# Patient Record
Sex: Male | Born: 1950 | Race: White | Hispanic: No | Marital: Married | Smoking: Never smoker
Health system: Southern US, Community
[De-identification: ages and names within clinical notes are randomized; demographics above are authoritative.]

## PROBLEM LIST (undated history)

## (undated) DIAGNOSIS — F329 Major depressive disorder, single episode, unspecified: Secondary | ICD-10-CM

## (undated) DIAGNOSIS — I340 Nonrheumatic mitral (valve) insufficiency: Secondary | ICD-10-CM

## (undated) DIAGNOSIS — M542 Cervicalgia: Secondary | ICD-10-CM

## (undated) DIAGNOSIS — D649 Anemia, unspecified: Secondary | ICD-10-CM

## (undated) DIAGNOSIS — K589 Irritable bowel syndrome without diarrhea: Secondary | ICD-10-CM

## (undated) DIAGNOSIS — K219 Gastro-esophageal reflux disease without esophagitis: Secondary | ICD-10-CM

## (undated) DIAGNOSIS — A419 Sepsis, unspecified organism: Secondary | ICD-10-CM

## (undated) DIAGNOSIS — K449 Diaphragmatic hernia without obstruction or gangrene: Secondary | ICD-10-CM

## (undated) DIAGNOSIS — K649 Unspecified hemorrhoids: Secondary | ICD-10-CM

## (undated) DIAGNOSIS — K648 Other hemorrhoids: Secondary | ICD-10-CM

## (undated) DIAGNOSIS — K2970 Gastritis, unspecified, without bleeding: Secondary | ICD-10-CM

## (undated) DIAGNOSIS — R6521 Severe sepsis with septic shock: Secondary | ICD-10-CM

## (undated) DIAGNOSIS — M109 Gout, unspecified: Secondary | ICD-10-CM

## (undated) DIAGNOSIS — Z952 Presence of prosthetic heart valve: Secondary | ICD-10-CM

## (undated) DIAGNOSIS — D369 Benign neoplasm, unspecified site: Secondary | ICD-10-CM

## (undated) DIAGNOSIS — F32A Depression, unspecified: Secondary | ICD-10-CM

## (undated) DIAGNOSIS — G4730 Sleep apnea, unspecified: Secondary | ICD-10-CM

## (undated) HISTORY — DX: Gastro-esophageal reflux disease without esophagitis: K21.9

## (undated) HISTORY — DX: Other hemorrhoids: K64.8

## (undated) HISTORY — DX: Gastritis, unspecified, without bleeding: K29.70

## (undated) HISTORY — PX: CORONARY ANGIOPLASTY: SHX604

## (undated) HISTORY — DX: Sepsis, unspecified organism: A41.9

## (undated) HISTORY — PX: GALLBLADDER SURGERY: SHX652

## (undated) HISTORY — DX: Depression, unspecified: F32.A

## (undated) HISTORY — DX: Nonrheumatic mitral (valve) insufficiency: I34.0

## (undated) HISTORY — DX: Diaphragmatic hernia without obstruction or gangrene: K44.9

## (undated) HISTORY — DX: Cervicalgia: M54.2

## (undated) HISTORY — DX: Irritable bowel syndrome, unspecified: K58.9

## (undated) HISTORY — DX: Benign neoplasm, unspecified site: D36.9

## (undated) HISTORY — DX: Severe sepsis with septic shock: R65.21

## (undated) HISTORY — DX: Presence of prosthetic heart valve: Z95.2

## (undated) HISTORY — DX: Unspecified hemorrhoids: K64.9

## (undated) HISTORY — DX: Gout, unspecified: M10.9

## (undated) HISTORY — PX: NECK SURGERY: SHX720

## (undated) HISTORY — DX: Anemia, unspecified: D64.9

## (undated) HISTORY — DX: Major depressive disorder, single episode, unspecified: F32.9

---

## 2000-01-08 HISTORY — PX: CARDIAC VALVE REPLACEMENT: SHX585

## 2000-01-08 HISTORY — PX: AORTIC VALVE REPLACEMENT AND MITRAL VALVE REPAIR: SHX5057

## 2005-08-18 ENCOUNTER — Ambulatory Visit: Payer: Self-pay | Admitting: Internal Medicine

## 2006-02-01 ENCOUNTER — Ambulatory Visit: Payer: Self-pay | Admitting: Internal Medicine

## 2006-02-14 ENCOUNTER — Inpatient Hospital Stay (HOSPITAL_COMMUNITY): Admission: AD | Admit: 2006-02-14 | Discharge: 2006-02-27 | Payer: Self-pay

## 2006-02-17 ENCOUNTER — Ambulatory Visit: Payer: Self-pay | Admitting: Internal Medicine

## 2006-02-17 HISTORY — PX: COLONOSCOPY: SHX174

## 2006-02-17 HISTORY — PX: ESOPHAGOGASTRODUODENOSCOPY: SHX1529

## 2006-02-18 ENCOUNTER — Encounter (INDEPENDENT_AMBULATORY_CARE_PROVIDER_SITE_OTHER): Payer: Self-pay | Admitting: Specialist

## 2009-01-07 ENCOUNTER — Encounter: Payer: Self-pay | Admitting: Internal Medicine

## 2009-01-17 DIAGNOSIS — I38 Endocarditis, valve unspecified: Secondary | ICD-10-CM | POA: Insufficient documentation

## 2009-01-17 DIAGNOSIS — E785 Hyperlipidemia, unspecified: Secondary | ICD-10-CM

## 2009-01-17 DIAGNOSIS — Z8719 Personal history of other diseases of the digestive system: Secondary | ICD-10-CM

## 2009-01-17 DIAGNOSIS — M1990 Unspecified osteoarthritis, unspecified site: Secondary | ICD-10-CM | POA: Insufficient documentation

## 2009-01-17 DIAGNOSIS — K219 Gastro-esophageal reflux disease without esophagitis: Secondary | ICD-10-CM

## 2009-01-17 DIAGNOSIS — I10 Essential (primary) hypertension: Secondary | ICD-10-CM | POA: Insufficient documentation

## 2009-01-17 DIAGNOSIS — K589 Irritable bowel syndrome without diarrhea: Secondary | ICD-10-CM

## 2009-01-23 ENCOUNTER — Ambulatory Visit: Payer: Self-pay | Admitting: Internal Medicine

## 2009-01-23 DIAGNOSIS — Z860100 Personal history of colon polyps, unspecified: Secondary | ICD-10-CM | POA: Insufficient documentation

## 2009-01-24 ENCOUNTER — Telehealth (INDEPENDENT_AMBULATORY_CARE_PROVIDER_SITE_OTHER): Payer: Self-pay

## 2009-01-24 ENCOUNTER — Encounter: Payer: Self-pay | Admitting: Internal Medicine

## 2009-01-27 ENCOUNTER — Telehealth (INDEPENDENT_AMBULATORY_CARE_PROVIDER_SITE_OTHER): Payer: Self-pay | Admitting: *Deleted

## 2009-01-28 ENCOUNTER — Encounter: Payer: Self-pay | Admitting: Internal Medicine

## 2009-01-28 ENCOUNTER — Inpatient Hospital Stay (HOSPITAL_COMMUNITY): Admission: AD | Admit: 2009-01-28 | Discharge: 2009-02-03 | Payer: Self-pay | Admitting: General Surgery

## 2009-01-28 ENCOUNTER — Ambulatory Visit: Payer: Self-pay | Admitting: Internal Medicine

## 2009-01-28 HISTORY — PX: COLONOSCOPY: SHX174

## 2009-01-28 HISTORY — PX: ESOPHAGOGASTRODUODENOSCOPY: SHX1529

## 2009-01-29 ENCOUNTER — Encounter (INDEPENDENT_AMBULATORY_CARE_PROVIDER_SITE_OTHER): Payer: Self-pay | Admitting: General Surgery

## 2009-01-30 ENCOUNTER — Encounter (INDEPENDENT_AMBULATORY_CARE_PROVIDER_SITE_OTHER): Payer: Self-pay

## 2009-01-30 ENCOUNTER — Encounter: Payer: Self-pay | Admitting: Internal Medicine

## 2009-02-03 ENCOUNTER — Telehealth (INDEPENDENT_AMBULATORY_CARE_PROVIDER_SITE_OTHER): Payer: Self-pay

## 2009-02-05 ENCOUNTER — Encounter: Payer: Self-pay | Admitting: Internal Medicine

## 2009-02-14 ENCOUNTER — Ambulatory Visit: Payer: Self-pay | Admitting: Pulmonary Disease

## 2009-02-14 ENCOUNTER — Inpatient Hospital Stay (HOSPITAL_COMMUNITY): Admission: AD | Admit: 2009-02-14 | Discharge: 2009-02-27 | Payer: Self-pay | Admitting: Internal Medicine

## 2009-02-21 ENCOUNTER — Ambulatory Visit: Payer: Self-pay | Admitting: Internal Medicine

## 2009-02-21 ENCOUNTER — Encounter (INDEPENDENT_AMBULATORY_CARE_PROVIDER_SITE_OTHER): Payer: Self-pay | Admitting: Internal Medicine

## 2009-02-25 ENCOUNTER — Encounter (INDEPENDENT_AMBULATORY_CARE_PROVIDER_SITE_OTHER): Payer: Self-pay | Admitting: Cardiology

## 2009-03-06 DIAGNOSIS — A419 Sepsis, unspecified organism: Secondary | ICD-10-CM

## 2009-03-06 HISTORY — DX: Sepsis, unspecified organism: A41.9

## 2009-03-11 ENCOUNTER — Encounter: Payer: Self-pay | Admitting: Internal Medicine

## 2009-03-18 ENCOUNTER — Telehealth (INDEPENDENT_AMBULATORY_CARE_PROVIDER_SITE_OTHER): Payer: Self-pay

## 2009-04-02 ENCOUNTER — Ambulatory Visit: Payer: Self-pay | Admitting: Internal Medicine

## 2009-04-02 DIAGNOSIS — Z9089 Acquired absence of other organs: Secondary | ICD-10-CM | POA: Insufficient documentation

## 2009-04-02 DIAGNOSIS — A419 Sepsis, unspecified organism: Secondary | ICD-10-CM | POA: Insufficient documentation

## 2009-04-02 DIAGNOSIS — L02838 Carbuncle of other sites: Secondary | ICD-10-CM

## 2009-04-02 DIAGNOSIS — L02828 Furuncle of other sites: Secondary | ICD-10-CM

## 2009-04-02 DIAGNOSIS — R6521 Severe sepsis with septic shock: Secondary | ICD-10-CM

## 2009-04-02 DIAGNOSIS — Z9889 Other specified postprocedural states: Secondary | ICD-10-CM | POA: Insufficient documentation

## 2009-07-24 ENCOUNTER — Telehealth (INDEPENDENT_AMBULATORY_CARE_PROVIDER_SITE_OTHER): Payer: Self-pay

## 2010-07-28 NOTE — Progress Notes (Signed)
Summary: requesting different PPI  Phone Note Call from Patient   Caller: Patient Summary of Call: Pt called and said he never had the Pantoprazole filled, he kept getting Protonix from his PCP. Now his insurance will cover the Pantoprazole but it has about a $45.00 co-pay. He will check and see if the pharmacy still has the Rx on profile. But he also wanted to know if there was another PPI that he could try that would be OK with him, given his specific problems of the esophagus.  Initial call taken by: Cloria Spring LPN,  July 24, 2009 2:27 PM     Appended Document: requesting different PPI He can call his insurance company to find out which PPI they prefer & how much it will cost him.  Would not use omeprazole bc he is on coumadin.  Appended Document: requesting different PPI Pt informed.  Appended Document: requesting different PPI Pt called back, said he is going with the generic Protonix and he will be getting it at presence from his PCP. He will call if there is any problems.

## 2010-10-02 LAB — PROTIME-INR
INR: 3.3 — ABNORMAL HIGH (ref 0.00–1.49)
Prothrombin Time: 34.9 seconds — ABNORMAL HIGH (ref 11.6–15.2)

## 2010-10-03 LAB — WOUND CULTURE: Culture: NO GROWTH

## 2010-10-03 LAB — BASIC METABOLIC PANEL
BUN: 12 mg/dL (ref 6–23)
BUN: 23 mg/dL (ref 6–23)
BUN: 7 mg/dL (ref 6–23)
BUN: 8 mg/dL (ref 6–23)
BUN: 10 mg/dL (ref 6–23)
BUN: 18 mg/dL (ref 6–23)
CO2: 22 mEq/L (ref 19–32)
CO2: 27 mEq/L (ref 19–32)
CO2: 29 mEq/L (ref 19–32)
CO2: 32 mEq/L (ref 19–32)
CO2: 33 mEq/L — ABNORMAL HIGH (ref 19–32)
CO2: 26 mEq/L (ref 19–32)
Calcium: 8.6 mg/dL (ref 8.4–10.5)
Calcium: 8.6 mg/dL (ref 8.4–10.5)
Calcium: 9.1 mg/dL (ref 8.4–10.5)
Calcium: 8.8 mg/dL (ref 8.4–10.5)
Calcium: 9.9 mg/dL (ref 8.4–10.5)
Chloride: 101 mEq/L (ref 96–112)
Chloride: 103 mEq/L (ref 96–112)
Chloride: 99 mEq/L (ref 96–112)
Creatinine, Ser: 0.75 mg/dL (ref 0.4–1.5)
Creatinine, Ser: 0.9 mg/dL (ref 0.4–1.5)
Creatinine, Ser: 0.73 mg/dL (ref 0.4–1.5)
Creatinine, Ser: 0.81 mg/dL (ref 0.4–1.5)
GFR calc Af Amer: >60 mL/min (ref >60)
GFR calc Af Amer: >60 mL/min (ref >60)
GFR calc Af Amer: >60 mL/min (ref >60)
GFR calc non Af Amer: >60 mL/min (ref >60)
GFR calc non Af Amer: >60 mL/min (ref >60)
GFR calc non Af Amer: >60 mL/min (ref >60)
GFR calc non Af Amer: >60 mL/min (ref >60)
GFR calc non Af Amer: >60 mL/min (ref >60)
GFR calc non Af Amer: >60 mL/min (ref >60)
GFR calc non Af Amer: >60 mL/min (ref >60)
GFR calc non Af Amer: >60 mL/min (ref >60)
Glucose, Bld: 100 mg/dL — ABNORMAL HIGH (ref 70–99)
Glucose, Bld: 106 mg/dL — ABNORMAL HIGH (ref 70–99)
Glucose, Bld: 106 mg/dL — ABNORMAL HIGH (ref 70–99)
Glucose, Bld: 110 mg/dL — ABNORMAL HIGH (ref 70–99)
Glucose, Bld: 92 mg/dL (ref 70–99)
Glucose, Bld: 96 mg/dL (ref 70–99)
Glucose, Bld: 98 mg/dL (ref 70–99)
Glucose, Bld: 102 mg/dL — ABNORMAL HIGH (ref 70–99)
Glucose, Bld: 94 mg/dL (ref 70–99)
Potassium: 3.4 mEq/L — ABNORMAL LOW (ref 3.5–5.1)
Potassium: 3.4 mEq/L — ABNORMAL LOW (ref 3.5–5.1)
Potassium: 3.6 mEq/L (ref 3.5–5.1)
Potassium: 3.8 mEq/L (ref 3.5–5.1)
Potassium: 3.9 mEq/L (ref 3.5–5.1)
Sodium: 137 mEq/L (ref 135–145)
Sodium: 138 mEq/L (ref 135–145)
Sodium: 139 mEq/L (ref 135–145)
Sodium: 141 mEq/L (ref 135–145)
Sodium: 142 mEq/L (ref 135–145)
Sodium: 144 mEq/L (ref 135–145)
Sodium: 143 mEq/L (ref 135–145)

## 2010-10-03 LAB — HEPARIN LEVEL (UNFRACTIONATED)
Heparin Unfractionated: 0.12 IU/mL — ABNORMAL LOW (ref 0.30–0.70)
Heparin Unfractionated: 0.17 IU/mL — ABNORMAL LOW (ref 0.30–0.70)
Heparin Unfractionated: 0.31 IU/mL (ref 0.30–0.70)
Heparin Unfractionated: 1.01 IU/mL — ABNORMAL HIGH (ref 0.30–0.70)

## 2010-10-03 LAB — DIFFERENTIAL
Basophils Absolute: 0 10*3/uL (ref 0.0–0.1)
Basophils Absolute: 0 10*3/uL (ref 0.0–0.1)
Basophils Absolute: 0 10*3/uL (ref 0.0–0.1)
Basophils Absolute: 0 10*3/uL (ref 0.0–0.1)
Basophils Absolute: 0 10*3/uL (ref 0.0–0.1)
Basophils Absolute: 0 10*3/uL (ref 0.0–0.1)
Basophils Relative: 0 % (ref 0–1)
Basophils Relative: 0 % (ref 0–1)
Basophils Relative: 0 % (ref 0–1)
Basophils Relative: 0 % (ref 0–1)
Basophils Relative: 0 % (ref 0–1)
Basophils Relative: 0 % (ref 0–1)
Eosinophils Absolute: 0.2 10*3/uL (ref 0.0–0.7)
Eosinophils Absolute: 0.2 10*3/uL (ref 0.0–0.7)
Eosinophils Relative: 2 % (ref 0–5)
Eosinophils Relative: 3 % (ref 0–5)
Lymphocytes Relative: 15 % (ref 12–46)
Lymphocytes Relative: 18 % (ref 12–46)
Lymphocytes Relative: 30 % (ref 12–46)
Monocytes Absolute: 0.5 10*3/uL (ref 0.1–1.0)
Monocytes Absolute: 0.6 10*3/uL (ref 0.1–1.0)
Monocytes Absolute: 0.7 10*3/uL (ref 0.1–1.0)
Monocytes Relative: 8 % (ref 3–12)
Monocytes Relative: 8 % (ref 3–12)
Neutro Abs: 4.3 10*3/uL (ref 1.7–7.7)
Neutro Abs: 4.3 10*3/uL (ref 1.7–7.7)
Neutro Abs: 5.2 10*3/uL (ref 1.7–7.7)
Neutro Abs: 5.4 10*3/uL (ref 1.7–7.7)
Neutro Abs: 6.3 10*3/uL (ref 1.7–7.7)
Neutrophils Relative %: 66 % (ref 43–77)
Neutrophils Relative %: 67 % (ref 43–77)
Neutrophils Relative %: 74 % (ref 43–77)
Neutrophils Relative %: 78 % — ABNORMAL HIGH (ref 43–77)

## 2010-10-03 LAB — CBC
HCT: 27.3 % — ABNORMAL LOW (ref 39.0–52.0)
HCT: 29.9 % — ABNORMAL LOW (ref 39.0–52.0)
HCT: 31.2 % — ABNORMAL LOW (ref 39.0–52.0)
HCT: 31.4 % — ABNORMAL LOW (ref 39.0–52.0)
HCT: 32.1 % — ABNORMAL LOW (ref 39.0–52.0)
HCT: 32 % — ABNORMAL LOW (ref 39.0–52.0)
HCT: 32.8 % — ABNORMAL LOW (ref 39.0–52.0)
Hemoglobin: 10.2 g/dL — ABNORMAL LOW (ref 13.0–17.0)
Hemoglobin: 10.4 g/dL — ABNORMAL LOW (ref 13.0–17.0)
Hemoglobin: 10.6 g/dL — ABNORMAL LOW (ref 13.0–17.0)
Hemoglobin: 10.7 g/dL — ABNORMAL LOW (ref 13.0–17.0)
Hemoglobin: 11.2 g/dL — ABNORMAL LOW (ref 13.0–17.0)
Hemoglobin: 9.4 g/dL — ABNORMAL LOW (ref 13.0–17.0)
Hemoglobin: 11.4 g/dL — ABNORMAL LOW (ref 13.0–17.0)
MCHC: 33.2 g/dL (ref 30.0–36.0)
MCHC: 33.3 g/dL (ref 30.0–36.0)
MCHC: 33.4 g/dL (ref 30.0–36.0)
MCHC: 33.8 g/dL (ref 30.0–36.0)
MCHC: 34 g/dL (ref 30.0–36.0)
MCHC: 34.9 g/dL (ref 30.0–36.0)
MCHC: 34.8 g/dL (ref 30.0–36.0)
MCHC: 34.9 g/dL (ref 30.0–36.0)
MCHC: 35.2 g/dL (ref 30.0–36.0)
MCHC: 35.5 g/dL (ref 30.0–36.0)
MCHC: 35.7 g/dL (ref 30.0–36.0)
MCHC: 35.8 g/dL (ref 30.0–36.0)
MCV: 85.8 fL (ref 78.0–100.0)
MCV: 85.9 fL (ref 78.0–100.0)
MCV: 86.2 fL (ref 78.0–100.0)
MCV: 86.6 fL (ref 78.0–100.0)
MCV: 87.9 fL (ref 78.0–100.0)
Platelets: 294 10*3/uL (ref 150–400)
Platelets: 345 10*3/uL (ref 150–400)
Platelets: 348 10*3/uL (ref 150–400)
Platelets: 349 10*3/uL (ref 150–400)
Platelets: 383 10*3/uL (ref 150–400)
Platelets: 178 10*3/uL (ref 150–400)
Platelets: 190 10*3/uL (ref 150–400)
Platelets: 195 10*3/uL (ref 150–400)
Platelets: 264 10*3/uL (ref 150–400)
RBC: 3.41 MIL/uL — ABNORMAL LOW (ref 4.22–5.81)
RBC: 3.74 MIL/uL — ABNORMAL LOW (ref 4.22–5.81)
RBC: 3.22 MIL/uL — ABNORMAL LOW (ref 4.22–5.81)
RBC: 3.57 MIL/uL — ABNORMAL LOW (ref 4.22–5.81)
RDW: 13.9 % (ref 11.5–15.5)
RDW: 14.4 % (ref 11.5–15.5)
RDW: 14.6 % (ref 11.5–15.5)
RDW: 14.7 % (ref 11.5–15.5)
RDW: 14.7 % (ref 11.5–15.5)
RDW: 14.9 % (ref 11.5–15.5)
RDW: 13.9 % (ref 11.5–15.5)
RDW: 14 % (ref 11.5–15.5)
RDW: 14 % (ref 11.5–15.5)
RDW: 14 % (ref 11.5–15.5)
RDW: 14.1 % (ref 11.5–15.5)
RDW: 14.2 % (ref 11.5–15.5)
WBC: 11 10*3/uL — ABNORMAL HIGH (ref 4.0–10.5)
WBC: 7.6 10*3/uL (ref 4.0–10.5)
WBC: 9 10*3/uL (ref 4.0–10.5)
WBC: 9.3 10*3/uL (ref 4.0–10.5)

## 2010-10-03 LAB — BLOOD GAS, ARTERIAL
Acid-base deficit: 0.8 mmol/L (ref 0.0–2.0)
Drawn by: 296031
pCO2 arterial: 32.1 mmHg — ABNORMAL LOW (ref 35.0–45.0)
pH, Arterial: 7.46 — ABNORMAL HIGH (ref 7.350–7.450)
pO2, Arterial: 85.7 mmHg (ref 80.0–100.0)

## 2010-10-03 LAB — HEPATIC FUNCTION PANEL
ALT: 27 U/L (ref 0–53)
AST: 16 U/L (ref 0–37)
AST: 29 U/L (ref 0–37)
Albumin: 2.9 g/dL — ABNORMAL LOW (ref 3.5–5.2)
Bilirubin, Direct: 0.1 mg/dL (ref 0.0–0.3)
Total Protein: 6.5 g/dL (ref 6.0–8.3)
Total Protein: 7.9 g/dL (ref 6.0–8.3)

## 2010-10-03 LAB — TYPE AND SCREEN
ABO/RH(D): O POS
Antibody Screen: NEGATIVE

## 2010-10-03 LAB — PROTIME-INR
INR: 2.8 — ABNORMAL HIGH (ref 0.00–1.49)
INR: 3 — ABNORMAL HIGH (ref 0.00–1.49)
INR: 3.2 — ABNORMAL HIGH (ref 0.00–1.49)
INR: 3.3 — ABNORMAL HIGH (ref 0.00–1.49)
INR: 3.6 — ABNORMAL HIGH (ref 0.00–1.49)
INR: 3.7 — ABNORMAL HIGH (ref 0.00–1.49)
INR: 3.7 — ABNORMAL HIGH (ref 0.00–1.49)
INR: 1.1 (ref 0.00–1.49)
INR: 1.4 (ref 0.00–1.49)
INR: 1.7 — ABNORMAL HIGH (ref 0.00–1.49)
INR: 2 — ABNORMAL HIGH (ref 0.00–1.49)
Prothrombin Time: 26.3 seconds — ABNORMAL HIGH (ref 11.6–15.2)
Prothrombin Time: 29.5 seconds — ABNORMAL HIGH (ref 11.6–15.2)
Prothrombin Time: 32.8 seconds — ABNORMAL HIGH (ref 11.6–15.2)
Prothrombin Time: 33.6 seconds — ABNORMAL HIGH (ref 11.6–15.2)
Prothrombin Time: 35.9 seconds — ABNORMAL HIGH (ref 11.6–15.2)
Prothrombin Time: 17.8 seconds — ABNORMAL HIGH (ref 11.6–15.2)
Prothrombin Time: 22.8 seconds — ABNORMAL HIGH (ref 11.6–15.2)

## 2010-10-03 LAB — LACTIC ACID, PLASMA: Lactic Acid, Venous: 1.1 mmol/L (ref 0.5–2.2)

## 2010-10-03 LAB — MAGNESIUM: Magnesium: 2 mg/dL (ref 1.5–2.5)

## 2010-10-03 LAB — D-DIMER, QUANTITATIVE: D-Dimer, Quant: 1.62 ug/mL-FEU — ABNORMAL HIGH (ref 0.00–0.48)

## 2010-10-03 LAB — CARDIAC PANEL(CRET KIN+CKTOT+MB+TROPI)
Relative Index: INVALID (ref 0.0–2.5)
Relative Index: INVALID (ref 0.0–2.5)
Total CK: 13 U/L (ref 7–232)
Total CK: 50 U/L (ref 7–232)
Troponin I: 0.03 ng/mL (ref 0.00–0.06)
Troponin I: 0.04 ng/mL (ref 0.00–0.06)

## 2010-10-03 LAB — CULTURE, BLOOD (ROUTINE X 2)
Culture: NO GROWTH
Culture: NO GROWTH

## 2010-10-03 LAB — COMPREHENSIVE METABOLIC PANEL
Albumin: 3.6 g/dL (ref 3.5–5.2)
BUN: 8 mg/dL (ref 6–23)
Creatinine, Ser: 0.8 mg/dL (ref 0.4–1.5)
Glucose, Bld: 107 mg/dL — ABNORMAL HIGH (ref 70–99)
Total Protein: 7.6 g/dL (ref 6.0–8.3)

## 2010-10-03 LAB — PHOSPHORUS: Phosphorus: 3.6 mg/dL (ref 2.3–4.6)

## 2010-10-03 LAB — ABO/RH: ABO/RH(D): O POS

## 2010-10-03 LAB — VANCOMYCIN, TROUGH
Vancomycin Tr: 24.1 ug/mL — ABNORMAL HIGH (ref 10.0–20.0)
Vancomycin Tr: 8.7 ug/mL — ABNORMAL LOW (ref 10.0–20.0)

## 2010-10-03 LAB — BRAIN NATRIURETIC PEPTIDE: Pro B Natriuretic peptide (BNP): 836 pg/mL — ABNORMAL HIGH (ref 0.0–100.0)

## 2010-10-03 LAB — APTT: aPTT: 33 seconds (ref 24–37)

## 2010-11-10 NOTE — Discharge Summary (Signed)
Lucas Bowman, Lucas Bowman             ACCOUNT NO.:  [id number]   MEDICAL RECORD NO.:  [id number]          PATIENT TYPE:  INP   LOCATION:  4710                         FACILITY:  [facility]   PHYSICIAN:  Altha Harm, MDDATE OF BIRTH:  September 13, 1950   DATE OF ADMISSION:  02/14/2009  DATE OF DISCHARGE:  02/27/2009                               DISCHARGE SUMMARY   ADDENDUM   The patient's discharge was held yesterday as the home health agency was  unable to provide IV infusion services for his IV antibiotics last  night.  This has been arranged to occur today and the patient is being  discharged home on vancomycin and rifampin for an additional 7 days.   1. Chronic anticoagulation.  The patient's INR today is 3.3 and the      patient will take 5 mg of Coumadin tonight and have his INR checked      tomorrow.  He is to follow up with Dr. Kathlee Nations in [place],      his primary care physician, for an INR and further titration of his      Coumadin.  Otherwise the patient has remained stable.  Change in      antibiotics is as follows:  Rifampin 600 mg p.o. daily x7 days.  2. Vancomycin 1250 mg IV q.12 h. x7 days to be adjusted based on      vancomycin levels.  3. Coumadin 5 mg p.o. x1 tonight and check an INR on February 28, 2009      and follow up with Dr. Maryellen Pile for further titration of Coumadin.   Change in medications include the following:  The patient was originally  being discharged on Zofran 4 mg every 8 hours as needed for nausea.  This is now been changed to Phenergan 25 mg p.o. q.6 h. p.r.n. nausea.  Otherwise, the discharge medications remain the same.   FOLLOWUP:  As noted, except that the patient will follow up with Dr.  Kathlee Nations on February 28, 2009 at the phone number [phone].  I  will call Dr. Jake Church office and make him aware of the change in dose  and the current INR.      Altha Harm, MD  Electronically Signed     MAM/MEDQ  D:  02/27/2009  T:   02/27/2009  Job:  [id number]   cc:   Kathlee Nations

## 2010-11-10 NOTE — Discharge Summary (Signed)
NAMEDIAGO, HAIK             ACCOUNT NO.:  [id number]   MEDICAL RECORD NO.:  [id number]          PATIENT TYPE:  INP   LOCATION:  4710                         FACILITY:  [facility]   PHYSICIAN:  Altha Harm, MDDATE OF BIRTH:  1951/01/30   DATE OF ADMISSION:  02/14/2009  DATE OF DISCHARGE:  02/26/2009                               DISCHARGE SUMMARY   ADDENDUM:  Please refer to the discharge summary dictated on less than  24 hours ago, February 25, 2009 for details of the hospital course to that  point.  This addendum in regards to the patient's TEE and findings  regarding that.  The patient underwent a transesophageal echocardiogram  yesterday to rule out endocarditis.  The findings showed no evidence of  vegetation on any of the valve or appendages.  The impression was that  the vegetation is absent.   In addition, the TEE also confirmed that the patient has normal systolic  function with an ejection fraction ranging in the range of 60-65%, and  there were no wall motion abnormalities.  The 2-D echocardiogram from  February 21, 2009 did show that the patient has findings consistent with  left ventricular hypertrophy and Lasix was added to the patient's  regimen of medications.   Mitral valve replacement in the past.  The patient does have a St. Jude  valve and is on chronic Coumadin therapy.  On today, his INR is 3.5.  The patient will be discharged home and his PT/INR will be checked in  the morning by home health nursing and the result, called to  [facility] for adjustment of his  Coumadin at that time.   In terms of discharge medications.  The patient is being discharged on,  1. Lopid 600 mg p.o. b.i.d.  2. Allopurinol 100 mg p.o. b.i.d.  3. Hydrocodone 10/325 one tablet p.o. q.6 h. p.r.n.  4. Diazepam 5 mg p.o. at bedtime.  5. Protonix 40 mg p.o. daily.  6. Lasix 40 mg p.o. daily.  7. Coumadin 2.5 mg x1 tonight and check the INR on  February 27, 2009      and report findings to [facility] who will      adjust the Coumadin accordingly.  8. Rifampin 600 mg p.o. daily x8 days.  9. Vancomycin 1250 mg IV q.12 h. x8 days.  Home health Duolabs per      vancomycin protocol.  10.Anusol Suppository applied per rectally q.12 hours.  11.Lomotil one to two tablets p.o. q.6 h. p.r.n.   DIETARY RESTRICTIONS:  The patient should be on a 2-g sodium, low-  cholesterol diet.   PHYSICAL RESTRICTIONS:  Activity as tolerated.   FOLLOW UP:  The patient is to follow up with the ID clinic on March 27, 2009 at 11 a.m. with Dr. Orvan Falconer.      Altha Harm, MD  Electronically Signed     MAM/MEDQ  D:  02/26/2009  T:  02/27/2009  Job:  [id number]

## 2010-11-10 NOTE — Discharge Summary (Signed)
NAMERANDOLF, SANSOUCIE             ACCOUNT NO.:  [id number]   MEDICAL RECORD NO.:  [id number]          PATIENT TYPE:  INP   LOCATION:  2920                         FACILITY:  [facility]   PHYSICIAN:  Altha Harm, MDDATE OF BIRTH:  Jun 14, 1951   DATE OF ADMISSION:  02/14/2009  DATE OF DISCHARGE:  02/26/2009                               DISCHARGE SUMMARY   ADDENDUM:  This is an addendum to a discharge summary which timed out on  patient Kiondre Grenz.  Correction to prior dictation.  The patient  will follow up with his primary care physician, Dr. Kathlee Nations, phone  number [phone] in [place], [place] for adjustment of  Coumadin.  The patient will have his Coumadin checked by home health  tomorrow and the results called to Dr. Maryellen Pile at [phone] for  adjustment of the Coumadin.   ADDITIONAL MEDICATIONS:  1. Questran 4 g p.o. t.i.d.  2. Zofran 4 mg p.o. q.8 hours p.r.n. nausea.  3. Nystatin cream applied topically to affected area daily.   FOLLOW UP:  The patient will follow up with his primary care Carmisha Larusso,  Dr. Maryellen Pile, in 1 week or as needed beforehand.  Please note that Lotensin  is discontinued and this is discontinued due to the patient's  normotensive state without medications.  The patient will need to have  his blood pressures checked and medications adjusted as warranted.  The  patient also with followup with Dr. Orvan Falconer in the ID clinic on  March 27, 2009 at 11 a.m. and will follow up with Dr. Lynnea Ferrier with  [facility] in 2 weeks on March 13, 2009 at  11:15 in the [facility].      Altha Harm, MD  Electronically Signed     MAM/MEDQ  D:  02/26/2009  T:  02/26/2009  Job:  [id number]

## 2010-11-10 NOTE — Discharge Summary (Signed)
NAMEJAGJIT, Lucas Bowman             ACCOUNT NO.:  [id number]   MEDICAL RECORD NO.:  [id number]          PATIENT TYPE:  INP   LOCATION:  2920                         FACILITY:  [facility]   PHYSICIAN:  Hillery Aldo, M.D.   DATE OF BIRTH:  01/11/1951   DATE OF ADMISSION:  02/14/2009  DATE OF DISCHARGE:                               DISCHARGE SUMMARY   PRIMARY CARE PHYSICIAN:  Jeralyn Ruths, M.D. in [place], [place].   CURRENT DIAGNOSES:  1. Acute congestive heart failure.  2. 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  3. Sepsis/septic shock in the setting of a laparoscopic      cholecystectomy on January 29, 2009.  4. 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  5. Methicillin resistant Staphylococcus aureus skin infection      involving the skin of the upper back.  6. Hypokalemia.  7. Peri rectal candidiasis  8. Rectal hemorrhoids.  9. History of gout.  10.Diarrhea.  11.History of irritable bowel syndrome.  12.Gastroesophageal reflux disease.   DISCHARGE MEDICATIONS:  Discharge medications will be dictated at the  time of discharge.   CONSULTATIONS:  1. Dr. Coralyn Helling of pulmonary critical care medicine.  2. Dr. Ritta Slot of cardiology.  3. Dr. Cliffton Asters of infectious diseases.   BRIEF ADMISSION AND HISTORY OF PRESENT ILLNESS:  The patient is a 60-  year-old male who underwent a laparoscopic cholecystectomy 2 weeks prior  to presentation at [facility].  He subsequently developed  fever, chills, and a rash on his back approximately 2 weeks post surgery  and presented to [facility] where he was noted to be hypotensive.  There was some concern for septic shock and the patient was subsequently  transferred to [facility] under the care of the pulmonary critical care  team.  He was transferred to the hospitalist service after being  stabilized on February 15, 2009.  For the full details, please see the  chart.   PROCEDURES AND DIAGNOSTIC STUDIES:  1. Two views of the abdomen on February 18, 2009 showed no acute      findings.  2. Two views of the chest on February 18, 2009 showed right greater than      left basilar atelectasis.  Early pneumonia in the right base could      not be excluded.  3. Chest x-ray on February 19, 2009 showed bilateral increase in      pulmonary edema versus infiltrates.  4. CT angiogram of the chest on February 19, 2009 showed no evidence of      acute pulmonary embolism.  Bilateral pleural effusions, basilar      atelectasis, and mild pulmonary edema.  Subcutaneous nodule in the      subxiphoid fat.  5. Chest x-ray on February 20, 2009 showed vascular congestion and      pulmonary edema persisting.  No significant change.  6. Two-dimensional echocardiogram on February 21, 2009 showed a normal      left ventricle  cavity size.  Wall thickness was increased in a      pattern of mild LVH.  Systolic function was normal with an      estimated ejection fraction of 55-60%.  No regional wall motion      abnormalities.  There was thickening in the region of the      prosthesis of the St. Jude aortic valve.  The common annulus      extending into the anterior region on MB was thickened.      Recommendations were to perform a TEE to better elucidate and rule      out endocarditis.  7. Chest x-ray on February 23, 2009 showed improving aeration of both      lungs compatible with decreasing pulmonary edema.  8. Chest x-ray on February 24, 2009 showed right arm PICC placement to      the mid/low SVC.   DISCHARGE LABORATORY DATA:  Will be dictated at the time of actual  discharge.   HOSPITAL COURSE BY PROBLEM:  1. Sepsis/septic shock:  The patient was initially admitted to the      critical care team and put on vancomycin and Zosyn.  Blood cultures      were obtained on February 14, 2009 and subsequently found to be      negative.   However, the patient was on antibiotic therapy with      Cipro when these cultures were done.  The patient's blood pressure      responded to IV fluid hydration and empiric antibiotics and he      subsequently was transferred to the care of the hospitalist seen.      He was maintained on vancomycin and because of concerns for      possible endocarditis, infectious disease consultation was      requested and kindly provided by Dr. Orvan Falconer.  Rifampin was      ultimately added to his medication regimen and at this time, we are      attempting to rule out an infective endocarditis involving his      prosthetic valve.  He is hemodynamically stable.  2. Acute congestive heart failure:  The patient's heart failure was      very concerning for prosthetic valve endocarditis.  He has normal      systolic function based on transthoracic echocardiogram.  The      patient is scheduled to undergo a transesophageal echocardiogram      for better elucidation and confirmation of endocarditis.  3. 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  His INR has      been therapeutic.  4. Hypokalemia:  The patient was appropriately repleted.  5. Perirectal candidiasis/hemorrhoids:  The patient has been receiving      symptomatic treatment with nystatin,  hydrocortisone cream,      hydrocortisone suppositories.  6. Gout:  The patient has been stable on allopurinol.  7. Diarrhea/history of irritable bowel syndrome:  The patient has been      maintained on Questran and p.r.n. Lomotil.  He continues to have      frequent loose stools.  8. Gastroesophageal reflux disease:  The patient has been maintained      on proton pump inhibitor therapy.  9. Methicillin resistant Staphylococcus aureus abscess:  The patient  did have an abscess to the back that was cultured and ultimately       grew MRSA.  This may have been the source of seeding of his aortic      valve.  At this point, the plan is to continue vancomycin for      prolonged period of time and as such, a peripherally inserted      central catheter has been inserted for prolonged antibiotic therapy      with vancomycin and rifampin.  He will likely need 6 weeks of      treatment.   DISPOSITION:  The patient will be discharged home when medically stable.  A discharge summary addendum will be dictated at that time.      Hillery Aldo, M.D.  Electronically Signed     CR/MEDQ  D:  02/25/2009  T:  02/25/2009  Job:  [id number]   cc:   Jeralyn Ruths, M.D.

## 2010-11-10 NOTE — Op Note (Signed)
NAMESAYER, MASINI             ACCOUNT NO.:  [id number]   MEDICAL RECORD NO.:  [id number]          PATIENT TYPE:  INP   LOCATION:  A327                          FACILITY:  [facility]   PHYSICIAN:  R. Roetta Sessions, M.D. DATE OF BIRTH:  06-Nov-1950   DATE OF PROCEDURE:  01/28/2009  DATE OF DISCHARGE:                               OPERATIVE REPORT   PROCEDURE PERFORMED:  Diagnostic esophagogastroduodenoscopy followed by  colonoscopy and snare polypectomy.   INDICATIONS FOR PROCEDURE:  Patient is a 60 year old gentleman with a  longstanding esophageal reflux disease, symptoms of dyspepsia.  He has  recently been found to have cholelithiasis, which in part is felt be  contributing to some his upper GI tract symptoms.  He has been taking  ranitidine with suboptimal control of his reflux symptoms.  He has a  history of tubulovillous adenoma, positive family history of colon  cancer and has had some intermittent low-volume hematochezia.  He has  been admitted to Dr. Ernie Avena service in preparation for  cholecystectomy tomorrow.  Prior cholecystectomy, the plan is for an EGD  and colonoscopy while he is here as he has come off Coumadin and he has  been bridged with Lovenox through yesterday.  This approach has  discussed with Mr. Rehfeld at length previously and again today at the  bedside.  The risks, benefits, alternatives and limitations have been  discussed.  Please see documentation in the medical record.   PROCEDURE NOTE:  O2 saturation, blood pressure, pulse, respirations were  monitored throughout entire procedure.  Conscious sedation Versed 10 mg  IV, Demerol 150 mg IV in divided doses.  Cetacaine spray for topical  pharyngeal anesthesia.  Instrument Pentax video chip system.   EGD FINDINGS:  Examination of the tubular esophagus revealed 2-3 mm  circumferential distal esophageal erosions.  There was no Barrett's  esophagus or other abnormality.  EG junction easily traversed.  Stomach:  Gastric cavity was empty.  It insufflated well with air.  Thorough examination of the gastric mucosa including retroflexion in the  proximal stomach and esophagogastric junction demonstrated only a small  hiatal hernia.  Pylorus patent, easily traversed.  Examination of bulb  and second portion revealed no abnormalities.   THERAPEUTIC/DIAGNOSTIC MANEUVERS PERFORMED:  None.   The patient tolerated procedure well, was prepared for colonoscopy.   Digital rectal exam revealed no abnormalities.   ENDOSCOPIC FINDINGS:  Prep was adequate.  Colon:  Colonic mucosa was surveyed from the rectosigmoid junction  through the left, transverse and right colon to the area of the  appendiceal orifice, ileocecal valve and cecum.  These structures were  well seen and photographed for the record.  From this level scope was  slowly withdrawn.  All previously mentioned mucosal surfaces were again  seen.  On the way in, the patient had a 5-mm pedunculated polyp in the  mid sigmoid.  It was hot snared, removed and recovered through the  scope.  Thorough examination of colonic mucosa was undertaken on the way  out and the colonic mucosa otherwise appeared normal.  The scope was  pulled down in the rectum.  A thorough examination of the rectal mucosa  including retroflex view of the anal verge demonstrated no  abnormalities.  The patient tolerated the procedure well.  Cecal  withdrawal time 7 minutes.   IMPRESSION:  1. Tiny distal esophageal erosions consistent with mild erosive reflux      esophagitis.  2. Small hiatal hernia otherwise.  Upper GI tract appeared normal.   COLONOSCOPY FINDINGS:  1. Internal hemorrhoids, otherwise normal rectum.  2. Sigmoid polyp, status post hot snare polypectomy.  Remainder of      colonic mucosa appeared normal.  I suspect the patient has bled      from hemorrhoids recently in a setting of anticoagulation.   RECOMMENDATIONS:  1. Protonix 40 grams orally daily.   Stop using Zantac.  2. No aspirin or arthritis medications for 5 days.  3. Follow-up on path.  4. Anusol-HC suppositories one per rectum at bedtime.  5. Will resume heparin drip as a bridging maneuver at 1800 today and      will stop at the appropriate time prior to cholecystectomy per Dr.      Lovell Sheehan.      Jonathon Bellows, M.D.  Electronically Signed     RMR/MEDQ  D:  01/28/2009  T:  01/28/2009  Job:  [id number]   cc:   Hassie Bruce  Fax: [phone]   Kathlee Nations  Fax: [phone]

## 2010-11-10 NOTE — Discharge Summary (Signed)
Lucas Bowman, Lucas Bowman             ACCOUNT NO.:  [id number]   MEDICAL RECORD NO.:  [id number]          PATIENT TYPE:  INP   LOCATION:  A327                          FACILITY:  [facility]   PHYSICIAN:  Dalia Heading, M.D.  DATE OF BIRTH:  1950/11/14   DATE OF ADMISSION:  01/28/2009  DATE OF DISCHARGE:  08/09/2010LH                               DISCHARGE SUMMARY   HOSPITAL COURSE SUMMARY:  The patient is a 60 year old white male who  presented with biliary colic secondary to cholelithiasis.  he also had a  history of colon polyps.  He was status post aortic valve replacement in  the remote past and was taken off his Coumadin just prior to admission.  He was admitted to hospital on January 28, 2009.  He underwent an EGD and  colonoscopy with polypectomy by Dr. Jena Gauss.  He was then placed on a  heparin drip which was subsequently stopped just prior to his surgery.  He underwent laparoscopic cholecystectomy on January 29, 2009.  He  tolerated the procedure well.  His postoperative course has been  unremarkable.  He has developed some ecchymosis around the lower part of  his abdomen which was not unexpected due to his need for heparin  therapy.  He was started on Coumadin along with pharmacy consultation.  On day of discharge, his INR was 2.  He is being discharged home in good  improving condition.   DISCHARGE INSTRUCTIONS:  The patient is to follow up with Dr. Franky Macho on February 06, 2009.  He is to follow up with his primary care  physician for an INR check on February 05, 2009.   Discharge medications include:  1. Gemfibrozil 600 mg p.o. b.i.d.  2. Allopurinol 100 mg p.o. b.i.d.  3. Prilosec 40 mg p.o. daily.  4. Coumadin 7.5-5 mg alternating daily.  5. Benazepril 20 mg p.o. daily.  6. Percocet 1-2 tablets p.o. q.4 h. p.r.n. pain.  7. Valium 5 mg p.o. at bedtime p.r.n.  8. Flexeril 10 mg p.o. at bedtime.   PRINCIPAL DIAGNOSES:  1. Cholecystitis, cholelithiasis.  2. Colon polyps.  3.  Chronic anticoagulation.  4. History of gout.  5. Hypertension.  6. Coronary artery disease.  7. GERD   PRINCIPAL PROCEDURE:  1. EGD, Colonoscopy with polypectomy by Dr. Roetta Sessions on January 28, 2009.  2. Laparoscopic cholecystectomy by Dr. Franky Macho on January 29, 2009.      Dalia Heading, M.D.  Electronically Signed     MAJ/MEDQ  D:  02/03/2009  T:  02/03/2009  Job:  [id number]   cc:   R. Roetta Sessions, M.D.  [id number]  [place]  [place] [id number]

## 2010-11-10 NOTE — Group Therapy Note (Signed)
NAMEAPOSTOLOS, BLAGG             ACCOUNT NO.:  [id number]   MEDICAL RECORD NO.:  [id number]          PATIENT TYPE:  INP   LOCATION:  5525                         FACILITY:  [facility]   PHYSICIAN:  Michelene Gardener, MD    DATE OF BIRTH:  1950-06-30                                 PROGRESS NOTE   DATE OF DISCHARGE:  To be determined.   DISCHARGE DIAGNOSES:  Includes:  1. Septic shock secondary to cellulitis.  2. Upper back cellulitis.  3. Abdominal distention, to rule out obstruction.  4. Diarrhea, to rule out Clostridium difficile colitis.  5. 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  6. Brief episode of rectal bleed secondary to hemorrhoids.  7. Hypertension.  8. Hyperlipidemia.  9. Gastroesophageal reflux disease.  10.Irritable bowel syndrome.  11.History of gout.  12.History of coronary artery disease.  13.History of recent cholecystectomy.   DISCHARGE MEDICATIONS:  To be determined by discharging physician.   CONSULTATIONS:  1. This patient was under the service of Critical Care and was      transferred to Medicine on February 15, 2009.   PROCEDURES:  None.   DIAGNOSTIC STUDIES:  1. Chest x-ray on January 28, 2009 showed no acute problems.  2. Abdominal x-ray on February 18, 2009, results are pending.  3. Chest x-ray on February 18, 2009, results are pending.   COURSE OF HOSPITALIZATION.:  1. Septic shock.  This patient was admitted initially to [facility], where he was found to be hypotensive and was diagnosed      with septic shock.  The patient was sent to [facility] for      further evaluation.  He was admitted to the Intensive Care Unit on      February 14, 2009 and was followed by CCM overnight and then was      transferred to Medicine the next day.  Initially he was started on      vasopressors, IV fluids, IV antibiotics that included vancomycin      and Avelox.  When I saw him,  he was doing better.  He was already      off the vasopressors and his blood pressure was doing well.  Blood      cultures were followed and they came to be normal.  His cultures      from the site of cellulitis grew Staph aureus and currently      sensitivity is pending.  Overall his septic shock is improving.      His blood pressure remains fine.  Blood pressure medications were      restarted.  2. Back cellulitis.  That was started as an outpatient, was evaluated      by his primary doctor, where a culture was taken from the wound and      his cultures from the office of his doctor came positive for MRSA,      so the patient was put in contact isolation.  Blood cultures taken  here in the hospital came to be negative x2.  Back cultures taken      here in the hospital grew Staph aureus and final culture and      sensitivity are pending at this time.  As mentioned above, the      patient is still on antibiotics and his antibiotics will be      adjusted according to sensitivity.  3. Abdominal distention.  The patient developed abdominal distention      on February 18, 2009.  I ordered an abdominal x-ray today to rule out      obstruction.  4. Diarrhea.  This patient normally has diarrhea alternating with      constipation secondary to his irritable bowel syndrome.  He noticed      last night and today he had more episodes of diarrhea and that will      be concerning with the antibiotics that he is taking for possible      C. diff.  I will send a stool C. diff today and then will direct      management according to results.  5. History of aortic valve replacement/mitral valve replacement.  The      patient is on chronic Coumadin.  Dose has been adjusted by      pharmacy.  Today his INR is 4.1.  Will continue to dose Coumadin by      pharmacy.  His goal INR is 2.5-3.5.  6. Rectal bleed.  While in the hospital he developed small streaks of      blood in his stool, which he mentioned that  he gets from time to      time secondary to hemorrhoids.  The patient had both an EGD and      colonoscopy around 2 weeks ago.  Our findings only showed polyps      and he was cleared by GI at that time to continue Coumadin.  7. Hypertension that had remained within good control after      discontinuation of the vasopressors.  Lotensin was started and his      blood pressure held fine.  8. GERD.  He was continued on PPI.   This patient improved very well.  Came out of vasopressors.  IV fluids  discontinued.  He will be monitored for today.  We will follow his  abdominal x-ray and his chest x-ray, the stool for C. diff and the final  results of culture and sensitivity from his back wound.  It will be  possible to go home within 1 or 2 days depending on the pending results.   TOTAL ASSESSMENT TIME:  40 minutes.  An updated discharge summary and an updated addendum will  be added by the discharging physician.      Michelene Gardener, MD  Electronically Signed     NAE/MEDQ  D:  02/18/2009  T:  02/18/2009  Job:  [id number]

## 2010-11-10 NOTE — H&P (Signed)
Lucas Bowman, Lucas Bowman             ACCOUNT NO.:  [id number]   MEDICAL RECORD NO.:  [id number]          PATIENT TYPE:  INP   LOCATION:  A327                          FACILITY:  [facility]   PHYSICIAN:  Dalia Heading, M.D.  DATE OF BIRTH:  [birth year]   DATE OF ADMISSION:  01/28/2009  DATE OF DISCHARGE:  LH                              HISTORY & PHYSICAL   CHIEF COMPLAINT:  Cholecystitis, cholelithiasis, history of colon  polyps.   HISTORY OF PRESENT ILLNESS:  The patient is a 60 year old white male who  presents with biliary colic secondary to cholelithiasis.  He also has a  history of colon polyps.  He is followed by Dr. Jena Gauss of  Gastroenterology.  He has been having intermittent right upper quadrant  abdominal pain, nausea, vomiting for the past few weeks.  Ultrasound of  the gallbladder reveals cholelithiasis with a normal common bile duct.  The patient is status post an aortic valve replacement in 2001 and is on  chronic Coumadin therapy.   The patient is being admitted for IV heparin therapy prior to undergoing  a cholecystectomy.  He has been bridged on Lovenox and thus will also  receive a colonoscopy and EGD by Dr. Jena Gauss as he would be off his  Coumadin.   PAST MEDICAL HISTORY:  As noted above.  1. Coronary artery disease.  2. Gout.  3. Hypertension.  4. High cholesterol levels.   PAST SURGICAL HISTORY:  1. Aortic valve replacement with mitral valve repair in 2001.  2. Right orchiectomy in 1991.  3. Cervical diskectomy in 1991 and 1998.  4. Fissurectomy in 2000.  5. Kidney stone removal in the past.   CURRENT MEDICATIONS:  1. Coumadin 5/7.5 mg alternating daily.  2. Benazepril/hydrochlorothiazide 20 mg p.o. daily.  3. Gemfibrozil 600 mg p.o. b.i.d.  4. Allopurinol 100 mg p.o. b.i.d.  5. Ranitidine 150 mg p.o. b.i.d.  6. Dicyclomine 100 mg p.o. t.i.d.  7. Hydrocodone p.r.n. pain.   ALLERGIES:  SULFA.   REVIEW OF SYSTEMS:  The patient denies drinking or smoking.   Denies any  other cardiopulmonary difficulties.   PHYSICAL EXAMINATION:  GENERAL:  The patient is a pleasant 60 year old  white male in no acute distress.  HEENT: Examination reveals no scleral icterus.  LUNGS:  Clear to auscultation with equal breath sounds bilaterally.  HEART:  Examination reveals regular rate and rhythm with a metallic S1  heard.  No murmurs are noted.  ABDOMEN:  Soft with slight tenderness in the right upper quadrant to  palpation.  No hepatosplenomegaly, masses, hernias are identified.   IMPRESSION:  1. Cholecystitis, cholelithiasis.  2. History of colon polyps.  3. Chronic anticoagulation status post aortic valve replacement with      St. Jude's heart valve.  4. Hypertension.  5. History of gout.   PLAN:  The patient will be admitted to the hospital for IV heparin  therapy prior to surgery.  He will also undergo a colonoscopy and EGD by  Dr. Jena Gauss.  The risks and benefits of the procedure including bleeding,  infection, hepatobiliary injury and the possibly of an  open procedure  were fully explained to the patient, gave informed consent.      Dalia Heading, M.D.  Electronically Signed     MAJ/MEDQ  D:  01/28/2009  T:  01/28/2009  Job:  [id number]   cc:   R. Roetta Sessions, M.D.  [id number]  [place]  [place] [address]   Dalia Heading, M.D.  Fax: [phone]

## 2010-11-10 NOTE — Op Note (Signed)
Lucas Bowman, Lucas Bowman             ACCOUNT NO.:  [id number]   MEDICAL RECORD NO.:  [id number]          PATIENT TYPE:  INP   LOCATION:  A327                          FACILITY:  [facility]   PHYSICIAN:  Dalia Heading, M.D.  DATE OF BIRTH:  1951/02/07   DATE OF PROCEDURE:  01/29/2009  DATE OF DISCHARGE:                               OPERATIVE REPORT   PREOPERATIVE DIAGNOSES:  Cholecystitis, cholelithiasis.   POSTOPERATIVE DIAGNOSES:  Cholecystitis, cholelithiasis.   PROCEDURE:  Laparoscopic cholecystectomy.   SURGEON:  Dalia Heading, MD   ANESTHESIA:  General endotracheal.   INDICATIONS:  The patient is a 60 year old, white male, who was admitted  yesterday for heparin therapy due to a history of St. Jude's aortic  valve replacement, who presents for laparoscopic cholecystectomy due to  acute cholecystitis, cholelithiasis.  The risks and benefits of the  procedure including bleeding, infection, hepatobiliary, the possibility  of an open procedure were fully explained to the patient, gave informed  consent.   PROCEDURE NOTE:  The patient was placed in supine position.  After  induction of general endotracheal anesthesia, the abdomen was prepped  and draped using the usual sterile technique with Betadine.  Surgical  site confirmation was performed.   A supraumbilical incision was made down to the fascia.  Veress needle  was introduced into the abdominal cavity and confirmation of placement  was done using the saline drop test.  The abdomen was then insufflated  to 16 mmHg pressure.  An 11-mm trocar was introduced into the abdominal  cavity under direct visualization without difficulty.  The patient was  placed in reversed Trendelenburg position.  An additional 11-mm trocar  was placed in the epigastric region and 5-mm trocar was placed in the  right upper quadrant, right flank regions.  The liver was inspected and  noted to be within normal limits.  The gallbladder was retracted  superiorly and laterally.  The dissection was begun around the  infundibulum of the gallbladder.  The cystic duct was first identified.  The junction to the infundibulum fully identified.  Endoclips placed  proximally and distally on the cystic duct and the cystic duct was  divided.  This likewise was done on the cystic artery.  The gallbladder  was then freed away from the gallbladder fossa using Bovie  electrocautery.  The gallbladder was delivered through the epigastric  trocar site using EndoCatch bag.  The gallbladder fossa was inspected  and no abnormal bleeding or bile leakage was noted.  Surgicel was placed  to the gallbladder fossa.  All fluid and air were then evacuated from  the abdominal cavity prior to removal of the trocars.   All wounds were irrigated with normal saline.  All wounds were injected  with 0.5 cm Sensorcaine.  The supraumbilical fascia as well as  epigastric fascia reapproximated using O Vicryl interrupted sutures.  All skin incisions were closed using staples.  Betadine ointment and dry  sterile dressings were applied.   All tape and needle counts were correct at the end of the procedure.  The patient was extubated in the operating room and  went back to the  recovery room awake in stable condition.   COMPLICATIONS:  None.   SPECIMEN:  Gallbladder with stones.   BLOOD LOSS:  Minimal.      Dalia Heading, M.D.  Electronically Signed     MAJ/MEDQ  D:  01/29/2009  T:  01/30/2009  Job:  [id number]   cc:   R. Roetta Sessions, M.D.  [address]  [place]  [place] [address]

## 2010-11-13 NOTE — Consult Note (Signed)
NAME:  Lucas Bowman, Lucas Bowman                 ACCOUNT NO.:  0   MEDICAL RECORD NO.:  0                  PATIENT TYPE:   LOCATION:                                 FACILITY:   PHYSICIAN:  R. Roetta Sessions, M.D. DATE OF BIRTH:  05/09/1951   DATE OF CONSULTATION:  08/18/2005  DATE OF DISCHARGE:                                   CONSULTATION   REFERRING PHYSICIAN:  Kathlee Nations, M.D.   CHIEF COMPLAINT:  Intermittent low volume hematochezia, needs colonoscopy.  Positive family history of colon cancer.   HISTORY OF PRESENT ILLNESS:  Mr. Lucas Bowman is a pleasant, 60 year old  gentleman known to me from prior evaluation who came to see me today  requesting a colonoscopy.  He tells me he has had a little bit of low volume  hematochezia off and on for the past couple of years, but he has had other  health problems and has put off GI evaluation until now.   This gentleman tells me he does have intermittent paper hematochezia  occasionally when he wipes.  He denies constipation.  He has a prior  diagnosis of diarrhea with predominant IBS and diarrhea is an occasional  problem for him.  Mr. Hepp did make a contact with [place] back in 2005, and  plans were being made at that time to perform a high-risk screening  colonoscopy, however, he did not follow through.   I last saw this nice gentleman on September 17, 1999.  We know that he had a  colonoscopy back in 1999, and only diverticulosis was found.  He underwent a  flexible sigmoidoscopy in 2001, because of recurrent rectal bleeding.  He  ultimately underwent an internal sphincterotomy and fissure resection by Dr.  Josefina Do up in [place].   He has gained 18 pounds since last being seen here in 2001.   He has had significant medical problems including critical valvular heart  disease for which he underwent open heart surgery at [facility].  He  couple of years ago, he was found to have abdominal aortic aneurysm.  He  underwent a graft of  his ascending aorta as well as placement of St. Jude  valve in the aortic position and mitral valve repair.  He is now on chronic  Coumadin therapy.  He does have occasional gastroesophageal reflux disease  well-controlled on OTC ranitidine.   PAST MEDICAL HISTORY:  1.  Diarrhea.  2.  Predominant IBS.  3.  Valvular heart disease.  4.  Hypertension.  5.  Hypercholesterolemia.  6.  Osteoarthritis.   PAST SURGICAL HISTORY:  1.  Open heart surgery as described above.  2.  Status post orchiectomy.  3.  Carpal tunnel surgery.  4.  Neck surgery.  5.  Prior colonoscopy and sigmoidoscopy by me as outlined above.   CURRENT MEDICATIONS:  1.  Gemfibrozil 600 mg two tablets daily.  2.  Hydrocodone 7.5/500 two tablets daily.  3.  Xanax 0.5 mg four times a day.  4.  Coumadin 5 mg five days alternating with  7.5 mg 2 days weekly.  5.  Ranitidine 150 mg two tablets daily.  6.  Allopurinol two tablets b.i.d.  7.  Lotensin 2 mg daily.   ALLERGIES:  SULFA.   FAMILY HISTORY:  Mother died at age 83 of old age.  Father died at age 69 of  black lung.  Most significantly, he had a 64 year old brother diagnosed  with advanced colorectal cancer and died 2 months after diagnosis.   SOCIAL HISTORY:  The patient is married.  He is disabled.  No tobacco or  alcohol.   REVIEW OF SYSTEMS:  CARDIOPULMONARY:  No recent chest pain, dyspnea on  exertion, fevers or chills.  GASTROINTESTINAL:  No change in weight.  No  melena or hematemesis.   PHYSICAL EXAMINATION:  GENERAL:  A pleasant, 60 year old gentleman resting  comfortably.  VITAL SIGNS:  Weight 235.5, height 5 feet 10, temperature 98.2, BP 140/80,  pulse 88.  SKIN:  Warm and dry.  CHEST:  Lungs clear to auscultation.  CARDIAC:  Regular rate and rhythm with prominent murmur and prosthetic heart  sounds.  ABDOMEN:  Obese, positive bowel sounds, soft, nontender without appreciable  mass or organomegaly.  EXTREMITIES:  No edema.  RECTAL:  Deferred  to the time of colonoscopy.   IMPRESSION:  Mr. Lucas Bowman is a very pleasant, 60 year old gentleman  with low volume essentially paper hematochezia as he has had symptoms on and  off for years.  It is good to know that he had a colonoscopy and subsequent  sigmoidoscopy in 1999, and 2001, respectively.  It is bothersome that he has  a first-degree relative who succumbed to colorectal cancer at a relatively  young age.  He does need to have a colonoscopy at this time.  I have  discussed this approach with Mr. Ebert.  Potential risks, benefits and  alternatives have been reviewed.  The complicating factor is his need for  anticoagulation.  I have talked to Dr. Dionicio Stall, with [facility]  here in [place], who thought more or less Lovenox would be reasonable in  lieu of intravenous heparinization, however, he did go to the pains of  checking the Lovenox web site and called me back.   The information on the Lovenox web site states that there has been  inadequate studies done using Lovenox in a setting of bridging therapy for  invasive procedures to prevent thromboembolic events.  In fact, there have  been reports of thromboembolic events in patients who have undergone  bridging with Lovenox.   RECOMMENDATIONS:  Therefore, Mr. Lofaro will be hospitalized and will be  give formal intravenous heparinization.  As the Coumadin is held, this will  narrow the window off anticoagulation for the procedure itself.  He will  also receive subendocardial bacterial endocarditis prophylactic antibiotics.  Further recommendations to follow.   I have talked with Dr. Maryellen Pile about Mr. Aleshire this afternoon via  telephone.  I would like to thank Dr. Maryellen Pile for allowing me to see this nice  gentleman once again.  Further recommendations to follow.      Jonathon Bellows, M.D.  Electronically Signed    RMR/MEDQ  D:  08/18/2005  T:  08/19/2005  Job:  [id number]   cc:   Kathlee Nations  Fax:  [phone]

## 2010-11-13 NOTE — Group Therapy Note (Signed)
NAMEWILMON, Lucas Bowman             ACCOUNT NO.:  [id number]   MEDICAL RECORD NO.:  [id number]          PATIENT TYPE:  INP   LOCATION:  A207                          FACILITY:  [facility]   PHYSICIAN:  Margaretmary Dys, M.D.DATE OF BIRTH:  1951/04/29   DATE OF PROCEDURE:  02/21/2006  DATE OF DISCHARGE:                                   PROGRESS NOTE   SUBJECTIVE:  The patient is doing much  better.  Diarrhea has improved  slightly.  He denies any fever or chills.  He still has occasional blood in  his stools, especially when he wipes.  We still are awaiting his INR to  become therapeutic.   OBJECTIVE:  GENERAL:  Conscious, lying comfortable. No in acute distress.  VITAL SIGNS:  Blood pressure 124/76, pulse 70, respirations 18, temperature  97.6.  HEENT:  Normocephalic, atraumatic.  Oral mucosa was moist.  No exudates.  NECK:  Supple with no JVD or lymphadenopathy.  LUNGS:  Clear clinically.  HEART:  S1 and S2 regular. Prosthetic valve clicks.  ABDOMEN:  Soft, nontender.  Bowel sounds were positive.  EXTREMITIES:  No edema.   LABORATORY DATA/DIAGNOSTIC DATA:  White blood cell count 8.1, hemoglobin  13.4, hematocrit 38.5, platelet count 270 with no left shift.  PT 18.2, INR  1.5.   ASSESSMENT/PLAN:  Mr. Dolley is a 60 year old male with prostatic valve  replacement and intermittent hematochezia.  He has a strong family history  of colonic cancer in a first-degree relative.  He is doing fairly well after  a colonoscopy revealed two polyps which have been biopsied.  We are awaiting  his INR to become therapeutic, discharging him on Coumadin.  The patient is  having occasional blood in his stools.  Will just consult with GI.  The  patient will continue on Levsin  and Imodium p.r.n. for diarrhea secondary  to his irritable bowel syndrome.   He is otherwise stable at this time.      Margaretmary Dys, M.D.  Electronically Signed     AM/MEDQ  D:  02/21/2006  T:  02/21/2006  Job:   [id number]

## 2010-11-13 NOTE — Op Note (Signed)
NAMEMAKAIL, Lucas Bowman             ACCOUNT NO.:  [id number]   MEDICAL RECORD NO.:  [id number]          PATIENT TYPE:  INP   LOCATION:  A207                          FACILITY:  [facility]   PHYSICIAN:  R. Roetta Sessions, M.D. DATE OF BIRTH:  [birth year]   DATE OF PROCEDURE:  02/17/2006  DATE OF DISCHARGE:                                 OPERATIVE REPORT   PROCEDURE:  Diagnostic esophagogastroduodenoscopy and colonoscopy with cold  biopsy and snare polypectomy.   INDICATIONS FOR PROCEDURE:  The patient is a 60 year old gentleman with a  history of prosthetic heart valve, intermittent hematochezia, positive  family history of colon cancer in a first degree relative.  He also has long-  standing GERD symptoms.  He has been hospitalized for heparin bridging.  Heparin was stopped a good four hours ago.  He is ready for diagnostic  colonoscopy.  Prior to the procedure, he was reviewing his long-standing  reflux symptoms with me and was desirous of an EGD at the same time given  the pains we had to go through getting him ready for an invasive procedure.  I reviewed this approach with him.  He has occasional intermittent  esophageal dysphagia, he has GERD symptoms at least 2-3 times weekly in  spite of taking Zantac 150 mg b.i.d.  This approach has been discussed and I  feel it is not unreasonable at all to go ahead and do an EGD at the same  time.  Please see documentation in the medical record.   PROCEDURE NOTE:  O2 saturation, blood pressure, and pulse rate were  monitored throughout the entire of the procedure.  Conscious sedation was  achieved with Versed 6 mg IV and Demerol 125 mg IV and Phenergan 12.5 mg,  given slow IV push prior to the procedure in part for nausea and in part for  conscious sedation. Cetacaine spray for topical oropharyngeal anesthesia.   INSTRUMENT USED:  Olympus video chip system.   EGD FINDINGS:  Examination of the tubular esophagus revealed normal  appearing  esophageal mucosa.  The EG junction was easily traversed.  On  entering the stomach, the gastric cavity was empty and insufflated well with  air.  Thorough examination of the gastric mucosa including retroflex view of  the proximal stomach and esophagogastric junction demonstrated only a small  hiatal hernia.  The pylorus was patent and easily traversed.  Examination of  the first and second portion demonstrated no abnormalities.   THERAPY/DIAGNOSTIC MANEUVERS PERFORMED:  None.   The patient tolerated the procedure well and was prepared for colonoscopy.   COLONOSCOPY FINDINGS:  Digital rectal exam revealed no abnormalities.  The  prep was good.  Rectum:  Examination of the rectal mucosa including retroflex view of the  anal verge revealed no abnormalities.  Colon:  The colonic mucosa was surveyed from the rectosigmoid junction  through the left, transverse, and right colon to the area of the appendiceal  orifice, ileocecal valve, and cecum.  These structures were well seen and  photographed for the record.  From this level, the scope was slowly  withdrawn and all previously mentioned  mucosal surfaces were again seen.  The colon was meticulously inspected.  The patient had a 1 cm pedunculated  polyp at 25 cm that was hot snared and removed.  There was a 5 mm  pedunculated polyp at the splenic flexure which was also removed with hot  snare technique.  There was a 3 mm polyp in the mid descending colon which  was cold biopsied and removed.  The remainder of the colonic mucosa appeared  entirely normal.  The patient tolerated both procedures well.   IMPRESSION:  1. Normal esophagus, small hiatal hernia, otherwise normal stomach, D1 and      D2.  2. Normal rectum.  Left colon polyps as described above, removed as      described above.  Remainder of the colonic mucosa appeared normal.   RECOMMENDATIONS:  1. Will resume Coumadin this evening.  2. Resume heparin drip at 8 p.m. this  evening.  3. Follow up on pathology.  4. The patient probably needs to be on a PPI for gastroesophageal reflux      disease.  He needs a generic PPI as he describes for insurance      coverage, therefore, that leaves [place] with one option, i.e., Omeprazole.      We could consider starting him on Omeprazole in lieu of Zantac which I      think is really a good idea.  He is probably not on an effective acid      suppression regimen.  His INR will need to be watched closely and his      ultimate Coumadin dosing may be somewhat different than it had been      prior to institution of Omeprazole.  Close monitoring of INR      recommended, in particular, if Omeprazole is added to his regimen.  5. Would advance diet.      Jonathon Bellows, M.D.  Electronically Signed     RMR/MEDQ  D:  02/17/2006  T:  02/18/2006  Job:  [id number]   cc:   Kathlee Nations  Fax: [phone]

## 2010-11-13 NOTE — Group Therapy Note (Signed)
NAMEBRIGG, Lucas Bowman             ACCOUNT NO.:  [id number]   MEDICAL RECORD NO.:  [id number]          PATIENT TYPE:  INP   LOCATION:  A207                          FACILITY:  [facility]   PHYSICIAN:  Margaretmary Dys, M.D.DATE OF BIRTH:  18-Nov-1950   DATE OF PROCEDURE:  02/25/2006  DATE OF DISCHARGE:                                   PROGRESS NOTE   SUBJECTIVE:  The patient is comfortable, has no complaints, has no blood  through his bowels.  The patient is very frustrated that his INR is not  therapeutic as yet.   OBJECTIVE:  GENERAL:  Conscious, alert, comfortable, not in acute distress.  VITAL SIGNS:  Blood pressure was 121/82, pulse of 75, respirations was 20, T-  max was 97.1.  HEENT:  Normocephalic, atraumatic.  Oral mucosa was moist with no exudates.  NECK:  Supple, no JVD, no lymphadenopathy.  LUNGS:  Clear with good air entry bilaterally.  CARDIAC:  S1, S2 regular.  Prosthetic valve click is heard.  ABDOMEN:  Soft.  EXTREMITIES:  No edema.   LABORATORY/DIAGNOSTIC DATA:  White blood cell count was 8.1, hemoglobin of  13, hematocrit of 39, platelet count is 370, with no left shift.  PT was  21.8, INR 1.8.   ASSESSMENT AND PLAN:  Patient with gastrointestinal bleed, who was on  Coumadin and was held because of colonoscopy.  He is on Coumadin because of  metallic prosthetic valve.  We are awaiting the INR to become therapeutic.  Once this is therapeutic, he will be discharged home.  I will increase his  Coumadin dose to 10 mg once a day so we can, hopefully, make him therapeutic  soon.  Overall, the patient is stable with no more bleeding.  Hemoglobin and  hematocrit has remained stable.      Margaretmary Dys, M.D.  Electronically Signed     AM/MEDQ  D:  02/25/2006  T:  02/25/2006  Job:  [id number]

## 2010-11-13 NOTE — Group Therapy Note (Signed)
NAMEMARCIANO, MUNDT             ACCOUNT NO.:  [id number]   MEDICAL RECORD NO.:  [id number]          PATIENT TYPE:  INP   LOCATION:  A207                          FACILITY:  [facility]   PHYSICIAN:  Margaretmary Dys, M.D.DATE OF BIRTH:  11-28-1950   DATE OF PROCEDURE:  02/26/2006  DATE OF DISCHARGE:                                   PROGRESS NOTE   SUBJECTIVE:  The patient is doing OK, comfortable, has no complaints, still  awaiting his INR to become therapeutic.   OBJECTIVE:  His physical exam is unchanged.  Vital signs remain stable.   LABORATORY DATA:  His PT today is 21.9, INR 1.8.  The rest of his labs are  unremarkable.   ASSESSMENT/PLAN:  Awaiting therapeutic INR.  Patient's Coumadin dose  increased to 15 mg today per pharmacy.  I agree with this plan.  Continue  all his other medications.  He has had no evidence of active bleeding and  his H &H has remained stable.      Margaretmary Dys, M.D.  Electronically Signed     AM/MEDQ  D:  02/26/2006  T:  02/26/2006  Job:  [id number]

## 2010-11-13 NOTE — Group Therapy Note (Signed)
NAMETODD, JELINSKI             ACCOUNT NO.:  [id number]   MEDICAL RECORD NO.:  [id number]          PATIENT TYPE:  INP   LOCATION:  A207                          FACILITY:  [facility]   PHYSICIAN:  Margaretmary Dys, M.D.DATE OF BIRTH:  [birth year]   DATE OF PROCEDURE:  02/20/2006  DATE OF DISCHARGE:                                   PROGRESS NOTE   SUBJECTIVE:  The patient is having some diarrhea which he reports is part of  his irritable bowel syndrome.  He has not had any bloody stools or black  tarry stools.  He does have abdominal cramping with the diarrhea.  He has no  nausea.  When his INR becomes therapeutic, we are sending him home.   OBJECTIVE:  GENERAL:  Conscious, alert, comfortable and in no acute  distress.  VITAL SIGNS:  Blood pressure 131/84, pulse 64, respirations 20, temperature  97.7.  LUNGS:  Clear to auscultation bilaterally.  HEART:  S1, S2 regular.  Prosthetic valve clicks were heard.  ABDOMEN:  Soft.  Bowel sounds positive.  No masses palpable.  EXTREMITIES:  No edema.   LABORATORY DATA:  White blood count 7.1, hemoglobin 12.9, hematocrit 37.4,  platelet count 253,000 with no left shift.  PT was 18.0, INR 1.4.   ASSESSMENT AND PLAN:  Mr. Brunetto is a 60 year old male with prosthetic  valve replacement and one time hematochezia.  He has a strong family history  of colon cancer in a first degree relative.  He is doing fairly well after a  colonoscopy reveals two polyps which have been biopsied.  He has not had any  significant blood in his stools during his hospitalization.  However, we are  awaiting his INR to become therapeutic prior to being discharged.  Overall,  the patient is stable and doing fairly well at this time.  He will continue  Levsin which was prescribed by gastroenterology for the IBS.      Margaretmary Dys, M.D.  Electronically Signed     AM/MEDQ  D:  02/20/2006  T:  02/20/2006  Job:  [id number]

## 2010-11-13 NOTE — H&P (Signed)
NAMEDONTRE, LADUCA             ACCOUNT NO.:  [id number]   MEDICAL RECORD NO.:  [id number]           PATIENT TYPE:   LOCATION:                                 FACILITY:   PHYSICIAN:  R. Roetta Sessions, M.D.      DATE OF BIRTH:   DATE OF ADMISSION:  24-Oct-1950  DATE OF DISCHARGE:  LH                                HISTORY & PHYSICAL   CHIEF COMPLAINT:  Intermittent low-volume hematochezia,  needs colonoscopy.  Positive family history of colon cancer.   PRIMARY CARE PHYSICIAN:  Kathlee Nations, MD, I believe in [place].   HISTORY OF PRESENT ILLNESS:  Mr. Vallone is a 60 year old gentleman who  presents for colonoscopy.  We have tried to schedule this for him at least a  couple of times in the past in the last year.  He has had conflicts as it is  quite difficulty in his situation with a history of valvular heart disease  with artificial heart valve requiring a heparin bridge for procedures.  He  says he is ready now to proceed with colonoscopy.  He has a history of small-  volume hematochezia off and on for a couple of years.  He also has a history  of IBS with a predominance of diarrhea, which he takes Imodium on a regular  basis for.  He denies any constipation.  He has intermittent abdominal  cramping.  He denies any nausea or vomiting or weight loss.   Last colonoscopy was in 1999 when he had diverticulosis.  Flexible  sigmoidoscopy in 2001 for recurrent rectal bleeding.  Finally underwent  internal sphincterotomy for an anal fissure resection by Dr. Harold Hedge in  [place].  He has a brother who was diagnosed with advanced colorectal cancer at  age 44 and succumbed to the illness 2 months later.  He has another brother  who has had multiple precancerous polyps removed.   CURRENT MEDICATIONS:  1. Lotensin 10 mg daily.  2. Allopurinol 100 mg tablets two twice a day.  3. Darvocet-N 100 four times a day as needed.  4. Ranitidine 150 mg two tablets daily.  5. Coumadin 5 mg five  days a week, 7.5 mg two days a week.  6. Hydrocodone 7.5/500 mg two times a day p.r.n.  7. Gemfibrozil 600 mg two tablets daily.  8. Valium 5 mg p.r.n.  9. Imodium p.r.n.  10.Gas-X p.r.n.   ALLERGIES:  SULFA.   PAST MEDICAL HISTORY:  1. Diarrhea-predominant IBS.  2. Occasional gastroesophageal reflux disease.  3. Valvular heart disease, status post open heart surgery in 2001.  He has      a St. Jude valve in the aortic position and mitral valve repair.  He      also had grafting of abdominal aortic aneurysm.  4. Hypertension.  5. Hyperlipidemia.  6. Osteoarthritis.  7. Status post orchiectomy.  8. Carpal tunnel surgery.  9. Neck surgery twice.  10.Prior colonoscopy and sigmoidoscopy as outlined above.  He has also had      prior EGD and was found to have small single antral erosion and  multiple areas of focal erythema in the second portion of the duodenum.      He is felt to have suffered an upper GI bleed secondary to Vioxx at      that time in September 2000.   FAMILY HISTORY:  Mother died at age 88 of old age.  Father died at age 64 of  black lung.  He also had heart disease.  Colon cancer in a brother age 32  who died 2 months later due to advanced colorectal cancer.   SOCIAL HISTORY:  He is married.  He is disabled.  No tobacco or alcohol use.   REVIEW OF SYSTEMS:  GASTROINTESTINAL:  See HPI.  CARDIOPULMONARY:  No recent  chest pain, dyspnea on exertion.   PHYSICAL EXAMINATION:  VITAL SIGNS:  Weight 228, height 5 feet 11 inches,  temperature 97.8, blood pressure 142/88, pulse 68.  GENERAL:  A pleasant, well-nourished, well-developed Caucasian male in no  acute distress.  SKIN:  Warm and dry, no jaundice.  HEENT:  Sclerae nonicteric.  Oropharyngeal mucosa moist and pink.  No  lesions, erythema or exudate.  CHEST:  Lungs are clear to auscultation.  CARDIAC:  Regular rate and rhythm and prominent murmur, prosthetic heart  sounds.  ABDOMEN:  Positive bowel sounds,  obese but symmetrical, soft.  Nontender, no  organomegaly or masses.  EXTREMITIES:  No edema.   IMPRESSION:  Mr. Hamad is a 60 year old gentleman with intermittent low-  volume hematochezia, irritable bowel syndrome with diarrhea, a family  history of colorectal cancer in a first degree relative at a fairly young  age.  He is overdue for high-risk surveillance colonoscopy.  Given his  prosthetic heart valve and cardiac valvular problems, he will need to  undergo heparin bridge and subacute bacterial endocarditis prophylaxis.  We  will arrange for this to be done with the help of the hospitalist service.  The above findings and plan as outlined were discussed with Dr. Jena Gauss, who  was in agreement.   PLAN:  1. Colonoscopy in the near future.  2. Admission to the hospital or heparin bridge.  3. He will receive SBE prophylaxis.      Tana Coast, P.AJonathon Bellows, M.D.  Electronically Signed    LL/MEDQ  D:  02/01/2006  T:  02/01/2006  Job:  [id number]   cc:   Kathlee Nations  Fax: [phone]

## 2010-11-13 NOTE — H&P (Signed)
Lucas Bowman, Lucas Bowman             ACCOUNT NO.:  [id number]   MEDICAL RECORD NO.:  [id number]          PATIENT TYPE:  INP   LOCATION:  A207                          FACILITY:  [facility]   PHYSICIAN:  Margaretmary Dys, M.D.DATE OF BIRTH:  09-30-1950   DATE OF ADMISSION:  02/14/2006  DATE OF DISCHARGE:  LH                                HISTORY & PHYSICAL   PRIMARY CARE PHYSICIAN:  Dr. Kathlee Nations in [place]   GASTROENTEROLOGIST:  Dr. Roetta Sessions   ADMITTING DIAGNOSES:  1. Hematochezia.  2. Strong family history of colon cancer.  3. Chronic back pain.  4. History of valvular heart disease status post aortic valve replacement      on Coumadin.   CHIEF COMPLAINT:  GI bleed of several months' duration.   HISTORY OF PRESENT ILLNESS:  Mr. Lucas Bowman is a 60 year old Caucasian male  who has been directly admitted from home.   He has been planned for colonoscopy.  However, the gastroenterologist is  requesting the hospitalists service to help bridge him from his Coumadin to  heparin so that he can have his colonoscopy with only few hours off  anticoagulation.  He does have a St. Jude's valve.  Patient will also need  SBE prophylaxis prior to the colonoscopy.  Patient does give a history of  intermittent bright red blood per rectum.  In the past he was told that he  has had hemorrhoids which he has actually had hemorrhoidal surgery.  However, one of his brothers died at age 61 from colon cancer and also he  has another brother who had multiple polyps removed that were said to be  malignant.  Hence, the patient is at significant risk for colon cancer.  Patient also takes Coumadin for his atrial fibrillation.   His last colonoscopy was in 1999.  At the time he was said to have  diverticulosis.  Again, he was bleeding.  A flexible sigmoidoscopy in 2001  for recurrent rectal bleed did show evidence of hemorrhoids.  He also had  internal sphincterotomy for an anal fissure.   Patient  otherwise denies any chest pain.  No shortness of breath.  He does  have chronic back pain for which he is on disability for.  He otherwise  remains comfortable at this time.   PAST MEDICAL HISTORY:  1. History of recurrent GI bleed with status post surgery.  2. Aortic valve replacement status post open heart surgery in 2001.  He      has a St. Jude's valve in the aortic position and mitral valve repair.      He also had grafting of an abdominal aortic aneurysm.  3. Diarrhea-predominant IBS.  4. Occasional gastroesophageal reflux disease.  5. Hypertension.  6. Hyperlipidemia.  7. Osteoarthritis.  8. Status post orchiectomy.  9. Carpal tunnel surgery.  10.Neck surgery twice.  11.Prior colonoscopy and sigmoidoscopy as outlined above.   MEDICATIONS:  1. Lotensin 10 mg once a day.  2. Allopurinol 100 mg two tablets twice a day.  3. Darvocet-N 100 one p.o. q.6h. p.r.n.  4. Ranitidine 300 mg p.o. daily.  5. Coumadin  5 mg five days a week and 7.5 mg two days a week.  6. Hydrocodone 7.5/500 three times a day p.r.n.  7. Gemfibrozil 600 mg p.o. b.i.d.  8. Valium 5 mg p.o. p.r.n.  9. Imodium p.r.n.  10.Gas-X p.r.n.  11.Please note patient stopped taking his Coumadin yesterday, February 13, 2006.   ALLERGIES:  Reports allergies to SULFA.   FAMILY HISTORY:  Mother died at age 29 of old age.  Father died at age 68 of  a black lung.  He also had heart disease.  Colon cancer in a brother age 57  who died two months later due to advanced colorectal cancer.  He also had a  brother who had precancerous malignant polyps removed from his colon.   SOCIAL HISTORY:  He is on disability.  He denies any tobacco, alcohol, or  illicit drug use.  He is married.   PHYSICAL EXAMINATION:  GENERAL:  Conscious, alert, comfortable, not in acute  distress.  VITAL SIGNS:  Blood pressure was 150/108, pulse of 79, respiration is 20,  Tmax 97.5, oxygen saturation was 96% on room air.  HEENT:   Normocephalic, atraumatic.  Oral mucosa was moist.  No exudates were  noted.  NECK:  Supple.  No JVD.  No lymphadenopathy.  LUNGS:  Clear clinically with good air entry bilaterally.  HEART:  S1, S2, regular.  No S3, S4, gallops, or rubs.  He does have a loud  aortic click consistent with his aortic valve which was replaced.  ABDOMEN:  Soft, nontender.  Bowel sounds were positive.  No masses palpable.  EXTREMITIES:  No edema.  No induration or tenderness was noted.   LABORATORY/DIAGNOSTIC DATA:  White blood cell count was 10.4, hemoglobin  13.9, hematocrit 40.2, platelet count was 310 with no left shift.  PT 28.8,  INR of 2.5.  Sodium 140, potassium 3.6, chloride of 103, CO2 of 30, glucose  of 94, BUN of 16, creatinine was 0.9, calcium of 10.   ASSESSMENT AND PLAN:  Mr. Lucas Bowman is a 60 year old Caucasian male  who has been directly admitted and planned for colonoscopy.  We will plan to  start him on heparin infusion once his INR drops below 2.2.   I will notify GI of the admission.  Patient will need to be started on  heparin to bridge him through the surgery.  I will resume all his other  medications.  He remains hemodynamically stable at this time with no  evidence of active bleeding.   Patient will need SBE prophylaxis prior to the colonoscopy.  I have  discussed the above plan with the patient and he verbalized full  understanding.  I think it would be safe to actually try to start his  heparin infusion when his INR is between 2.4 and 2 as any period of not  being anticoagulated would put him at significant risk for clots on his  prosthetic valve.  Will control pain as needed.      Margaretmary Dys, M.D.  Electronically Signed     AM/MEDQ  D:  02/15/2006  T:  02/15/2006  Job:  [id number]

## 2010-11-13 NOTE — Group Therapy Note (Signed)
Lucas Bowman, Lucas Bowman             ACCOUNT NO.:  [id number]   MEDICAL RECORD NO.:  [id number]          PATIENT TYPE:  INP   LOCATION:  A207                          FACILITY:  [facility]   PHYSICIAN:  Margaretmary Dys, M.D.DATE OF BIRTH:  01-04-1951   DATE OF PROCEDURE:  02/18/2006  DATE OF DISCHARGE:                                   PROGRESS NOTE   SUBJECTIVE:  The patient has no concerns.  He has not had any bloody or  tarry stools.  He denies any abdominal pain.  We are awaiting his INR to  become therapeutic.   OBJECTIVE:  GENERAL:  Conscious, alert, comfortable, not in acute distress.  VITAL SIGNS:  Stable.  HEENT:  Normocephalic, atraumatic.  LUNGS:  Clear.  HEART:  S1 and S2 with prosthetic valve click heard.  ABDOMEN:  Soft.  EXTREMITIES:  No edema.   LABORATORY/DIAGNOSTIC DATA:  INR is 1.3, the rest of his labs are  essentially unchanged from yesterday.   ASSESSMENT/PLAN:  Lucas Bowman is a 60 year old Caucasian male with  prosthetic valve, intermittent hematochezia, and a positive family history  of colon cancer in a first degree relative.  He has long-standing GERD  symptoms.  The patient had an EGD and colonoscopy yesterday and the only  positive finding were polyps in the colon which were removed.  Biopsies are  pending.  He did very well.  He has not had any more bleeding episodes  during the course of this hospitalization.  We are awaiting INR to become  therapeutic at least 2.2 and above range prior to him being discharged.  We  will discontinue his heparin once that is achieved.  We will continue all  his other home medications.      Margaretmary Dys, M.D.  Electronically Signed     AM/MEDQ  D:  02/18/2006  T:  02/18/2006  Job:  [id number]

## 2010-11-13 NOTE — Discharge Summary (Signed)
NAMELEELAND, LOVELADY             ACCOUNT NO.:  [id number]   MEDICAL RECORD NO.:  [id number]          PATIENT TYPE:  INP   LOCATION:  A207                          FACILITY:  [facility]   PHYSICIAN:  Margaretmary Dys, M.D.DATE OF BIRTH:  [birth year]   DATE OF ADMISSION:  02/14/2006  DATE OF DISCHARGE:  09/02/2007LH                                 DISCHARGE SUMMARY   DISCHARGE DIAGNOSES:  1. GI bleed.  2. History of tubulovillous adenoma status post colonoscopy.  3. Patient admitted due to elevated INR and plan to transition him to      Heparin in preparation for his colonoscopy.   DISCHARGE MEDICATIONS:  1. The patient is to resume all his current medications including his      Coumadin schedule.  2. Bentyl 10 mg p.o. t.i.d.   CONSULTATIONS:  1. Roetta Sessions, M.D.   PROCEDURES:  1. Colonoscopy on February 17, 2006 and a  Diagnostic EGD.  Results show a      normal esophagus, a normal rectum, left colon polyps removed and      pathology pending.   FOLLOW UP:  The patient is to follow up with Dr. Jena Gauss in the next 3-4  weeks.   DIET:  Regular.   ACTIVITY:  As tolerated.   PATHOLOGY RESULTS:  Results of pathology of polyp was suggestive of a  hyperplastic polyp, no evidence of malignancy was noted, no edematous  changes were noted.   HOSPITAL COURSE:  Mr. Niu is a 60 year old Caucasian male who was  admitted to the hospital for a colonoscopy due to his strong family history  of colon cancer and also a prior history of tubulovillous adenoma removed  his bowel in 1999.  The patient was admitted he is on Coumadin for his 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  This was done successfully and the patient had his  procedure done on February 17, 2006.   The patient had a protracted hospital stay after he had his EGD and  colonoscopy because we had to start him back on Coumadin.  His INR took  awhile to get to the  desired therapeutic range.  On the day of discharge it  was 2.89 and he was subsequently discharged home in satisfactory condition.  Otherwise throughout his hospital stay the patient did not have any  complications.  He had no more bleeding per rectum and his H&H was mostly  stable.   He had a repeat sigmoidoscopy on February 22, 2006 which did not reveal any  abnormalities.   SPECIAL INSTRUCTIONS:  The patient advised to call Dr. Jena Gauss if bleeding  occurs again and he is informed that he will need a repeat of his  colonoscopy in 3 years. INR blood work in 3 days.      Margaretmary Dys, M.D.  Electronically Signed     AM/MEDQ  D:  02/27/2006  T:  02/27/2006  Job:  [id number]

## 2010-11-13 NOTE — Op Note (Signed)
Lucas Bowman, Lucas Bowman             ACCOUNT NO.:  [id number]   MEDICAL RECORD NO.:  [id number]          PATIENT TYPE:  INP   LOCATION:  A207                          FACILITY:  [facility]   PHYSICIAN:  R. Roetta Sessions, M.D. DATE OF BIRTH:  06-24-1951   DATE OF PROCEDURE:  02/22/2006  DATE OF DISCHARGE:                                 OPERATIVE REPORT   PROCEDURE:  Sigmoidoscopy with resolution clipping.   ENDOSCOPIST:  Gerrit Friends. Rourk, M.D.   INDICATIONS FOR PROCEDURE:  The patient is a 60 year old gentleman with  intermittent low-volume hematochezia, and positive family history of colon  cancer for which a colonoscopy was done.  On 02/17/2006 he was found to have  a 1 cm pedunculated polyp at 25 cm and other diminutive polyps more proximal  in the colon.  This large polyp was removed with Pott's snare cautery.  It  turned out to be a tubular villous adenoma.  He was started back on Coumadin  the same day and heparin was started back.  He has continued to have low  volume hematochezia, although his hemoglobin has failed to drop, even a  relative drop out of the normal range.  It is 13.4 today.  His INR is slowly  going up.  It is 1.4 today.  He remains on Coumadin and heparin.  __________  whether or not he has had a postpolypectomy bleed.  Sigmoidoscopy is now  being done.  The potential risks, benefits, and alternatives have been  reviewed, questions answered.  His heparin was stopped at 11 a.m. today.  Please see documentation in the medical record.   He received ampicillin 2 grams, IV gentamicin 120 mg IV prior to the  procedure.   FINDINGS:  A digital rectal exam revealed no abnormalities.   ENDOSCOPIC FINDINGS:  The rectal vault was empty although it was friable and  easily oozed a little blood when it was scoped, even making just gentle  contact with it.  A retroflex view of the distal rectum revealed no  abnormalities.   COLON:  The colonic mucosa was surveyed from the  rectosigmoid junction to 45-  cm.  The scope was moved along in a nice 1:1 fashion.  There was no blood in  the lumen of the colon, although at the site of prior polypectomy at 25 cm  there was inherent clot on the polypectomy site, suspicious for recent  bleeding.  There was more blood in the rectal mucosa from gentle scope  manipulation than a small amount of clot at the polypectomy site.  I  ultimately attached 2 resolution clips to facilitate healing of this lesion.  Two came off when I tried to deploy them.  I used a total of 4 which two net  being successfully deployed.  There was excellent hemostasis maintained.  The patient tolerated the brief procedure well.  He did receive IV Versed  and Demerol in incremental doses for conscious sedation.   RECOMMENDATIONS:  1. Advance diet as tolerated.  Continue heparinization resuming it at 8      p.m. this evening.  I suspect some  of his bleeding all along is related      to benign anorectal bleeding in the setting of anticoagulation,      therefore, we will      give him a course of Anusol HC suppositories 1 per rectum at bedtime.  2. He is to continue Coumadin as his INR becomes therapeutic.  3. He will need a repeat surveillance colonoscopy in 3 years.      Jonathon Bellows, M.D.  Electronically Signed     RMR/MEDQ  D:  02/22/2006  T:  02/23/2006  Job:  [id number]   cc:   [facility]   Kathlee Nations, M.D.  [place]  [place]

## 2011-06-28 IMAGING — CR DG CHEST 1V PORT
1 series · 1 of 1 positions shown · non-contrast
Comparison: 02/20/2009

CLINICAL DATA: Septic shock.  Follow-up

PORTABLE CHEST - 1 VIEW

[AP]
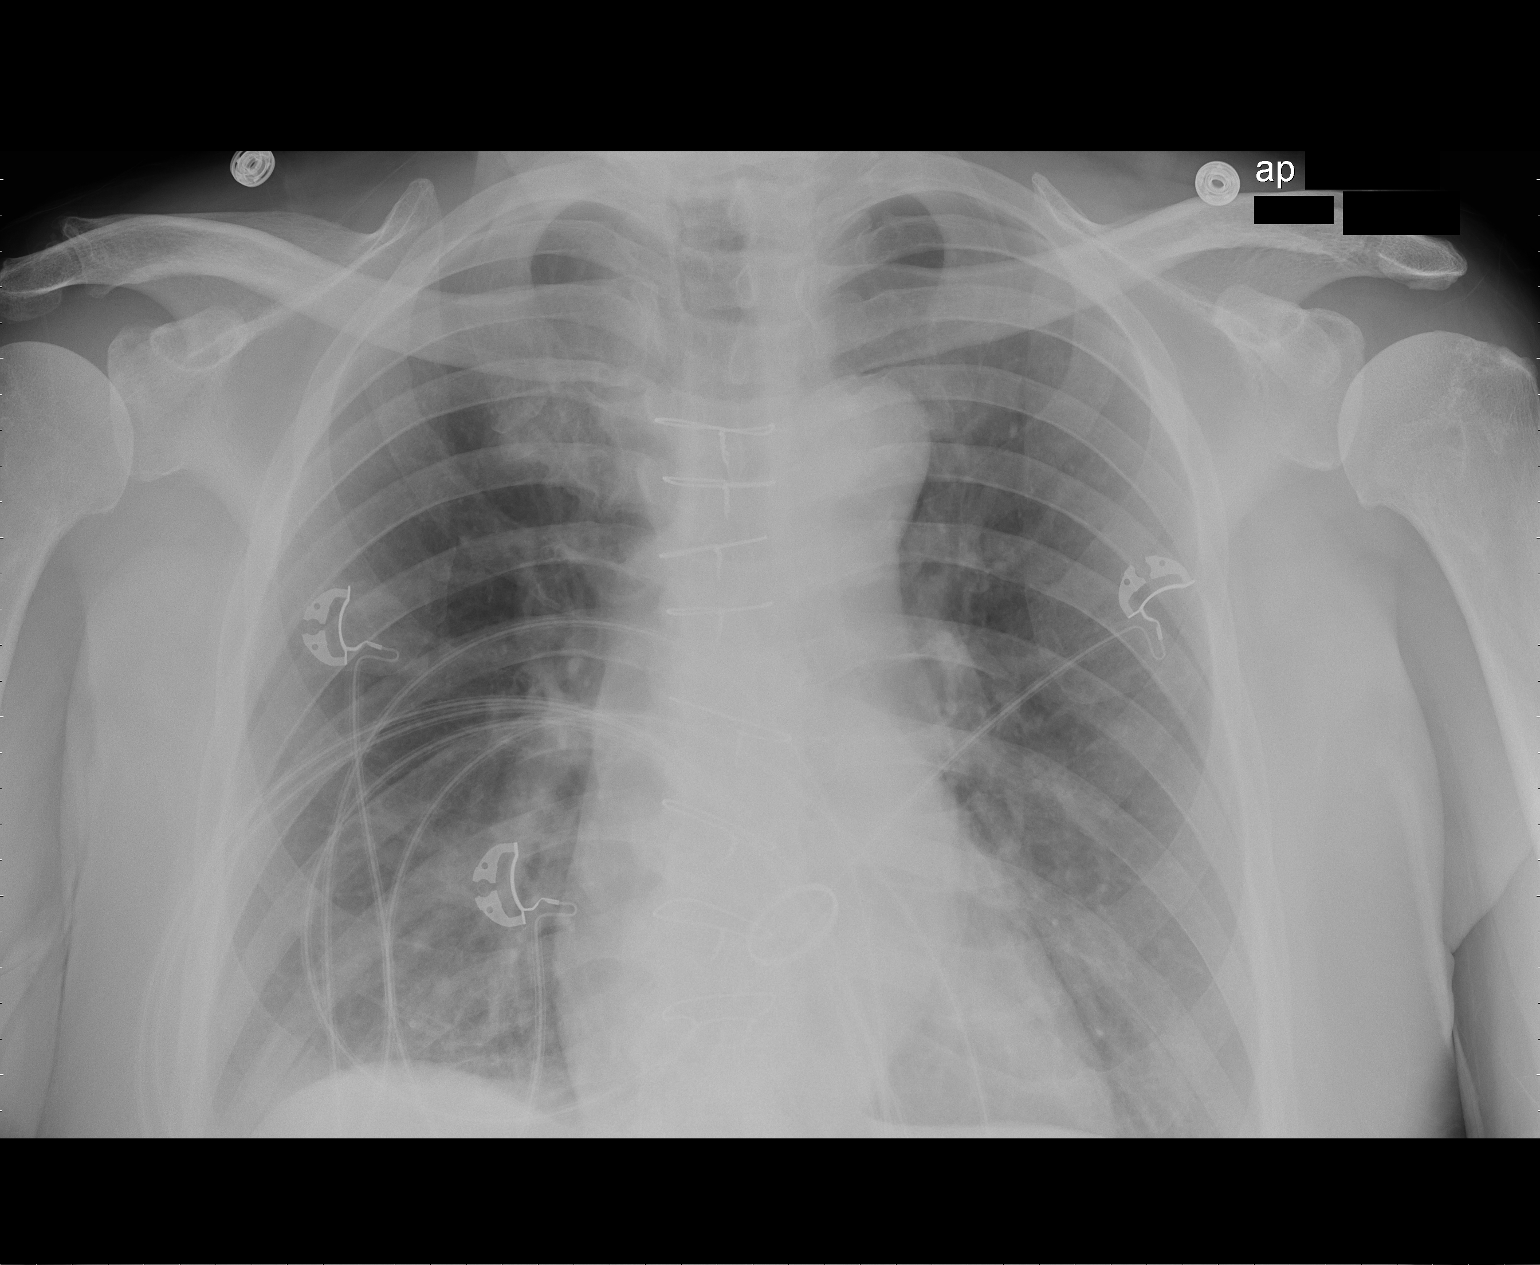

[1 of 1 positions shown; findings below may reference images not displayed]

FINDINGS: There are changes of median sternotomy for valve
replacement.  The heart size is normal.  Pulmonary vascular
congestion has improved/nearly resolved compared to 02/20/2009.
There has been improvement in aeration of both lungs compared to
02/20/2009.  Mild residual interstitial prominence of the lung
bases is noted.  Small right pleural effusion is suspected.
IMPRESSION: Improving aeration of both lungs compatible with decreasing
pulmonary edema.

## 2011-06-29 IMAGING — CR DG CHEST 1V PORT
1 series · 1 of 1 positions shown · non-contrast
Comparison: 02/23/2009

CLINICAL DATA: Septic shock/PICC placement

PORTABLE CHEST - 1 VIEW

[AP]
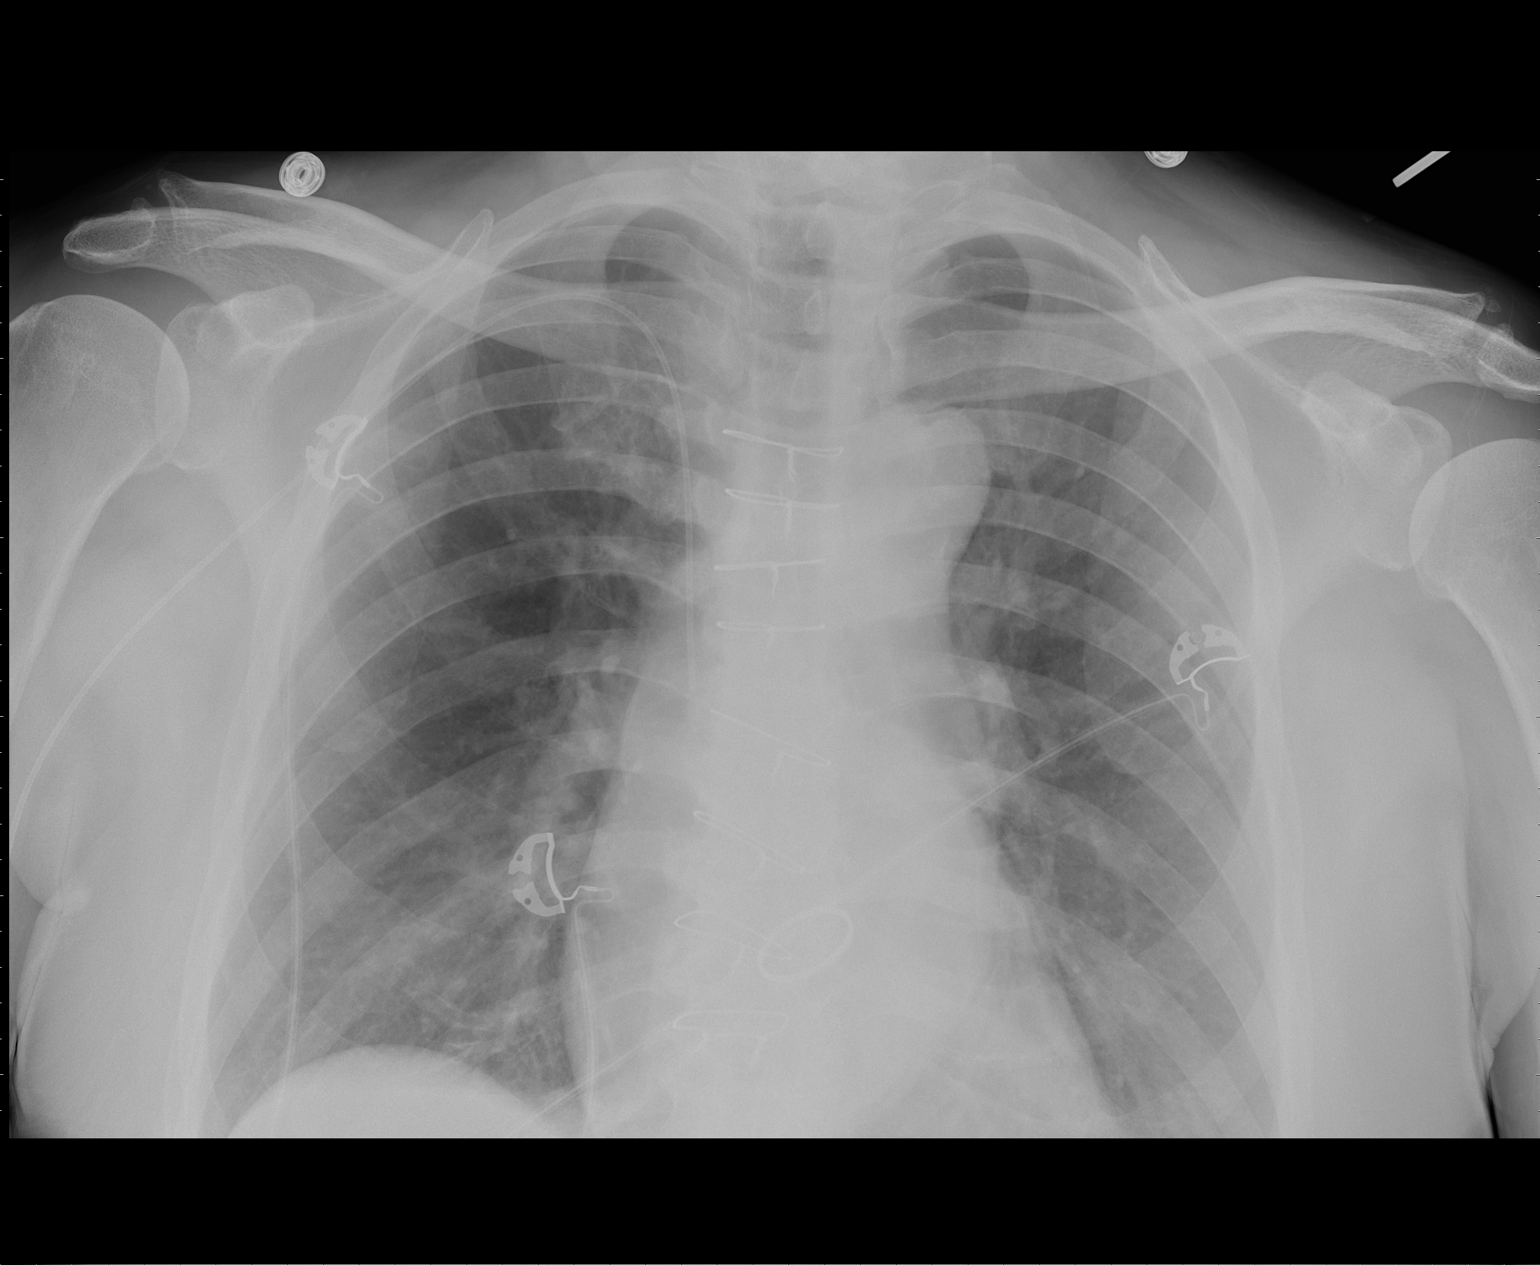

[1 of 1 positions shown; findings below may reference images not displayed]

FINDINGS: A right arm PICC has been placed with its tip in the mid
to low SVC, several centimeters above the cavoatrial junction.

Heart size currently normal without congestive heart failure.
Minimal residual right lower lobe densities stable.  Prosthetic
aortic valve unchanged.
IMPRESSION: Right arm PICC placement to the mid/low SVC.

## 2013-03-22 ENCOUNTER — Ambulatory Visit: Payer: Self-pay | Admitting: Gastroenterology

## 2013-04-16 ENCOUNTER — Telehealth: Payer: Self-pay | Admitting: Internal Medicine

## 2013-04-16 NOTE — Telephone Encounter (Signed)
Pt is passing dark red blood when he has a BM. He said usually its bright red, but now its dark with clots. I made him OV for 11/5 at 11 with LSL. Patient asked if he should go to the ER and I told him if he is hurting that bad and starts passing a lot of blood that he needed to go to ER. Can we bring him in an Urgent spot? Please advise [phone] or [phone]

## 2013-04-17 NOTE — Telephone Encounter (Signed)
Lucas Bowman, we have not seen pt since 2010. See if you can get him sooner appt. He definitely needs to go to the ED if he gets worse.

## 2013-04-17 NOTE — Telephone Encounter (Signed)
I offered OV for tomorrow at 8 am but patient could not get here due to transportation issues. I told him if something else becomes available that I would call him back. Pt agreed.

## 2013-04-18 ENCOUNTER — Ambulatory Visit: Payer: Self-pay | Admitting: Gastroenterology

## 2013-04-26 ENCOUNTER — Telehealth: Payer: Self-pay | Admitting: Internal Medicine

## 2013-04-26 NOTE — Telephone Encounter (Signed)
Pt has OV for 11/5 at 11 with LSL and I was told to try to bring him in URG spot or anything available this week. I offered him today with AS at 230, but patient said he had someone coming to check his furnace and was thinking about cancelling the one for Wednesday next week. He said he was feeling better, but also said he had been to the ED with rectal bleeding and that he was on coumadin. He had multiple questions about his insurance as well. Pt is undecided what he wants to do and I transferred call to CM.

## 2013-04-26 NOTE — Telephone Encounter (Signed)
I spoke with Lucas Bowman and he stated he would like to keep the appt for nest Wednesday.

## 2013-05-02 ENCOUNTER — Ambulatory Visit: Payer: Self-pay | Admitting: Gastroenterology

## 2013-05-03 ENCOUNTER — Encounter: Payer: Self-pay | Admitting: Internal Medicine

## 2013-05-17 ENCOUNTER — Ambulatory Visit: Payer: 59 | Admitting: Gastroenterology

## 2014-02-04 ENCOUNTER — Encounter: Payer: Self-pay | Admitting: Internal Medicine

## 2014-03-07 ENCOUNTER — Telehealth: Payer: Self-pay | Admitting: Internal Medicine

## 2014-03-07 NOTE — Telephone Encounter (Signed)
No open slots as of now

## 2014-03-07 NOTE — Telephone Encounter (Signed)
Pt's daughter is coming in this Sunday from [place] to stay with the patient next week, Pt is to see RMR tomorrow at 0930. Daughter is asking to have patient scheduled for colonoscopy one day next week with RMR while she is here if that's possible. She is leaving to go back next Sunday.

## 2014-03-07 NOTE — Telephone Encounter (Signed)
I spoke with patient's family member and she is coming in from [place] this weekend to stay all of next week with the patient. She was asking could we schedule his procedure one day next week while she's here. Patient is to see RMR in the morning at 0930. If he is able to keep his OV for tomorrow please cancel the other OV with RMR on 9/22

## 2014-03-07 NOTE — Telephone Encounter (Signed)
PATIENT WIFE CALLED WITH QUESTION ABOUT APPOINTMENT  PLEASE CALL AT [phone]

## 2014-03-08 ENCOUNTER — Other Ambulatory Visit: Payer: Self-pay | Admitting: Internal Medicine

## 2014-03-08 ENCOUNTER — Encounter: Payer: Self-pay | Admitting: Internal Medicine

## 2014-03-08 ENCOUNTER — Ambulatory Visit (INDEPENDENT_AMBULATORY_CARE_PROVIDER_SITE_OTHER): Payer: 59 | Admitting: Internal Medicine

## 2014-03-08 ENCOUNTER — Encounter (INDEPENDENT_AMBULATORY_CARE_PROVIDER_SITE_OTHER): Payer: Self-pay

## 2014-03-08 VITALS — BP 138/81 | HR 61 | Temp 98.4°F | Ht 70.0 in | Wt 189.6 lb

## 2014-03-08 DIAGNOSIS — R1319 Other dysphagia: Secondary | ICD-10-CM

## 2014-03-08 DIAGNOSIS — K2100 Gastro-esophageal reflux disease with esophagitis, without bleeding: Secondary | ICD-10-CM

## 2014-03-08 DIAGNOSIS — Z86010 Personal history of colonic polyps: Secondary | ICD-10-CM

## 2014-03-08 NOTE — Progress Notes (Signed)
Primary Care Physician:  Michel Bickers, MD Primary Gastroenterologist:  Dr. Gala Romney  Pre-Procedure History & Physical: HPI:  Lucas Bowman is a 63 y.o. male here for followup for consideration of a surveillance colonoscopy; history of high-grade colonic adenomas and a positive family history of colon cancer in a first degree relative at at a young age. Patient has done well from a GI standpoint;  he may have scant rectal bleeding on occasion. GERD fairly well controlled on Protonix. However, has had intermittent esophageal dysphagia and is concerned about those symptoms. I last saw him in 2007 when he underwent an EGD and a colonoscopy  -  small adenoma removed from his left colon and mild erosive reflux esophagitis along with small hiatal hernia.  It is notable patient had had all kinds of problems after seeing me for EGD and colonoscopy. Subsequent this, he underwent a cholecystectomy postoperatively he developed septic shock and was admitted to the hospital in [place].  He suffered a fall and a subdural hematoma about 3 years ago was hospitalized at [facility]. He developed rapid atrial fib requiring cardioversion around that time as well. He continues to be anticoagulated. He has had progressive difficulties with conscious sedation being more and more difficult to adequately sedate (Demerol 150 mg and Versed 10 mg during endoscopy 2000 (states sedation was inadequate). Had a very difficult time with conscious sedation for a TEE 3 years ago.    Past Medical History  Diagnosis Date  . Septic shock 03/06/2009  . Anemia   . Neck pain   . GERD (gastroesophageal reflux disease)   . Hiatal hernia   . H/O aortic valve replacement   . Gout   . Depression   . Mitral insufficiency   . Hemorrhoids   . Gastritis   . Tubular adenoma   . Internal hemorrhoids   . Irritable bowel syndrome     Past Surgical History  Procedure Laterality Date  . Coronary angioplasty    .  Esophagogastroduodenoscopy  01/28/2009    GQQ:PYPP distal esophageal erosions consistent with mild erosive reflux esophagitis/ Small hiatal hernia otherwise.  Upper GI tract appeared normal.  . Colonoscopy  01/28/2009    RMR: Internal hemorrhoids, otherwise normal rectum/Sigmoid polyp, status post hot snare polypectomy.ADENOMATOUS POLYP   . Colonoscopy  02/17/2006    Dr.Aston Lawhorn-Normal rectum.  Left colon polyps= tubular adenoma,Remainder of the colonic mucosa appeared normal.   . Esophagogastroduodenoscopy  02/17/2006    Dr.Brena Windsor-Normal esophagus, small hiatal hernia, otherwise normal stomach, D1 and D2  . Gallbladder surgery      Prior to Admission medications   Medication Sig Start Date End Date Taking? Authorizing Provider  allopurinol (ZYLOPRIM) 300 MG tablet Take 300 mg by mouth daily.  01/28/14  Yes Historical Provider, MD  carvedilol (COREG) 6.25 MG tablet 6.25 mg 2 (two) times daily with a meal.  02/12/14  Yes Historical Provider, MD  cyclobenzaprine (FLEXERIL) 10 MG tablet Take 10 mg by mouth at bedtime.   Yes Historical Provider, MD  desloratadine (CLARINEX) 5 MG tablet Take 5 mg by mouth daily.   Yes Historical Provider, MD  diazepam (VALIUM) 5 MG tablet Take 5 mg by mouth.  01/29/14  Yes Historical Provider, MD  [place] 125 MCG tablet Take 0.125 mg by mouth daily.  02/11/14  Yes Historical Provider, MD  furosemide (LASIX) 20 MG tablet Take 20 mg by mouth daily.  02/12/14  Yes Historical Provider, MD  gemfibrozil (LOPID) 600 MG tablet Take 600 mg by  mouth 2 (two) times daily before a meal.  02/04/14  Yes Historical Provider, MD  HYDROcodone-acetaminophen (NORCO) 10-325 MG per tablet Take 1 tablet by mouth every 6 (six) hours as needed.  02/18/14  Yes Historical Provider, MD  pantoprazole (PROTONIX) 40 MG tablet Take 40 mg by mouth daily.  01/28/14  Yes Historical Provider, MD  Vitamin D, Ergocalciferol, (DRISDOL) 50000 UNITS CAPS capsule Take 50,000 Units by mouth every 7 (seven) days.   Yes  Historical Provider, MD  warfarin (COUMADIN) 5 MG tablet Take 5 mg by mouth daily at 6 PM.  12/17/13  Yes Historical Provider, MD  ondansetron (ZOFRAN-ODT) 4 MG disintegrating tablet  02/05/14   Historical Provider, MD  temazepam (RESTORIL) 30 MG capsule  02/26/14   Historical Provider, MD    Allergies as of 03/08/2014 - Review Complete 03/08/2014  Allergen Reaction Noted  . Sulfonamide derivatives      No family history on file.  History   Social History  . Marital Status: Married    Spouse Name: N/A    Number of Children: N/A  . Years of Education: N/A   Occupational History  . Not on file.   Social History Main Topics  . Smoking status: Never Smoker   . Smokeless tobacco: Not on file  . Alcohol Use: No  . Drug Use: No  . Sexual Activity: Not on file   Other Topics Concern  . Not on file   Social History Narrative  . No narrative on file    Review of Systems: See HPI, otherwise negative ROS  Physical Exam: BP 138/81  Pulse 61  Temp(Src) 98.4 F (36.9 C) (Oral)  Ht 5\' 10"  (1.778 m)  Wt 189 lb 9.6 oz (86.002 kg)  BMI 27.20 kg/m2 General:  Pleasant, alert, somewhat chronically ill-appearing. No acute distress. Skin:  Intact without significant lesions or rashes. Eyes:  Sclera clear, no icterus.   Conjunctiva pink. Ears:  Normal auditory acuity. Nose:  No deformity, discharge,  or lesions. Mouth:  No deformity or lesions. Neck:  Supple; no masses or thyromegaly. No significant cervical adenopathy. Lungs:  Clear throughout to auscultation.   No wheezes, crackles, or rhonchi. No acute distress. Heart:  Regular rate; prominent prosthetic valve heart sounds with murmur.  Abdomen: Non-distended, normal bowel sounds.  Soft and nontender without appreciable mass or hepatosplenomegaly.  Pulses:  Normal pulses noted. Extremities:  Without clubbing or edema. Rectal: Deferred until colonoscopy   Impression:  Pleasant 63 year old gentleman with history of colonic  adenoma, previously high-grade, and a positive family history of colon cancer in first-degree relative. He also has a history of erosive reflux esophagitis now has soft of dysphagia.  He needs a surveillance colonoscopy and an EGD with possible soft tissue dilation. He is anticoagulated. Had a complicated course with septic shock on his cholecystectomy which followed EGD colonoscopy him back in 2000.  Is concerned about the possibility of a post-endoscopy infection. He reportedly did not develop endocarditis following his procedures or cholecystectomy 5 years ago.    Recommendations:  Schedule an EGD with possible dilation and suveillance colonoscopy - will need Propofol  Hold Coumadin x 4 days prior to procedures  Begin Lovenox 125 mg SQ 3 days prior to procedure  No coumadin or Lovenox the day of the procedure  Split movie prep   Although no longer recommended, we'll go and give him ampicillin 2 g IV prior to his endoscopic procedures as his unique set of circumstances warrant special consideration. This  approach was discussed with the patient at length today.       Notice: This dictation was prepared with Dragon dictation along with smaller phrase technology. Any transcriptional errors that result from this process are unintentional and may not be corrected upon review.

## 2014-03-08 NOTE — Patient Instructions (Addendum)
Schedule an EGD with possible dilation and suveillance colonoscopy - will need Propofol  Hold Coumadin x 4 days prior to procedures  Begin Lovenox 125 mg SQ 3 days prior to procedure  No coumadin or Lovenox the day of the procedure  Split movie prep   I will plan to give prophylactic antibiotics prior to the procedure given his unique set of circumstances and prior history although standard guidelines no longer recommend antibiotics for such procedures.

## 2014-03-08 NOTE — Telephone Encounter (Signed)
Pt had appointment today with RMR

## 2014-03-11 ENCOUNTER — Other Ambulatory Visit: Payer: Self-pay | Admitting: Internal Medicine

## 2014-03-11 DIAGNOSIS — R1319 Other dysphagia: Secondary | ICD-10-CM

## 2014-03-11 DIAGNOSIS — Z86010 Personal history of colonic polyps: Secondary | ICD-10-CM

## 2014-03-11 DIAGNOSIS — K219 Gastro-esophageal reflux disease without esophagitis: Secondary | ICD-10-CM

## 2014-03-19 ENCOUNTER — Ambulatory Visit: Payer: 59 | Admitting: Internal Medicine

## 2014-03-20 ENCOUNTER — Encounter (HOSPITAL_COMMUNITY): Payer: Self-pay | Admitting: Pharmacy Technician

## 2014-03-22 ENCOUNTER — Telehealth: Payer: Self-pay | Admitting: Internal Medicine

## 2014-03-22 NOTE — Telephone Encounter (Signed)
Tried to call with no answer  

## 2014-03-22 NOTE — Telephone Encounter (Signed)
PATIENT CALLED WITH QUESTIONS REGARDING HIS PROCEDURE.  PLEASE CALL

## 2014-03-25 NOTE — Telephone Encounter (Signed)
I called the pt and reviewed the liquid diet with him. He also said to let Dr. Gala Romney know that his cardiologist has told him to take the Lovenox 125 mg SQ bid, and none AM of the procedure.  I called and spoke to [place] in Endo, she made a note of the ampicillin 2 g IV prior to procedure.

## 2014-03-25 NOTE — Telephone Encounter (Signed)
Noted  

## 2014-03-27 NOTE — Patient Instructions (Signed)
Lucas Bowman  03/27/2014   Your procedure is scheduled on:  04/04/2014  Report to [facility] at 8:45 AM.  Call this number if you have problems the morning of surgery: [phone]   Remember:   Do not eat food or drink liquids after midnight.   Take these medicines the morning of surgery with A SIP OF WATER:   Allopurinol, Coreg, Flexeril, Digoxin, Hydrocodone, Claritin, Zofran, Protonix   Do not wear jewelry, make-up or nail polish.  Do not wear lotions, powders, or perfumes. You may wear deodorant.  Do not shave 48 hours prior to surgery. Men may shave face and neck.  Do not bring valuables to the hospital.  [name] is not responsible for any belongings or valuables.               Contacts, dentures or bridgework may not be worn into surgery.  Leave suitcase in the car. After surgery it may be brought to your room.  For patients admitted to the hospital, discharge time is determined by your treatment team.               Patients discharged the day of surgery will not be allowed to drive home.  Name and phone number of your driver:   Special Instructions: N/A   Please read over the following fact sheets that you were given: Anesthesia Post-op Instructions   PATIENT INSTRUCTIONS POST-ANESTHESIA  IMMEDIATELY FOLLOWING SURGERY:  Do not drive or operate machinery for the first twenty four hours after surgery.  Do not make any important decisions for twenty four hours after surgery or while taking narcotic pain medications or sedatives.  If you develop intractable nausea and vomiting or a severe headache please notify your doctor immediately.  FOLLOW-UP:  Please make an appointment with your surgeon as instructed. You do not need to follow up with anesthesia unless specifically instructed to do so.  WOUND CARE INSTRUCTIONS (if applicable):  Keep a dry clean dressing on the anesthesia/puncture wound site if there is drainage.  Once the wound has quit draining you may leave it  open to air.  Generally you should leave the bandage intact for twenty four hours unless there is drainage.  If the epidural site drains for more than 36-48 hours please call the anesthesia department.  QUESTIONS?:  Please feel free to call your physician or the hospital operator if you have any questions, and they will be happy to assist you.      Esophageal Dilatation The esophagus is the long, narrow tube which carries food and liquid from the mouth to the stomach. Esophageal dilatation is the technique used to stretch a blocked or narrowed portion of the esophagus. This procedure is used when a part of the esophagus has become so narrow that it becomes difficult, painful or even impossible to swallow. This is generally an uncomplicated form of treatment. When this is not successful, chest surgery may be required. This is a much more extensive form of treatment with a longer recovery time. CAUSES  Some of the more common causes of blockage or strictures of the esophagus are:  Narrowing from longstanding inflammation (soreness and redness) of the lower esophagus. This comes from the constant exposure of the lower esophagus to the acid which bubbles up from the stomach. Over time this causes scarring and narrowing of the lower esophagus.  Hiatal hernia in which a small part of the stomach bulges (herniates) up through the diaphragm. This can cause a gradual  narrowing of the end of the esophagus.  Schatzki ring is a narrow ring of benign (non-cancerous) fibrous tissue which constricts the lower esophagus. The reason for this is not known.  Scleroderma is a connective tissue disorder that affects the esophagus and makes swallowing difficult.  Achalasia is an absence of nerves to the lower esophagus and to the esophageal sphincter. This is the circular muscle between the stomach and esophagus that relaxes to allow food into the stomach. After swallowing, it contracts to keep food in the stomach. This  absence of nerves may be congenital (present since birth). This can cause irregular spasms of the lower esophageal muscle. This spasm does not open up to allow food and fluid through. The result is a persistent blockage with subsequent slow trickling of the esophageal contents into the stomach.  Strictures may develop from swallowing materials which damage the esophagus. Some examples are strong acids or alkalis such as lye.  Growths such as benign (non-cancerous) and malignant (cancerous) tumors can block the esophagus.  Hereditary (present since birth) causes. DIAGNOSIS  Your caregiver often suspects this problem by taking a medical history. They will also do a physical exam. They can then prove their suspicions using X-rays and endoscopy. Endoscopy is an exam in which a tube like a small, flexible telescope is used to look at your esophagus.  TREATMENT There are different stretching (dilating) techniques that can be used. Simple bougie dilatation may be done in the office. This usually takes only a couple minutes. A numbing (anesthetic) spray of the throat is used. Endoscopy, when done, is done in an endoscopy suite under mild sedation. When fluoroscopy is used, the procedure is performed in X-ray. Other techniques require a little longer time. Recovery is usually quick. There is no waiting time to begin eating and drinking to test success of the treatment. Following are some of the methods used. Narrowing of the esophagus is treated by making it bigger. Commonly this is a mechanical problem which can be treated with stretching. This can be done in different ways. Your caregiver will discuss these with you. Some of the means used are:  A series of graduated (increasing thickness) flexible dilators can be used. These are weighted tubes passed through the esophagus into the stomach. The tubes used become progressively larger until the desired stretched size is reached. Graduated dilators are a simple  and quick way of opening the esophagus. No visualization is required.  Another method is the use of endoscopy to place a flexible wire across the stricture. The endoscope is removed and the wire left in place. A dilator with a hole through it from end to end is guided down the esophagus and across the stricture. One or more of these dilators are passed over the wire. At the end of the exam, the wire is removed. This type of treatment may be performed in the X-ray department under fluoroscopy. An advantage of this procedure is the examiner is visualizing the end opening in the esophagus.  Stretching of the esophagus may be done using balloons. Deflated balloons are placed through the endoscope and across the stricture. This type of balloon dilatation is often done at the time of endoscopy or fluoroscopy. Flexible endoscopy allows the examiner to directly view the stricture. A balloon is inserted in the deflated form into the area of narrowing. It is then inflated with air to a certain pressure that is preset for a given circumference. When inflated, it becomes sausage shaped, stretched, and makes  the stricture larger.  Achalasia requires a longer, larger balloon-type dilator. This is frequently done under X-ray control. In this situation, the spastic muscle fibers in the lower esophagus are stretched. All of the above procedures make the passage of food and water into the stomach easier. They also make it easier for stomach contents to reflux back into the esophagus. Special medications may be used following the procedure to help prevent further stricturing. Proton-pump inhibitor medications are good at decreasing the amount of acid in the stomach juice. When stomach juice refluxes into the esophagus, the juice is no longer as acidic and is less likely to burn or scar the esophagus. RISKS AND COMPLICATIONS Esophageal dilatation is usually performed effectively and without problems. Some complications that  can occur are:  A small amount of bleeding almost always happens where the stretching takes place. If this is too excessive it may require more aggressive treatment.  An uncommon complication is perforation (making a hole) of the esophagus. The esophagus is thin. It is easy to make a hole in it. If this happens, an operation may be necessary to repair this.  A small, undetected perforation could lead to an infection in the chest. This can be very serious. HOME CARE INSTRUCTIONS   If you received sedation for your procedure, do not drive, make important decisions, or perform any activities requiring your full coordination. Do not drink alcohol, take sedatives, or use any mind altering chemicals unless instructed by your caregiver.  You may use throat lozenges or warm salt water gargles if you have throat discomfort.  You can begin eating and drinking normally on return home unless instructed otherwise. Do not purposely try to force large chunks of food down to test the benefits of your procedure.  Mild discomfort can be eased with sips of ice water.  Medications for discomfort may or may not be needed. SEEK IMMEDIATE MEDICAL CARE IF:   You begin vomiting up blood.  You develop black, tarry stools.  You develop chills or an unexplained temperature of over 101F (38.3C)  You develop chest or abdominal pain.  You develop shortness of breath, or feel light-headed or faint.  Your swallowing is becoming more painful, difficult, or you are unable to swallow. MAKE SURE YOU:   Understand these instructions.  Will watch your condition.  Will get help right away if you are not doing well or get worse. Document Released: 08/05/2005 Document Revised: 10/29/2013 Document Reviewed: 09/22/2005 [facility] Patient Information 2015 [place], [place]. This information is not intended to replace advice given to you by your health care provider. Make sure you discuss any questions you have with your  health care provider. Esophagogastroduodenoscopy Esophagogastroduodenoscopy (EGD) is a procedure to examine the lining of the esophagus, stomach, and first part of the small intestine (duodenum). A long, flexible, lighted tube with a camera attached (endoscope) is inserted down the throat to view these organs. This procedure is done to detect problems or abnormalities, such as inflammation, bleeding, ulcers, or growths, in order to treat them. The procedure lasts about 5-20 minutes. It is usually an outpatient procedure, but it may need to be performed in emergency cases in the hospital. LET YOUR CAREGIVER KNOW ABOUT:   Allergies to food or medicine.  All medicines you are taking, including vitamins, herbs, eyedrops, and over-the-counter medicines and creams.  Use of steroids (by mouth or creams).  Previous problems you or members of your family have had with the use of anesthetics.  Any blood disorders  you have.  Previous surgeries you have had.  Other health problems you have.  Possibility of pregnancy, if this applies. RISKS AND COMPLICATIONS  Generally, EGD is a safe procedure. However, as with any procedure, complications can occur. Possible complications include:  Infection.  Bleeding.  Tearing (perforation) of the esophagus, stomach, or duodenum.  Difficulty breathing or not being able to breath.  Excessive sweating.  Spasms of the larynx.  Slowed heartbeat.  Low blood pressure. BEFORE THE PROCEDURE  Do not eat or drink anything for 6-8 hours before the procedure or as directed by your caregiver.  Ask your caregiver about changing or stopping your regular medicines.  If you wear dentures, be prepared to remove them before the procedure.  Arrange for someone to drive you home after the procedure. PROCEDURE   A vein will be accessed to give medicines and fluids. A medicine to relax you (sedative) and a pain reliever will be given through that access into the  vein.  A numbing medicine (local anesthetic) may be sprayed on your throat for comfort and to stop you from gagging or coughing.  A mouth guard may be placed in your mouth to protect your teeth and to keep you from biting on the endoscope.  You will be asked to lie on your left side.  The endoscope is inserted down your throat and into the esophagus, stomach, and duodenum.  Air is put through the endoscope to allow your caregiver to view the lining of your esophagus clearly.  The esophagus, stomach, and duodenum is then examined. During the exam, your caregiver may:  Remove tissue to be examined under a microscope (biopsy) for inflammation, infection, or other medical problems.  Remove growths.  Remove objects (foreign bodies) that are stuck.  Treat any bleeding with medicines or other devices that stop tissues from bleeding (hot cautery, clipping devices).  Widen (dilate) or stretch narrowed areas of the esophagus and stomach.  The endoscope will then be withdrawn. AFTER THE PROCEDURE  You will be taken to a recovery area to be monitored. You will be able to go home once you are stable and alert.  Do not eat or drink anything until the local anesthetic and numbing medicines have worn off. You may choke.  It is normal to feel bloated, have pain with swallowing, or have a sore throat for a short time. This will wear off.  Your caregiver should be able to discuss his or her findings with you. It will take longer to discuss the test results if any biopsies were taken. Document Released: 10/15/2004 Document Revised: 10/29/2013 Document Reviewed: 05/17/2012 [facility] Patient Information 2015 [place], [place]. This information is not intended to replace advice given to you by your health care provider. Make sure you discuss any questions you have with your health care provider. Colonoscopy A colonoscopy is an exam to look at the entire large intestine (colon). This exam can help find  problems such as tumors, polyps, inflammation, and areas of bleeding. The exam takes about 1 hour.  LET [facility] CARE PROVIDER KNOW ABOUT:   Any allergies you have.  All medicines you are taking, including vitamins, herbs, eye drops, creams, and over-the-counter medicines.  Previous problems you or members of your family have had with the use of anesthetics.  Any blood disorders you have.  Previous surgeries you have had.  Medical conditions you have. RISKS AND COMPLICATIONS  Generally, this is a safe procedure. However, as with any procedure, complications can occur. Possible  complications include:  Bleeding.  Tearing or rupture of the colon wall.  Reaction to medicines given during the exam.  Infection (rare). BEFORE THE PROCEDURE   Ask your health care provider about changing or stopping your regular medicines.  You may be prescribed an oral bowel prep. This involves drinking a large amount of medicated liquid, starting the day before your procedure. The liquid will cause you to have multiple loose stools until your stool is almost clear or light green. This cleans out your colon in preparation for the procedure.  Do not eat or drink anything else once you have started the bowel prep, unless your health care provider tells you it is safe to do so.  Arrange for someone to drive you home after the procedure. PROCEDURE   You will be given medicine to help you relax (sedative).  You will lie on your side with your knees bent.  A long, flexible tube with a light and camera on the end (colonoscope) will be inserted through the rectum and into the colon. The camera sends video back to a computer screen as it moves through the colon. The colonoscope also releases carbon dioxide gas to inflate the colon. This helps your health care provider see the area better.  During the exam, your health care provider may take a small tissue sample (biopsy) to be examined under a microscope  if any abnormalities are found.  The exam is finished when the entire colon has been viewed. AFTER THE PROCEDURE   Do not drive for 24 hours after the exam.  You may have a small amount of blood in your stool.  You may pass moderate amounts of gas and have mild abdominal cramping or bloating. This is caused by the gas used to inflate your colon during the exam.  Ask when your test results will be ready and how you will get your results. Make sure you get your test results. Document Released: 06/11/2000 Document Revised: 04/04/2013 Document Reviewed: 02/19/2013 [facility] Patient Information 2015 [place], [place]. This information is not intended to replace advice given to you by your health care provider. Make sure you discuss any questions you have with your health care provider.

## 2014-03-28 ENCOUNTER — Other Ambulatory Visit: Payer: Self-pay

## 2014-03-28 ENCOUNTER — Encounter (HOSPITAL_COMMUNITY)
Admission: RE | Admit: 2014-03-28 | Discharge: 2014-03-28 | Disposition: A | Discharge status: Home / Self Care | Payer: Medicare Other | Source: Ambulatory Visit | Attending: Internal Medicine | Admitting: Internal Medicine

## 2014-03-28 ENCOUNTER — Encounter (HOSPITAL_COMMUNITY): Payer: Self-pay

## 2014-03-28 DIAGNOSIS — Z01810 Encounter for preprocedural cardiovascular examination: Secondary | ICD-10-CM | POA: Insufficient documentation

## 2014-03-28 DIAGNOSIS — Z86010 Personal history of colonic polyps: Secondary | ICD-10-CM | POA: Insufficient documentation

## 2014-03-28 DIAGNOSIS — Z800 Family history of malignant neoplasm of digestive organs: Secondary | ICD-10-CM | POA: Insufficient documentation

## 2014-03-28 DIAGNOSIS — Z01812 Encounter for preprocedural laboratory examination: Secondary | ICD-10-CM | POA: Insufficient documentation

## 2014-03-28 LAB — SURGICAL PCR SCREEN
MRSA, PCR: NEGATIVE
Staphylococcus aureus: NEGATIVE

## 2014-03-28 LAB — BASIC METABOLIC PANEL
ANION GAP: 14 (ref 5–15)
BUN: 17 mg/dL (ref 6–23)
CO2: 28 mEq/L (ref 19–32)
CREATININE: 0.7 mg/dL (ref 0.50–1.35)
Calcium: 10 mg/dL (ref 8.4–10.5)
Chloride: 99 mEq/L (ref 96–112)
Glucose, Bld: 92 mg/dL (ref 70–99)
Potassium: 4.4 mEq/L (ref 3.7–5.3)
Sodium: 141 mEq/L (ref 137–147)

## 2014-03-28 LAB — APTT: APTT: 64 s — AB (ref 24–37)

## 2014-03-28 LAB — HEMOGLOBIN AND HEMATOCRIT, BLOOD
HEMATOCRIT: 41.6 % (ref 39.0–52.0)
HEMOGLOBIN: 13.9 g/dL (ref 13.0–17.0)

## 2014-03-28 LAB — PROTIME-INR
INR: 3.28 — AB (ref 0.00–1.49)
Prothrombin Time: 33.4 seconds — ABNORMAL HIGH (ref 11.6–15.2)

## 2014-03-28 NOTE — Pre-Procedure Instructions (Signed)
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  Last dose of Coumadin is on Oct 3 and will be covered with lovenox, per cardiology.  Patient has post procedure instructions regarding restarting lovenox and coumadin.  No PT/NR needed day of procedure, per Dr Patsey Berthold.

## 2014-04-03 ENCOUNTER — Telehealth: Payer: Self-pay

## 2014-04-03 NOTE — Telephone Encounter (Signed)
Pt called with questions about his meds for the day of prep. I reviewed and told him to take BP meds with sip of water but to hold the others until after the procedure. He said he started his diet on time Tues afternoon.

## 2014-04-04 ENCOUNTER — Encounter (HOSPITAL_COMMUNITY): Payer: Self-pay | Admitting: *Deleted

## 2014-04-04 ENCOUNTER — Ambulatory Visit (HOSPITAL_COMMUNITY)
Admission: RE | Admit: 2014-04-04 | Discharge: 2014-04-04 | Disposition: A | Discharge status: Home / Self Care | Payer: Medicare Other | Source: Ambulatory Visit | Attending: Internal Medicine | Admitting: Internal Medicine

## 2014-04-04 ENCOUNTER — Encounter (HOSPITAL_COMMUNITY)
Admission: RE | Disposition: A | Discharge status: Home / Self Care | Payer: Self-pay | Source: Ambulatory Visit | Attending: Internal Medicine

## 2014-04-04 ENCOUNTER — Ambulatory Visit (HOSPITAL_COMMUNITY): Payer: Medicare Other | Admitting: Anesthesiology

## 2014-04-04 ENCOUNTER — Encounter (HOSPITAL_COMMUNITY): Payer: Medicare Other | Admitting: Anesthesiology

## 2014-04-04 DIAGNOSIS — K219 Gastro-esophageal reflux disease without esophagitis: Secondary | ICD-10-CM | POA: Insufficient documentation

## 2014-04-04 DIAGNOSIS — Z86010 Personal history of colonic polyps: Secondary | ICD-10-CM | POA: Insufficient documentation

## 2014-04-04 DIAGNOSIS — M109 Gout, unspecified: Secondary | ICD-10-CM | POA: Insufficient documentation

## 2014-04-04 DIAGNOSIS — D125 Benign neoplasm of sigmoid colon: Secondary | ICD-10-CM | POA: Diagnosis not present

## 2014-04-04 DIAGNOSIS — R1310 Dysphagia, unspecified: Secondary | ICD-10-CM | POA: Insufficient documentation

## 2014-04-04 DIAGNOSIS — R1314 Dysphagia, pharyngoesophageal phase: Secondary | ICD-10-CM

## 2014-04-04 DIAGNOSIS — K635 Polyp of colon: Secondary | ICD-10-CM | POA: Diagnosis not present

## 2014-04-04 DIAGNOSIS — K589 Irritable bowel syndrome without diarrhea: Secondary | ICD-10-CM | POA: Diagnosis not present

## 2014-04-04 DIAGNOSIS — K2970 Gastritis, unspecified, without bleeding: Secondary | ICD-10-CM

## 2014-04-04 DIAGNOSIS — Z1211 Encounter for screening for malignant neoplasm of colon: Secondary | ICD-10-CM | POA: Insufficient documentation

## 2014-04-04 DIAGNOSIS — K2990 Gastroduodenitis, unspecified, without bleeding: Secondary | ICD-10-CM

## 2014-04-04 DIAGNOSIS — Z952 Presence of prosthetic heart valve: Secondary | ICD-10-CM | POA: Insufficient documentation

## 2014-04-04 DIAGNOSIS — K5730 Diverticulosis of large intestine without perforation or abscess without bleeding: Secondary | ICD-10-CM | POA: Insufficient documentation

## 2014-04-04 DIAGNOSIS — I340 Nonrheumatic mitral (valve) insufficiency: Secondary | ICD-10-CM | POA: Diagnosis not present

## 2014-04-04 DIAGNOSIS — F329 Major depressive disorder, single episode, unspecified: Secondary | ICD-10-CM | POA: Insufficient documentation

## 2014-04-04 DIAGNOSIS — Z882 Allergy status to sulfonamides status: Secondary | ICD-10-CM | POA: Diagnosis not present

## 2014-04-04 HISTORY — PX: BIOPSY: SHX5522

## 2014-04-04 HISTORY — PX: COLONOSCOPY WITH PROPOFOL: SHX5780

## 2014-04-04 HISTORY — PX: MALONEY DILATION: SHX5535

## 2014-04-04 HISTORY — PX: ESOPHAGOGASTRODUODENOSCOPY (EGD) WITH PROPOFOL: SHX5813

## 2014-04-04 HISTORY — PX: POLYPECTOMY: SHX5525

## 2014-04-04 SURGERY — COLONOSCOPY WITH PROPOFOL
Anesthesia: Monitor Anesthesia Care | Site: Esophagus

## 2014-04-04 MED ORDER — MIDAZOLAM HCL 5 MG/5ML IJ SOLN
INTRAMUSCULAR | Status: DC | PRN
  Administered 2014-04-04: 2 mg via INTRAVENOUS

## 2014-04-04 MED ORDER — LIDOCAINE VISCOUS 2 % MT SOLN
4.0000 mL | Freq: Once | OROMUCOSAL | Status: AC
  Administered 2014-04-04: 4 mL via OROMUCOSAL
  Filled 2014-04-04: qty 5

## 2014-04-04 MED ORDER — LIDOCAINE VISCOUS 2 % MT SOLN
OROMUCOSAL | Status: AC
  Filled 2014-04-04: qty 15

## 2014-04-04 MED ORDER — ONDANSETRON HCL 4 MG/2ML IJ SOLN
INTRAMUSCULAR | Status: AC
  Filled 2014-04-04: qty 2

## 2014-04-04 MED ORDER — PROPOFOL 10 MG/ML IV BOLUS
INTRAVENOUS | Status: AC
Start: 2014-04-04 — End: 2014-04-04
  Filled 2014-04-04: qty 20

## 2014-04-04 MED ORDER — FENTANYL CITRATE 0.05 MG/ML IJ SOLN
INTRAMUSCULAR | Status: AC
  Filled 2014-04-04: qty 2

## 2014-04-04 MED ORDER — LACTATED RINGERS IV SOLN
INTRAVENOUS | Status: DC
  Administered 2014-04-04: 1000 mL via INTRAVENOUS

## 2014-04-04 MED ORDER — STERILE WATER FOR IRRIGATION IR SOLN
Status: DC | PRN
  Administered 2014-04-04: 10:00:00

## 2014-04-04 MED ORDER — SODIUM CHLORIDE 0.9 % IV SOLN
INTRAVENOUS | Status: AC
  Filled 2014-04-04: qty 2000

## 2014-04-04 MED ORDER — MIDAZOLAM HCL 2 MG/2ML IJ SOLN
INTRAMUSCULAR | Status: AC
  Filled 2014-04-04: qty 2

## 2014-04-04 MED ORDER — MIDAZOLAM HCL 2 MG/2ML IJ SOLN
1.0000 mg | INTRAMUSCULAR | Status: DC | PRN
  Administered 2014-04-04 (×2): 2 mg via INTRAVENOUS
  Filled 2014-04-04: qty 2

## 2014-04-04 MED ORDER — ONDANSETRON HCL 4 MG/2ML IJ SOLN: 4.0000 mg | Freq: Once | INTRAMUSCULAR | Status: DC | PRN

## 2014-04-04 MED ORDER — WATER FOR IRRIGATION, STERILE IR SOLN
Status: DC | PRN
  Administered 2014-04-04: 1000 mL

## 2014-04-04 MED ORDER — FENTANYL CITRATE 0.05 MG/ML IJ SOLN
25.0000 ug | INTRAMUSCULAR | Status: AC
  Administered 2014-04-04: 25 ug via INTRAVENOUS

## 2014-04-04 MED ORDER — PROPOFOL 10 MG/ML IV BOLUS
INTRAVENOUS | Status: AC
  Filled 2014-04-04: qty 20

## 2014-04-04 MED ORDER — FENTANYL CITRATE 0.05 MG/ML IJ SOLN: 25.0000 ug | INTRAMUSCULAR | Status: DC | PRN

## 2014-04-04 MED ORDER — AMPICILLIN SODIUM 2 G IJ SOLR: 2.0000 g | Freq: Once | INTRAMUSCULAR | Status: DC

## 2014-04-04 MED ORDER — PROPOFOL INFUSION 10 MG/ML OPTIME
INTRAVENOUS | Status: DC | PRN
  Administered 2014-04-04: 90 ug/kg/min via INTRAVENOUS
  Administered 2014-04-04: 10:00:00 via INTRAVENOUS

## 2014-04-04 MED ORDER — ONDANSETRON HCL 4 MG/2ML IJ SOLN
4.0000 mg | Freq: Once | INTRAMUSCULAR | Status: AC
  Administered 2014-04-04: 4 mg via INTRAVENOUS

## 2014-04-04 SURGICAL SUPPLY — 27 items
BLOCK BITE 60FR ADLT L/F BLUE (MISCELLANEOUS) ×4 IMPLANT
DEVICE CLIP HEMOSTAT 235CM (CLIP) IMPLANT
ELECT REM PT RETURN 9FT ADLT (ELECTROSURGICAL)
ELECTRODE REM PT RTRN 9FT ADLT (ELECTROSURGICAL) IMPLANT
FCP BXJMBJMB 240X2.8X (CUTTING FORCEPS)
FLOOR PAD 36X40 (MISCELLANEOUS) ×4
FORCEPS BIOP RAD 4 LRG CAP 4 (CUTTING FORCEPS) ×4 IMPLANT
FORCEPS BIOP RJ4 240 W/NDL (CUTTING FORCEPS)
FORCEPS BXJMBJMB 240X2.8X (CUTTING FORCEPS) IMPLANT
FORMALIN 10 PREFIL 20ML (MISCELLANEOUS) ×6 IMPLANT
INJECTOR/SNARE I SNARE (MISCELLANEOUS) IMPLANT
KIT CLEAN ENDO COMPLIANCE (KITS) ×4 IMPLANT
LUBRICANT JELLY 4.5OZ STERILE (MISCELLANEOUS) ×2 IMPLANT
MANIFOLD NEPTUNE II (INSTRUMENTS) ×2 IMPLANT
NDL SCLEROTHERAPY 25GX240 (NEEDLE) ×2 IMPLANT
NEEDLE SCLEROTHERAPY 25GX240 (NEEDLE) IMPLANT
PAD FLOOR 36X40 (MISCELLANEOUS) ×2 IMPLANT
PROBE APC STR FIRE (PROBE) ×2 IMPLANT
PROBE INJECTION GOLD (MISCELLANEOUS)
PROBE INJECTION GOLD 7FR (MISCELLANEOUS) ×2 IMPLANT
SNARE ROTATE MED OVAL 20MM (MISCELLANEOUS) ×2 IMPLANT
SNARE SHORT THROW 13M SML OVAL (MISCELLANEOUS) ×2 IMPLANT
SYR 50ML LL SCALE MARK (SYRINGE) ×2 IMPLANT
TRAP SPECIMEN MUCOUS 40CC (MISCELLANEOUS) IMPLANT
TUBING ENDO SMARTCAP PENTAX (MISCELLANEOUS) ×4 IMPLANT
TUBING IRRIGATION ENDOGATOR (MISCELLANEOUS) ×4 IMPLANT
WATER STERILE IRR 1000ML POUR (IV SOLUTION) ×2 IMPLANT

## 2014-04-04 NOTE — Discharge Instructions (Addendum)
Colonoscopy Discharge Instructions  Read the instructions outlined below and refer to this sheet in the next few weeks. These discharge instructions provide you with general information on caring for yourself after you leave the hospital. Your doctor may also give you specific instructions. While your treatment has been planned according to the most current medical practices available, unavoidable complications occasionally occur. If you have any problems or questions after discharge, call Dr. Gala Romney at [phone]. ACTIVITY  You may resume your regular activity, but move at a slower pace for the next 24 hours.   Take frequent rest periods for the next 24 hours.   Walking will help get rid of the air and reduce the bloated feeling in your belly (abdomen).   No driving for 24 hours (because of the medicine (anesthesia) used during the test).    Do not sign any important legal documents or operate any machinery for 24 hours (because of the anesthesia used during the test).  NUTRITION  Drink plenty of fluids.   You may resume your normal diet as instructed by your doctor.   Begin with a light meal and progress to your normal diet. Heavy or fried foods are harder to digest and may make you feel sick to your stomach (nauseated).   Avoid alcoholic beverages for 24 hours or as instructed.  MEDICATIONS  You may resume your normal medications unless your doctor tells you otherwise.  WHAT YOU CAN EXPECT TODAY  Some feelings of bloating in the abdomen.   Passage of more gas than usual.   Spotting of blood in your stool or on the toilet paper.  IF YOU HAD POLYPS REMOVED DURING THE COLONOSCOPY:  No aspirin products for 7 days or as instructed.   No alcohol for 7 days or as instructed.   Eat a soft diet for the next 24 hours.  FINDING OUT THE RESULTS OF YOUR TEST Not all test results are available during your visit. If your test results are not back during the visit, make an appointment  with your caregiver to find out the results. Do not assume everything is normal if you have not heard from your caregiver or the medical facility. It is important for you to follow up on all of your test results.  SEEK IMMEDIATE MEDICAL ATTENTION IF:  You have more than a spotting of blood in your stool.   Your belly is swollen (abdominal distention).   You are nauseated or vomiting.   You have a temperature over 101.  You have abdominal pain or discomfort that is severe or gets worse throughout the day. EGD Discharge instructions Please read the instructions outlined below and refer to this sheet in the next few weeks. These discharge instructions provide you with general information on caring for yourself after you leave the hospital. Your doctor may also give you specific instructions. While your treatment has been planned according to the most current medical practices available, unavoidable complications occasionally occur. If you have any problems or questions after discharge, please call your doctor. ACTIVITY You may resume your regular activity but move at a slower pace for the next 24 hours.  Take frequent rest periods for the next 24 hours.  Walking will help expel (get rid of) the air and reduce the bloated feeling in your abdomen.  No driving for 24 hours (because of the anesthesia (medicine) used during the test).  You may shower.  Do not sign any important legal documents or operate any machinery for 24  hours (because of the anesthesia used during the test).  NUTRITION Drink plenty of fluids.  You may resume your normal diet.  Begin with a light meal and progress to your normal diet.  Avoid alcoholic beverages for 24 hours or as instructed by your caregiver.  MEDICATIONS You may resume your normal medications unless your caregiver tells you otherwise.  WHAT YOU CAN EXPECT TODAY You may experience abdominal discomfort such as a feeling of fullness or gas pains.   FOLLOW-UP Your doctor will discuss the results of your test with you.  SEEK IMMEDIATE MEDICAL ATTENTION IF ANY OF THE FOLLOWING OCCUR: Excessive nausea (feeling sick to your stomach) and/or vomiting.  Severe abdominal pain and distention (swelling).  Trouble swallowing.  Temperature over 101 F (37.8 C).  Rectal bleeding or vomiting of blood.    Polyp information provided  Continue Protonix 40 mg daily  Resume Lovenox with (1) 100 mg injection subcutaneously this evening; then 100 mg subcutaneously twice daily starting tomorrow; continue until INR is satisfactory  Resume Coumadin today  Obtain an INR on October 12 at your primary care doctor's office  Further recommendations to follow pending review of pathology report.    Colon Polyps Polyps are lumps of extra tissue growing inside the body. Polyps can grow in the large intestine (colon). Most colon polyps are noncancerous (benign). However, some colon polyps can become cancerous over time. Polyps that are larger than a pea may be harmful. To be safe, caregivers remove and test all polyps. CAUSES  Polyps form when mutations in the genes cause your cells to grow and divide even though no more tissue is needed. RISK FACTORS There are a number of risk factors that can increase your chances of getting colon polyps. They include:  Being older than 50 years.  Family history of colon polyps or colon cancer.  Long-term colon diseases, such as colitis or Crohn disease.  Being overweight.  Smoking.  Being inactive.  Drinking too much alcohol. SYMPTOMS  Most small polyps do not cause symptoms. If symptoms are present, they may include:  Blood in the stool. The stool may look dark red or black.  Constipation or diarrhea that lasts longer than 1 week. DIAGNOSIS People often do not know they have polyps until their caregiver finds them during a regular checkup. Your caregiver can use 4 tests to check for polyps:  Digital  rectal exam. The caregiver wears gloves and feels inside the rectum. This test would find polyps only in the rectum.  Barium enema. The caregiver puts a liquid called barium into your rectum before taking X-rays of your colon. Barium makes your colon look white. Polyps are dark, so they are easy to see in the X-ray pictures.  Sigmoidoscopy. A thin, flexible tube (sigmoidoscope) is placed into your rectum. The sigmoidoscope has a light and tiny camera in it. The caregiver uses the sigmoidoscope to look at the last third of your colon.  Colonoscopy. This test is like sigmoidoscopy, but the caregiver looks at the entire colon. This is the most common method for finding and removing polyps. TREATMENT  Any polyps will be removed during a sigmoidoscopy or colonoscopy. The polyps are then tested for cancer. PREVENTION  To help lower your risk of getting more colon polyps:  Eat plenty of fruits and vegetables. Avoid eating fatty foods.  Do not smoke.  Avoid drinking alcohol.  Exercise every day.  Lose weight if recommended by your caregiver.  Eat plenty of calcium and folate. Foods  that are rich in calcium include milk, cheese, and broccoli. Foods that are rich in folate include chickpeas, kidney beans, and spinach. HOME CARE INSTRUCTIONS Keep all follow-up appointments as directed by your caregiver. You may need periodic exams to check for polyps. SEEK MEDICAL CARE IF: You notice bleeding during a bowel movement. Document Released: 03/10/2004 Document Revised: 09/06/2011 Document Reviewed: 08/24/2011 [facility] Patient Information 2015 [place], [place]. This information is not intended to replace advice given to you by your health care provider. Make sure you discuss any questions you have with your health care provider.

## 2014-04-04 NOTE — Interval H&P Note (Signed)
History and Physical Interval Note:  04/04/2014 9:02 AM  Lucas Bowman  has presented today for surgery, with the diagnosis of HISTORY OF COLON POLYPS  AND FAMILY HISTORY OF COLON CANCER  The various methods of treatment have been discussed with the patient and family. After consideration of risks, benefits and other options for treatment, the patient has consented to  Procedure(s) with comments: COLONOSCOPY WITH PROPOFOL (N/A) - 10:15 ESOPHAGOGASTRODUODENOSCOPY (EGD) WITH PROPOFOL (N/A) SAVORY DILATION (N/A) MALONEY DILATION (N/A) as a surgical intervention .  The patient's history has been reviewed, patient examined, no change in status, stable for surgery.  I have reviewed the patient's chart and labs.  Questions were answered to the patient's satisfaction.     Manus Rudd  Patient reassessed. No change. Coumadin held x4 days. Has been bridged with Lovenox per his cardiologist, Dr. Claudie Fisherman. Ampicillin given as prophylactic antibiotic. EGD with esophageal dilation etc. and colonoscopy for surveillance per plan.The risks, benefits, limitations, imponderables and alternatives regarding both EGD and colonoscopy have been reviewed with the patient. Questions have been answered. All parties agreeable.

## 2014-04-04 NOTE — Anesthesia Preprocedure Evaluation (Signed)
Anesthesia Evaluation  Patient identified by MRN, date of birth, ID band Patient awake    Reviewed: Allergy & Precautions, H&P , NPO status , Patient's Chart, lab work & pertinent test results  Airway Mallampati: II TM Distance: >3 FB     Dental  (+) Teeth Intact, Poor Dentition   Pulmonary neg pulmonary ROS,  breath sounds clear to auscultation        Cardiovascular hypertension, Pt. on medications + dysrhythmias Atrial Fibrillation + Valvular Problems/Murmurs MR Rhythm:Irregular Rate:Normal     Neuro/Psych PSYCHIATRIC DISORDERS Depression    GI/Hepatic hiatal hernia, GERD-  ,  Endo/Other    Renal/GU      Musculoskeletal  (+) Arthritis -,   Abdominal   Peds  Hematology  (+) anemia ,   Anesthesia Other Findings   Reproductive/Obstetrics                           Anesthesia Physical Anesthesia Plan  ASA: III  Anesthesia Plan: MAC   Post-op Pain Management:    Induction: Intravenous  Airway Management Planned: Simple Face Mask  Additional Equipment:   Intra-op Plan:   Post-operative Plan:   Informed Consent: I have reviewed the patients History and Physical, chart, labs and discussed the procedure including the risks, benefits and alternatives for the proposed anesthesia with the patient or authorized representative who has indicated his/her understanding and acceptance.     Plan Discussed with:   Anesthesia Plan Comments:         Anesthesia Quick Evaluation

## 2014-04-04 NOTE — H&P (View-Only) (Signed)
Primary Care Physician:  Michel Bickers, MD Primary Gastroenterologist:  Dr. Gala Romney  Pre-Procedure History & Physical: HPI:  Lucas Bowman is a 63 y.o. male here for followup for consideration of a surveillance colonoscopy; history of high-grade colonic adenomas and a positive family history of colon cancer in a first degree relative at at a young age. Patient has done well from a GI standpoint;  he may have scant rectal bleeding on occasion. GERD fairly well controlled on Protonix. However, has had intermittent esophageal dysphagia and is concerned about those symptoms. I last saw him in 2007 when he underwent an EGD and a colonoscopy  -  small adenoma removed from his left colon and mild erosive reflux esophagitis along with small hiatal hernia.  It is notable patient had had all kinds of problems after seeing me for EGD and colonoscopy. Subsequent this, he underwent a cholecystectomy postoperatively he developed septic shock and was admitted to the hospital in [place].  He suffered a fall and a subdural hematoma about 3 years ago was hospitalized at [facility]. He developed rapid atrial fib requiring cardioversion around that time as well. He continues to be anticoagulated. He has had progressive difficulties with conscious sedation being more and more difficult to adequately sedate (Demerol 150 mg and Versed 10 mg during endoscopy 2000 (states sedation was inadequate). Had a very difficult time with conscious sedation for a TEE 3 years ago.    Past Medical History  Diagnosis Date  . Septic shock 03/06/2009  . Anemia   . Neck pain   . GERD (gastroesophageal reflux disease)   . Hiatal hernia   . H/O aortic valve replacement   . Gout   . Depression   . Mitral insufficiency   . Hemorrhoids   . Gastritis   . Tubular adenoma   . Internal hemorrhoids   . Irritable bowel syndrome     Past Surgical History  Procedure Laterality Date  . Coronary angioplasty    .  Esophagogastroduodenoscopy  01/28/2009    SHF:WYOV distal esophageal erosions consistent with mild erosive reflux esophagitis/ Small hiatal hernia otherwise.  Upper GI tract appeared normal.  . Colonoscopy  01/28/2009    RMR: Internal hemorrhoids, otherwise normal rectum/Sigmoid polyp, status post hot snare polypectomy.ADENOMATOUS POLYP   . Colonoscopy  02/17/2006    Dr.Ludie Pavlik-Normal rectum.  Left colon polyps= tubular adenoma,Remainder of the colonic mucosa appeared normal.   . Esophagogastroduodenoscopy  02/17/2006    Dr.Anavey Coombes-Normal esophagus, small hiatal hernia, otherwise normal stomach, D1 and D2  . Gallbladder surgery      Prior to Admission medications   Medication Sig Start Date End Date Taking? Authorizing Provider  allopurinol (ZYLOPRIM) 300 MG tablet Take 300 mg by mouth daily.  01/28/14  Yes Historical Provider, MD  carvedilol (COREG) 6.25 MG tablet 6.25 mg 2 (two) times daily with a meal.  02/12/14  Yes Historical Provider, MD  cyclobenzaprine (FLEXERIL) 10 MG tablet Take 10 mg by mouth at bedtime.   Yes Historical Provider, MD  desloratadine (CLARINEX) 5 MG tablet Take 5 mg by mouth daily.   Yes Historical Provider, MD  diazepam (VALIUM) 5 MG tablet Take 5 mg by mouth.  01/29/14  Yes Historical Provider, MD  [place] 125 MCG tablet Take 0.125 mg by mouth daily.  02/11/14  Yes Historical Provider, MD  furosemide (LASIX) 20 MG tablet Take 20 mg by mouth daily.  02/12/14  Yes Historical Provider, MD  gemfibrozil (LOPID) 600 MG tablet Take 600 mg by  mouth 2 (two) times daily before a meal.  02/04/14  Yes Historical Provider, MD  HYDROcodone-acetaminophen (NORCO) 10-325 MG per tablet Take 1 tablet by mouth every 6 (six) hours as needed.  02/18/14  Yes Historical Provider, MD  pantoprazole (PROTONIX) 40 MG tablet Take 40 mg by mouth daily.  01/28/14  Yes Historical Provider, MD  Vitamin D, Ergocalciferol, (DRISDOL) 50000 UNITS CAPS capsule Take 50,000 Units by mouth every 7 (seven) days.   Yes  Historical Provider, MD  warfarin (COUMADIN) 5 MG tablet Take 5 mg by mouth daily at 6 PM.  12/17/13  Yes Historical Provider, MD  ondansetron (ZOFRAN-ODT) 4 MG disintegrating tablet  02/05/14   Historical Provider, MD  temazepam (RESTORIL) 30 MG capsule  02/26/14   Historical Provider, MD    Allergies as of 03/08/2014 - Review Complete 03/08/2014  Allergen Reaction Noted  . Sulfonamide derivatives      No family history on file.  History   Social History  . Marital Status: Married    Spouse Name: N/A    Number of Children: N/A  . Years of Education: N/A   Occupational History  . Not on file.   Social History Main Topics  . Smoking status: Never Smoker   . Smokeless tobacco: Not on file  . Alcohol Use: No  . Drug Use: No  . Sexual Activity: Not on file   Other Topics Concern  . Not on file   Social History Narrative  . No narrative on file    Review of Systems: See HPI, otherwise negative ROS  Physical Exam: BP 138/81  Pulse 61  Temp(Src) 98.4 F (36.9 C) (Oral)  Ht 5\' 10"  (1.778 m)  Wt 189 lb 9.6 oz (86.002 kg)  BMI 27.20 kg/m2 General:  Pleasant, alert, somewhat chronically ill-appearing. No acute distress. Skin:  Intact without significant lesions or rashes. Eyes:  Sclera clear, no icterus.   Conjunctiva pink. Ears:  Normal auditory acuity. Nose:  No deformity, discharge,  or lesions. Mouth:  No deformity or lesions. Neck:  Supple; no masses or thyromegaly. No significant cervical adenopathy. Lungs:  Clear throughout to auscultation.   No wheezes, crackles, or rhonchi. No acute distress. Heart:  Regular rate; prominent prosthetic valve heart sounds with murmur.  Abdomen: Non-distended, normal bowel sounds.  Soft and nontender without appreciable mass or hepatosplenomegaly.  Pulses:  Normal pulses noted. Extremities:  Without clubbing or edema. Rectal: Deferred until colonoscopy   Impression:  Pleasant 63 year old gentleman with history of colonic  adenoma, previously high-grade, and a positive family history of colon cancer in first-degree relative. He also has a history of erosive reflux esophagitis now has soft of dysphagia.  He needs a surveillance colonoscopy and an EGD with possible soft tissue dilation. He is anticoagulated. Had a complicated course with septic shock on his cholecystectomy which followed EGD colonoscopy him back in 2000.  Is concerned about the possibility of a post-endoscopy infection. He reportedly did not develop endocarditis following his procedures or cholecystectomy 5 years ago.    Recommendations:  Schedule an EGD with possible dilation and suveillance colonoscopy - will need Propofol  Hold Coumadin x 4 days prior to procedures  Begin Lovenox 125 mg SQ 3 days prior to procedure  No coumadin or Lovenox the day of the procedure  Split movie prep   Although no longer recommended, we'll go and give him ampicillin 2 g IV prior to his endoscopic procedures as his unique set of circumstances warrant special consideration. This  approach was discussed with the patient at length today.       Notice: This dictation was prepared with Dragon dictation along with smaller phrase technology. Any transcriptional errors that result from this process are unintentional and may not be corrected upon review.

## 2014-04-04 NOTE — Op Note (Addendum)
[facility] [address] [place], [address]   COLONOSCOPY PROCEDURE REPORT  PATIENT: Lucas Bowman, Lucas Bowman  MR#: [id number] BIRTHDATE: [birth year] , 65  yrs. old GENDER: male ENDOSCOPIST: R.  Garfield Cornea, MD [facility] REFERRED BY:   doctors Loney Loh PROCEDURE DATE:  2014-04-13 PROCEDURE:   Colonoscopy with biopsy and Colonoscopy with cold biopsy polypectomy INDICATIONS:History of high-grade colonic adenoma; surveillance examination.Marland Kitchen MEDICATIONS: deep sedation per Dr.  [facility] ASA CLASS:  CONSENT: The risks, benefits, alternatives and imponderables including but not limited to bleeding, perforation as well as the possibility of a missed lesion have been reviewed.  The potential for biopsy, lesion removal, etc. have also been discussed. Questions have been answered.  All parties agreeable.  Please see the history and physical in the medical record for more information.  DESCRIPTION OF PROCEDURE:   After the risks benefits and alternatives of the procedure were thoroughly explained, informed consent was obtained.  The digital rectal exam revealed no abnormalities of the rectum.   The     endoscope was introduced through the anus and advanced to the cecum, which was identified by both the appendix and ileocecal valve. No adverse events experienced.   The quality of the prep was adequate.  The instrument was then slowly withdrawn as the colon was fully examined.      COLON FINDINGS: The patient had (1) diminutive polyp in the ascending segment and (2) diminutive polyps in the mid sigmoid segment.  There are also scattered shallow mild sigmoid diverticula; otherwise, the remainder of the rectal and colonic mucosa appeared normal.  The above-mentioned polyps were all cold biopsied/removed. Retroflexion was performed. .  Withdrawal time=14 minutes 0 seconds.  The scope was withdrawn and the procedure completed. COMPLICATIONS:  There were no immediate complications.  ENDOSCOPIC IMPRESSION: Multiple diminutive polyps-removed as described above. Colonic diverticulosis-minimal .. RECOMMENDATIONS: Followup on pathology. See EGD report. Resume Coumadin today. Resume Lovenox today. Continue Lovenox until INR therapeutic. Further recommendations to follow pending review of pathology report.  eSigned:  R. Garfield Cornea, MD Rosalita Chessman [facility] 2014/04/13 11:07 AM Revised: 04-13-14 11:07 AM  cc:  CPT CODES: ICD CODES:  The ICD and CPT codes recommended by this software are interpretations from the data that the clinical staff has captured with the software.  The verification of the translation of this report to the ICD and CPT codes and modifiers is the sole responsibility of the health care institution and practicing physician where this report was generated.  [employer] will not be held responsible for the validity of the ICD and CPT codes included on this report.  AMA assumes no liability for data contained or not contained herein. CPT is a Designer, television/film set of the [employer].  PATIENT NAME:  Lucas Bowman, Lucas Bowman MR#: [id number]

## 2014-04-04 NOTE — Anesthesia Postprocedure Evaluation (Signed)
  Anesthesia Post-op Note  Patient: Lucas Bowman  Procedure(s) Performed: Procedure(s) with comments: COLONOSCOPY WITH PROPOFOL (N/A) - 1018 in cecum, total withdrawal time:  ESOPHAGOGASTRODUODENOSCOPY (EGD) WITH PROPOFOL (N/A) MALONEY DILATION (N/A) - #56 maloney dilator  Patient Location: PACU  Anesthesia Type:MAC  Level of Consciousness: awake, alert , oriented and patient cooperative  Airway and Oxygen Therapy: Patient Spontanous Breathing and Patient connected to face mask oxygen  Post-op Pain: none  Post-op Assessment: Post-op Vital signs reviewed, Patient's Cardiovascular Status Stable and Respiratory Function Stable  Post-op Vital Signs: Reviewed and stable  Last Vitals:  Filed Vitals:   04/04/14 1043  BP:   Temp: 36.4 C  Resp:     Complications: No apparent anesthesia complications

## 2014-04-04 NOTE — Op Note (Signed)
[facility] [address] [place], [address]   ENDOSCOPY PROCEDURE REPORT  PATIENT: Lucas Bowman, Lucas Bowman  MR#: [id number] BIRTHDATE: 12-13-1950 , 9  yrs. old GENDER: male ENDOSCOPIST: R.  Garfield Cornea, MD FACP [facility] REFERRED BY:     Dr. Eber Hong, Dr. Claudie Fisherman (PCP and cardiologist, respectively is ( Dr. Michel Bickers) PROCEDURE DATE:  04/10/2014 PROCEDURE:  EGD with Venia Minks dilation of esophagus and EGD w/ biopsy  INDICATIONS:  dysphagia. MEDICATIONS: deep sedation per Dr.  [facility] Ampicillin 2 g IV. ASA CLASS:      Class III  CONSENT: The risks, benefits, limitations, alternatives and imponderables have been discussed.  The potential for biopsy, esophogeal dilation, etc. have also been reviewed.  Questions have been answered.  All parties agreeable.  Please see the history and physical in the medical record for more information.  DESCRIPTION OF PROCEDURE: After the risks benefits and alternatives of the procedure were thoroughly explained, informed consent was obtained.  The    endoscope was introduced through the mouth and advanced to the second portion of the duodenum , limited by Without limitations. The instrument was slowly withdrawn as the mucosa was fully examined.    Normal-appearing patent tubular esophagus throughout its course. Stomach empty diffuse mildly "scarlitiniform-appearing" mucosa.  No ulcer or infiltrating process.  Patent pylorus.  Normal first and second portion of the duodenum.  A 56 French Maloney dilator was passed to full insertion easily.  A look back revealed no apparent complication related to this maneuver.  Subsequently, biopsies of the abnormal gastric mucosa taken for histologic study.  Retroflexed views revealed as previously described.     The scope was then withdrawn from the patient and the procedure completed.  COMPLICATIONS: There were no immediate complications.  ENDOSCOPIC  IMPRESSION: Normal esophagus -  status post passage of a Maloney dilator empirically. Abnormal gastric mucosa of doubtful clinical significance  -   status post gastric biopsy    .Marland Kitchen  RECOMMENDATIONS: Continue Protonix 40 mg daily. Followup on pathology. See colonoscopy report  REPEAT EXAM:  eSigned:  R. Garfield Cornea, MD Rosalita Chessman [facility] 10-Apr-2014 11:01 AM    CC:  CPT CODES: ICD CODES:  The ICD and CPT codes recommended by this software are interpretations from the data that the clinical staff has captured with the software.  The verification of the translation of this report to the ICD and CPT codes and modifiers is the sole responsibility of the health care institution and practicing physician where this report was generated.  [employer] will not be held responsible for the validity of the ICD and CPT codes included on this report.  AMA assumes no liability for data contained or not contained herein. CPT is a Designer, television/film set of the [employer].  PATIENT NAME:  Theoren, Palka MR#: [id number]

## 2014-04-04 NOTE — Transfer of Care (Signed)
Immediate Anesthesia Transfer of Care Note  Patient: Lucas Bowman  Procedure(s) Performed: Procedure(s) with comments: COLONOSCOPY WITH PROPOFOL (N/A) - 1018 in cecum, total withdrawal time:  ESOPHAGOGASTRODUODENOSCOPY (EGD) WITH PROPOFOL (N/A) MALONEY DILATION (N/A) - #56 maloney dilator  Patient Location: PACU  Anesthesia Type:MAC  Level of Consciousness: awake and patient cooperative  Airway & Oxygen Therapy: Patient Spontanous Breathing and Patient connected to face mask oxygen  Post-op Assessment: Report given to PACU RN, Post -op Vital signs reviewed and stable and Patient moving all extremities  Post vital signs: Reviewed and stable  Complications: No apparent anesthesia complications

## 2014-04-05 ENCOUNTER — Encounter (HOSPITAL_COMMUNITY): Payer: Self-pay | Admitting: Internal Medicine

## 2014-04-06 ENCOUNTER — Encounter: Payer: Self-pay | Admitting: Internal Medicine

## 2014-04-24 ENCOUNTER — Telehealth: Payer: Self-pay

## 2014-04-24 NOTE — Telephone Encounter (Signed)
Pt called- he is having some BRBPR with wiping. Pt thinks he has a hemorrhoid, he said he has had hemorrhoid surgery in the past. Pt wants to know if we can send in something.

## 2014-04-25 MED ORDER — MESALAMINE 1000 MG RE SUPP: 1000.0000 mg | Freq: Every day | RECTAL | Status: DC

## 2014-04-25 NOTE — Telephone Encounter (Signed)
Pt is aware. rx sent to the pharmacy. 

## 2014-04-25 NOTE — Telephone Encounter (Signed)
I doubt a complication from procedure. We'll try Canasa 1 g suppository 1 per rectum 10 days. Avoid constipation.

## 2019-02-16 ENCOUNTER — Encounter: Payer: Self-pay | Admitting: Internal Medicine

## 2019-03-08 ENCOUNTER — Other Ambulatory Visit: Payer: Self-pay

## 2019-03-08 ENCOUNTER — Encounter: Payer: Self-pay | Admitting: Internal Medicine

## 2019-03-08 ENCOUNTER — Ambulatory Visit (INDEPENDENT_AMBULATORY_CARE_PROVIDER_SITE_OTHER): Payer: Medicare Other | Admitting: Internal Medicine

## 2019-03-08 VITALS — BP 144/77 | HR 64 | Temp 96.8°F | Ht 68.0 in | Wt 172.6 lb

## 2019-03-08 DIAGNOSIS — Z79899 Other long term (current) drug therapy: Secondary | ICD-10-CM | POA: Diagnosis not present

## 2019-03-08 DIAGNOSIS — Z86010 Personal history of colonic polyps: Secondary | ICD-10-CM

## 2019-03-08 MED ORDER — PEG 3350-KCL-NA BICARB-NACL 420 G PO SOLR: 4000.0000 mL | ORAL | 0 refills | Status: DC

## 2019-03-08 NOTE — Progress Notes (Signed)
Primary Care Physician:  Eber Hong, MD Primary Gastroenterologist:  Dr. Gala Romney  Pre-Procedure History & Physical: HPI:  Lucas Bowman is a 68 y.o. male here for for follow-up of a colonic adenomas, positive family history of colon cancer.  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  Has intermittent paper hematochezia in the setting of chronic constipation and straining.  History of colonic adenoma.  Last colonoscopy 2015  -  revealed 2 small benign polyps.  He was bridged with Lovenox at that time.  He seen a neurologist.  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  Family history is significant and that he had a brother with colorectal cancer diagnosed at age 32.  He has a chronic anemia with a hemoglobin in the 12 range. Reflux symptoms well controlled on Protonix 40 mg daily.  Denies esophageal dysphagia.  He does have dental issues which need to be addressed.  Past Medical History:  Diagnosis Date   Anemia    Depression    Gastritis    GERD (gastroesophageal reflux disease)    Gout    H/O aortic valve replacement    Hemorrhoids    Hiatal hernia    Internal hemorrhoids    Irritable bowel syndrome    Mitral insufficiency    Neck pain    Septic shock ([place]) 03/06/2009   Tubular adenoma     Past Surgical History:  Procedure Laterality Date   BIOPSY N/A 04/04/2014   Procedure: GASTRIC BIOPSIES;  Surgeon: Daneil Dolin, MD;  Location: [facility];  Service: Endoscopy;  Laterality: N/A;   COLONOSCOPY  01/28/2009   RMR: Internal hemorrhoids, otherwise normal rectum/Sigmoid polyp, status post hot snare polypectomy.ADENOMATOUS POLYP    COLONOSCOPY  02/17/2006   Dr.Paetyn Pietrzak-Normal rectum.  Left colon polyps= tubular adenoma,Remainder of the colonic mucosa  appeared normal.    COLONOSCOPY WITH PROPOFOL N/A 04/04/2014   Procedure: COLONOSCOPY WITH PROPOFOL;  Surgeon: Daneil Dolin, MD;  Location: [facility];  Service: Endoscopy;  Laterality: N/A;  1018 in cecum, total withdrawal time: 82min   CORONARY ANGIOPLASTY     ESOPHAGOGASTRODUODENOSCOPY  01/28/2009   [id number] distal esophageal erosions consistent with mild erosive reflux esophagitis/ Small hiatal hernia otherwise.  Upper GI tract appeared normal.   ESOPHAGOGASTRODUODENOSCOPY  02/17/2006   Dr.Anajulia Leyendecker-Normal esophagus, small hiatal hernia, otherwise normal stomach, D1 and D2   ESOPHAGOGASTRODUODENOSCOPY (EGD) WITH PROPOFOL N/A 04/04/2014   Procedure: ESOPHAGOGASTRODUODENOSCOPY (EGD) WITH PROPOFOL;  Surgeon: Daneil Dolin, MD;  Location: [facility];  Service: Endoscopy;  Laterality: N/A;   GALLBLADDER SURGERY     MALONEY DILATION N/A 04/04/2014   Procedure: Venia Minks DILATION;  Surgeon: Daneil Dolin, MD;  Location: [facility];  Service: Endoscopy;  Laterality: N/A;  #56 maloney dilator   POLYPECTOMY N/A 04/04/2014   Procedure: POLYPECTOMY;  Surgeon: Daneil Dolin, MD;  Location: [facility];  Service: Endoscopy;  Laterality: N/A;    Prior to Admission medications   Medication Sig Start Date End Date Taking? Authorizing Provider  Albuterol Sulfate 108 (90 Base) MCG/ACT AEPB Inhale 2 puffs into the lungs as needed.   Yes [provider]  allopurinol (ZYLOPRIM) 300 MG tablet Take 300 mg by mouth daily.  01/28/14  Yes [provider]  amoxicillin (AMOXIL) 875 MG tablet Take 1  tablet by mouth 2 (two) times daily. 02/27/19  Yes [provider]  benazepril (LOTENSIN) 5 MG tablet Take 5 mg by mouth at bedtime.   Yes [provider]  carvedilol (COREG) 6.25 MG tablet Take 9.375 mg by mouth 2 (two) times daily.  02/12/14  Yes [provider]  Cholecalciferol (VITAMIN D) 50 MCG (2000 UT) CAPS Take by mouth daily.   Yes [provider]  dicyclomine (BENTYL) 10 MG  capsule Take 10 mg by mouth daily.   Yes [provider]  Thomson 125 MCG tablet Take 0.125 mg by mouth daily.  02/11/14  Yes [provider]  DULoxetine (CYMBALTA) 30 MG capsule Take 30 mg by mouth daily.   Yes [provider]  fluticasone (FLONASE) 50 MCG/ACT nasal spray Place 2 sprays into both nostrils daily.   Yes [provider]  furosemide (LASIX) 20 MG tablet Take 10 mg by mouth daily.  02/12/14  Yes [provider]  gemfibrozil (LOPID) 600 MG tablet Take 600 mg by mouth daily.  02/04/14  Yes [provider]  HYDROcodone-acetaminophen (NORCO) 10-325 MG per tablet Take 1 tablet by mouth every 6 (six) hours as needed for moderate pain.  02/18/14  Yes [provider]  hydrocortisone (ANUSOL-HC) 2.5 % rectal cream Place 1 application rectally as needed for hemorrhoids or anal itching.   Yes [provider]  loratadine (CLARITIN) 10 MG tablet Take 10 mg by mouth daily.   Yes [provider]  Multiple Vitamin (MULTIVITAMIN) tablet Take 1 tablet by mouth daily.   Yes [provider]  naloxone (NARCAN) nasal spray 4 mg/0.1 mL Place 1 spray into the nose as needed.   Yes [provider]  nystatin cream (MYCOSTATIN) Apply 1 application topically 2 (two) times daily.   Yes [provider]  ondansetron (ZOFRAN-ODT) 4 MG disintegrating tablet Take 4 mg by mouth every 8 (eight) hours as needed for nausea or vomiting.  02/05/14  Yes [provider]  pantoprazole (PROTONIX) 40 MG tablet Take 40 mg by mouth daily.  01/28/14  Yes [provider]  Potassium 99 MG TABS Take by mouth daily.   Yes [provider]  promethazine-dextromethorphan (PROMETHAZINE-DM) 6.25-15 MG/5ML syrup Take 5 mLs by mouth as needed. 02/27/19  Yes [provider]  warfarin (COUMADIN) 5 MG tablet Take 5 mg by mouth daily at 6 PM.  12/17/13  Yes [provider]    Allergies as of 03/08/2019 -  Review Complete 03/08/2019  Allergen Reaction Noted   Other  03/08/2019   Sulfonamide derivatives      No family history on file.  Social History   Socioeconomic History   Marital status: Married    Spouse name: Not on file   Number of children: Not on file   Years of education: Not on file   Highest education level: Not on file  Occupational History   Not on file  Social Needs   Financial resource strain: Not on file   Food insecurity    Worry: Not on file    Inability: Not on file   Transportation needs    Medical: Not on file    Non-medical: Not on file  Tobacco Use   Smoking status: Never Smoker   Smokeless tobacco: Never Used  Substance and Sexual Activity   Alcohol use: No   Drug use: No   Sexual activity: Not on file  Lifestyle   Physical activity    Days per week:  Not on file    Minutes per session: Not on file   Stress: Not on file  Relationships   Social connections    Talks on phone: Not on file    Gets together: Not on file    Attends religious service: Not on file    Active member of club or organization: Not on file    Attends meetings of clubs or organizations: Not on file    Relationship status: Not on file   Intimate partner violence    Fear of current or ex partner: Not on file    Emotionally abused: Not on file    Physically abused: Not on file    Forced sexual activity: Not on file  Other Topics Concern   Not on file  Social History Narrative   Not on file    Review of Systems: See HPI, otherwise negative ROS  Physical Exam: BP (!) 144/77    Pulse 64    Temp (!) 96.8 F (36 C) (Temporal)    Ht 5\' 8"  (1.727 m)    Wt 172 lb 9.6 oz (78.3 kg)    BMI 26.24 kg/m  General:   Alert,  Well-developed, well-nourished, pleasant and cooperative in NAD Mouth:  No deformity or lesions. Neck:  Supple; no masses or thyromegaly. No significant cervical adenopathy. Lungs:  Clear throughout to auscultation.   No wheezes,  crackles, or rhonchi. No acute distress. Heart: Regular rate and rhythm with prosthetic heart sounds left upper sternal border.   Abdomen: Non-distended, normal bowel sounds.  Soft and nontender without appreciable mass or hepatosplenomegaly.  Pulses:  Normal pulses noted. Extremities:  Without clubbing or edema.  Impression/Plan: 68 year old gentleman with a history of colonic adenomas and a positive family history of colon cancer in first-degree relative at a young age.  He has paper hematochezia in the setting of chronic constipation and straining in the setting of chronic anticoagulation. Symptoms of rectal bleeding likely due to benign anorectal source in the setting of constipation.  He is due for surveillance colonoscopy at this time. History of small CVAs previously for which he was recommend he not interrupt his Coumadin therapy. Had a lengthy discussion about the pros and cons of perhaps 1 more surveillance colonoscopy.  Given findings of prior examination and the premium need for chronic anticoagulation, I would offer him 1 more surveillance colonoscopy utilizing propofol while remaining on Coumadin.  Small polyps can be biopsy/removed with no increased risk of significant bleeding. The risks, benefits, limitations, alternatives and imponderables have been reviewed with the patient. Questions have been answered. All parties are agreeable.    Recommendations:  Schedule a surveillance colonoscopy - hx of polyps - propofol  Patient to remain on coumadin for the procedure as discussed  For constipation, continue ducolax tablets   Add miralax 1 capful in 8oz of water nightly as needed.  Further recommendations to follow      Notice: This dictation was prepared with Dragon dictation along with smaller phrase technology. Any transcriptional errors that result from this process are unintentional and may not be corrected upon review.

## 2019-03-08 NOTE — Patient Instructions (Signed)
Schedule a surveillance colonoscopy - hx of polyps - propofol  Will allow you to remain on coumadin for the procedure as discussed  For constipation, continue ducolax tablets   Add miralax 1 capful in 8oz of water nightly as needed.  Further recommendations to follow

## 2019-05-07 ENCOUNTER — Telehealth: Payer: Self-pay | Admitting: Internal Medicine

## 2019-05-07 NOTE — Telephone Encounter (Signed)
LMOVM for pt 

## 2019-05-07 NOTE — Telephone Encounter (Signed)
Patient called back. He is requesting to reschedule until after new year. He is scheduled for 2/18 at 8:30am. Aware will mail new instructions/covid/pre-op. Endo aware.

## 2019-05-07 NOTE — Telephone Encounter (Signed)
[phone] PLEASE CALL PATIENT, HE WANTS TO CANCEL HIS PROCEDURE

## 2019-05-17 ENCOUNTER — Other Ambulatory Visit (HOSPITAL_COMMUNITY): Payer: Medicare Other

## 2019-08-07 ENCOUNTER — Telehealth: Payer: Self-pay | Admitting: *Deleted

## 2019-08-07 NOTE — Telephone Encounter (Signed)
Pt called in and requested to cancel his procedure.  Offered to reschedule him but he declined.  He said that he may call us back in March to reschedule for the future.  Kim in Day Surgery notified.

## 2019-08-14 ENCOUNTER — Encounter (HOSPITAL_COMMUNITY): Payer: Medicare Other

## 2019-08-14 ENCOUNTER — Other Ambulatory Visit (HOSPITAL_COMMUNITY): Payer: Medicare Other

## 2019-08-16 ENCOUNTER — Ambulatory Visit (HOSPITAL_COMMUNITY): Admission: RE | Admit: 2019-08-16 | Payer: Medicare Other | Source: Home / Self Care | Admitting: Internal Medicine

## 2019-08-16 ENCOUNTER — Encounter (HOSPITAL_COMMUNITY): Admission: RE | Payer: Self-pay | Source: Home / Self Care

## 2019-08-16 SURGERY — COLONOSCOPY WITH PROPOFOL
Anesthesia: Monitor Anesthesia Care

## 2023-01-18 ENCOUNTER — Emergency Department (HOSPITAL_COMMUNITY): Payer: Medicare Other

## 2023-01-18 ENCOUNTER — Other Ambulatory Visit: Payer: Self-pay

## 2023-01-18 ENCOUNTER — Inpatient Hospital Stay (HOSPITAL_COMMUNITY)
Admission: EM | Admit: 2023-01-18 | Discharge: 2023-01-25 | DRG: 377 | Disposition: A | Discharge status: Home Health | Payer: Medicare Other | Attending: Internal Medicine | Admitting: Internal Medicine

## 2023-01-18 ENCOUNTER — Encounter (HOSPITAL_COMMUNITY): Payer: Self-pay | Admitting: Emergency Medicine

## 2023-01-18 DIAGNOSIS — Z888 Allergy status to other drugs, medicaments and biological substances status: Secondary | ICD-10-CM

## 2023-01-18 DIAGNOSIS — I340 Nonrheumatic mitral (valve) insufficiency: Secondary | ICD-10-CM | POA: Diagnosis present

## 2023-01-18 DIAGNOSIS — D649 Anemia, unspecified: Secondary | ICD-10-CM

## 2023-01-18 DIAGNOSIS — Z800 Family history of malignant neoplasm of digestive organs: Secondary | ICD-10-CM

## 2023-01-18 DIAGNOSIS — Z9861 Coronary angioplasty status: Secondary | ICD-10-CM

## 2023-01-18 DIAGNOSIS — K219 Gastro-esophageal reflux disease without esophagitis: Secondary | ICD-10-CM | POA: Diagnosis present

## 2023-01-18 DIAGNOSIS — K5730 Diverticulosis of large intestine without perforation or abscess without bleeding: Secondary | ICD-10-CM | POA: Diagnosis present

## 2023-01-18 DIAGNOSIS — K5900 Constipation, unspecified: Secondary | ICD-10-CM | POA: Diagnosis present

## 2023-01-18 DIAGNOSIS — D62 Acute posthemorrhagic anemia: Secondary | ICD-10-CM | POA: Diagnosis present

## 2023-01-18 DIAGNOSIS — G3184 Mild cognitive impairment, so stated: Secondary | ICD-10-CM | POA: Diagnosis present

## 2023-01-18 DIAGNOSIS — M109 Gout, unspecified: Secondary | ICD-10-CM | POA: Diagnosis present

## 2023-01-18 DIAGNOSIS — I4821 Permanent atrial fibrillation: Secondary | ICD-10-CM | POA: Diagnosis present

## 2023-01-18 DIAGNOSIS — K922 Gastrointestinal hemorrhage, unspecified: Principal | ICD-10-CM | POA: Diagnosis present

## 2023-01-18 DIAGNOSIS — G8929 Other chronic pain: Secondary | ICD-10-CM | POA: Diagnosis present

## 2023-01-18 DIAGNOSIS — D123 Benign neoplasm of transverse colon: Secondary | ICD-10-CM | POA: Diagnosis present

## 2023-01-18 DIAGNOSIS — I482 Chronic atrial fibrillation, unspecified: Secondary | ICD-10-CM | POA: Diagnosis present

## 2023-01-18 DIAGNOSIS — K7460 Unspecified cirrhosis of liver: Secondary | ICD-10-CM | POA: Diagnosis present

## 2023-01-18 DIAGNOSIS — K317 Polyp of stomach and duodenum: Secondary | ICD-10-CM | POA: Diagnosis present

## 2023-01-18 DIAGNOSIS — D122 Benign neoplasm of ascending colon: Secondary | ICD-10-CM | POA: Diagnosis present

## 2023-01-18 DIAGNOSIS — M25511 Pain in right shoulder: Secondary | ICD-10-CM | POA: Diagnosis present

## 2023-01-18 DIAGNOSIS — E785 Hyperlipidemia, unspecified: Secondary | ICD-10-CM | POA: Diagnosis present

## 2023-01-18 DIAGNOSIS — Z882 Allergy status to sulfonamides status: Secondary | ICD-10-CM

## 2023-01-18 DIAGNOSIS — Z952 Presence of prosthetic heart valve: Secondary | ICD-10-CM

## 2023-01-18 DIAGNOSIS — K859 Acute pancreatitis without necrosis or infection, unspecified: Secondary | ICD-10-CM | POA: Diagnosis present

## 2023-01-18 DIAGNOSIS — Z9889 Other specified postprocedural states: Secondary | ICD-10-CM

## 2023-01-18 DIAGNOSIS — Z79899 Other long term (current) drug therapy: Secondary | ICD-10-CM

## 2023-01-18 DIAGNOSIS — S0990XA Unspecified injury of head, initial encounter: Secondary | ICD-10-CM

## 2023-01-18 DIAGNOSIS — K920 Hematemesis: Secondary | ICD-10-CM | POA: Diagnosis not present

## 2023-01-18 DIAGNOSIS — I10 Essential (primary) hypertension: Secondary | ICD-10-CM | POA: Diagnosis present

## 2023-01-18 DIAGNOSIS — Z7901 Long term (current) use of anticoagulants: Secondary | ICD-10-CM

## 2023-01-18 HISTORY — DX: Sleep apnea, unspecified: G47.30

## 2023-01-18 LAB — COMPREHENSIVE METABOLIC PANEL
ALT: 13 U/L (ref 0–44)
AST: 22 U/L (ref 15–41)
Albumin: 3.4 g/dL — ABNORMAL LOW (ref 3.5–5.0)
Alkaline Phosphatase: 50 U/L (ref 38–126)
Anion gap: 7 (ref 5–15)
BUN: 29 mg/dL — ABNORMAL HIGH (ref 8–23)
CO2: 26 mmol/L (ref 22–32)
Calcium: 8.4 mg/dL — ABNORMAL LOW (ref 8.9–10.3)
Chloride: 101 mmol/L (ref 98–111)
Creatinine, Ser: 1.11 mg/dL (ref 0.61–1.24)
GFR, Estimated: >60 mL/min (ref >60)
Glucose, Bld: 108 mg/dL — ABNORMAL HIGH (ref 70–99)
Potassium: 4 mmol/L (ref 3.5–5.1)
Sodium: 134 mmol/L — ABNORMAL LOW (ref 135–145)
Total Bilirubin: 0.2 mg/dL — ABNORMAL LOW (ref 0.3–1.2)
Total Protein: 5.9 g/dL — ABNORMAL LOW (ref 6.5–8.1)

## 2023-01-18 LAB — TYPE AND SCREEN
Antibody Screen: NEGATIVE
Unit division: 0

## 2023-01-18 LAB — I-STAT CHEM 8, ED
BUN: 30 mg/dL — ABNORMAL HIGH (ref 8–23)
Calcium, Ion: 1.07 mmol/L — ABNORMAL LOW (ref 1.15–1.40)
Chloride: 101 mmol/L (ref 98–111)
Creatinine, Ser: 1.2 mg/dL (ref 0.61–1.24)
Glucose, Bld: 104 mg/dL — ABNORMAL HIGH (ref 70–99)
HCT: 16 % — ABNORMAL LOW (ref 39.0–52.0)
Hemoglobin: 5.4 g/dL — CL (ref 13.0–17.0)
Potassium: 4.1 mmol/L (ref 3.5–5.1)
Sodium: 136 mmol/L (ref 135–145)
TCO2: 24 mmol/L (ref 22–32)

## 2023-01-18 LAB — CBC WITH DIFFERENTIAL/PLATELET
Abs Immature Granulocytes: 0 10*3/uL (ref 0.00–0.07)
Basophils Absolute: 0 10*3/uL (ref 0.0–0.1)
Basophils Relative: 0 %
Eosinophils Absolute: 0 10*3/uL (ref 0.0–0.5)
Eosinophils Relative: 0 %
HCT: 15.5 % — ABNORMAL LOW (ref 39.0–52.0)
Hemoglobin: 4.9 g/dL — CL (ref 13.0–17.0)
Immature Granulocytes: 0 %
Lymphocytes Relative: 26 %
Lymphs Abs: 1.9 10*3/uL (ref 0.7–4.0)
MCH: 27.4 pg (ref 26.0–34.0)
MCHC: 31.6 g/dL (ref 30.0–36.0)
MCV: 86.6 fL (ref 80.0–100.0)
Monocytes Absolute: 0.6 10*3/uL (ref 0.1–1.0)
Monocytes Relative: 8 %
Neutro Abs: 4.7 10*3/uL (ref 1.7–7.7)
Neutrophils Relative %: 66 %
Platelets: 198 10*3/uL (ref 150–400)
RBC: 1.79 MIL/uL — ABNORMAL LOW (ref 4.22–5.81)
RDW: 16.1 % — ABNORMAL HIGH (ref 11.5–15.5)
WBC: 7.2 10*3/uL (ref 4.0–10.5)
nRBC: 0 % (ref 0.0–0.2)
nRBC: 0 /100 WBC

## 2023-01-18 LAB — URINALYSIS, ROUTINE W REFLEX MICROSCOPIC
Bilirubin Urine: NEGATIVE
Glucose, UA: NEGATIVE mg/dL
Hgb urine dipstick: NEGATIVE
Ketones, ur: NEGATIVE mg/dL
Leukocytes,Ua: NEGATIVE
Nitrite: NEGATIVE
Protein, ur: NEGATIVE mg/dL
Specific Gravity, Urine: 1.015 (ref 1.005–1.030)
pH: 5 (ref 5.0–8.0)

## 2023-01-18 LAB — LIPASE, BLOOD: Lipase: 54 U/L — ABNORMAL HIGH (ref 11–51)

## 2023-01-18 LAB — PROTIME-INR
INR: 2.7 — ABNORMAL HIGH (ref 0.8–1.2)
Prothrombin Time: 28.6 seconds — ABNORMAL HIGH (ref 11.4–15.2)

## 2023-01-18 LAB — BPAM RBC
Blood Product Expiration Date: 202408252359
ISSUE DATE / TIME: 202407232102
Unit Type and Rh: 5100

## 2023-01-18 LAB — POC OCCULT BLOOD, ED: Fecal Occult Bld: POSITIVE — AB

## 2023-01-18 LAB — PREPARE RBC (CROSSMATCH)

## 2023-01-18 LAB — APTT: aPTT: 40 seconds — ABNORMAL HIGH (ref 24–36)

## 2023-01-18 MED ORDER — SODIUM CHLORIDE 0.9 % IV SOLN
1.0000 g | Freq: Once | INTRAVENOUS | Status: AC
  Administered 2023-01-18: 1 g via INTRAVENOUS
  Filled 2023-01-18: qty 10

## 2023-01-18 MED ORDER — CALCIUM GLUCONATE-NACL 1-0.675 GM/50ML-% IV SOLN
1.0000 g | Freq: Once | INTRAVENOUS | Status: AC
  Administered 2023-01-18: 1000 mg via INTRAVENOUS
  Filled 2023-01-18: qty 50

## 2023-01-18 MED ORDER — PANTOPRAZOLE 80MG IVPB - SIMPLE MED
80.0000 mg | Freq: Once | INTRAVENOUS | Status: AC
  Administered 2023-01-18: 80 mg via INTRAVENOUS
  Filled 2023-01-18: qty 100

## 2023-01-18 MED ORDER — IOHEXOL 350 MG/ML SOLN
100.0000 mL | Freq: Once | INTRAVENOUS | Status: AC | PRN
  Administered 2023-01-18: 100 mL via INTRAVENOUS

## 2023-01-18 MED ORDER — SODIUM CHLORIDE 0.9% IV SOLUTION: Freq: Once | INTRAVENOUS | Status: AC

## 2023-01-18 MED ORDER — PANTOPRAZOLE INFUSION (NEW) - SIMPLE MED
8.0000 mg/h | INTRAVENOUS | Status: AC
  Administered 2023-01-18 – 2023-01-21 (×6): 8 mg/h via INTRAVENOUS
  Filled 2023-01-18 (×8): qty 100

## 2023-01-18 MED ORDER — HYDROCODONE-ACETAMINOPHEN 5-325 MG PO TABS
1.0000 | ORAL_TABLET | Freq: Once | ORAL | Status: AC
  Administered 2023-01-18: 1 via ORAL
  Filled 2023-01-18: qty 1

## 2023-01-18 NOTE — ED Triage Notes (Signed)
Per [facility] pt coming from home c/o nausea, vomiting and diarrhea x 5 days. Pt also reports intermittent bloody stools with generalized abdominal pain. Reports falling yesterday- having head and neck pain. On coumadin.

## 2023-01-18 NOTE — ED Notes (Signed)
Patient transported to CT 

## 2023-01-18 NOTE — ED Provider Triage Note (Signed)
Emergency Medicine Provider Triage Evaluation Note  Lucas Bowman , a 72 y.o. male  was evaluated in triage.  Pt complains of nausea, vomiting, diarrhea x 5 days. Notes right sided abdominal pain last night. Still has his appendix. Denies constipation, urinary symptoms, cough, chest pain, shortness of breath.   Also notes a fall on yesterday. And notes that he felt lightheaded prior to the fall. On coumadin.   Review of Systems  Positive:  Negative:   Physical Exam  BP 118/72   Pulse (!) 103   Temp 97.8 F (36.6 C)   Resp 18   Ht 5\' 8"  (1.727 m)   Wt 77.1 kg   SpO2 97%   BMI 25.85 kg/m  Gen:   Awake, no distress, pale in appearance.   Resp:  Normal effort  MSK:   Moves extremities without difficulty  Other:  Mild abdominal TTP noted to left sided abdomen  Medical Decision Making  Medically screening exam initiated at 6:34 PM.  Appropriate orders placed.  Zavian E Ladouceur was informed that the remainder of the evaluation will be completed by another provider, this initial triage assessment does not replace that evaluation, and the importance of remaining in the ED until their evaluation is complete.  6:34 PM - Discussed with RN that patient is in need of a room immediately. RN aware and working on room placement.    Eura Radabaugh A, PA-C 01/18/23 1859

## 2023-01-18 NOTE — ED Provider Notes (Signed)
[place] EMERGENCY DEPARTMENT AT [facility] Provider Note   CSN: [id number] Arrival date & time: 01/18/23  1827     History {Add pertinent medical, surgical, social history, OB history to HPI:1} Chief Complaint  Patient presents with   Emesis   Diarrhea    Alvia Tory Xu is a 72 y.o. male.  72 year old male with a history of atrial fibrillation and mechanical aortic valve replacement on Coumadin, ascending aortic aneurysm status post repair, diverticulosis, and colonic adenomas presents emergency department with maroon-colored stools, vomiting, and presyncope.  Over the past 3 days patient has been having approximately 5-6 maroon-colored stools per day.  Also started developing some left lower quadrant abdominal pain and nausea and vomiting with hematemesis.  Says that he felt very dizzy and almost passed out but did fall to the ground and hit his head.  Denies chest pain or shortness of breath.  Says he has been feeling very fatigued.       Home Medications Prior to Admission medications   Medication Sig Start Date End Date Taking? Authorizing Provider  albuterol (VENTOLIN HFA) 108 (90 Base) MCG/ACT inhaler SMARTSIG:2 Puff(s) By Mouth Every 6 Hours PRN 04/24/19   [provider]  Albuterol Sulfate 108 (90 Base) MCG/ACT AEPB Inhale 2 puffs into the lungs as needed.    [provider]  allopurinol (ZYLOPRIM) 300 MG tablet Take 300 mg by mouth daily.  01/28/14   [provider]  amoxicillin (AMOXIL) 875 MG tablet Take 1 tablet by mouth 2 (two) times daily. 02/27/19   [provider]  benazepril (LOTENSIN) 5 MG tablet Take 5 mg by mouth at bedtime.    [provider]  carvedilol (COREG) 6.25 MG tablet Take 9.375 mg by mouth 2 (two) times daily.  02/12/14   [provider]  Cholecalciferol (VITAMIN D) 50 MCG (2000 UT) CAPS Take by mouth daily.    [provider]  cyclobenzaprine (FLEXERIL) 10 MG tablet Take 10 mg  by mouth at bedtime. 04/10/19   [provider]  dicyclomine (BENTYL) 10 MG capsule Take 10 mg by mouth daily.    [provider]  DIGOX 125 MCG tablet Take 0.125 mg by mouth daily.  02/11/14   [provider]  DULoxetine (CYMBALTA) 30 MG capsule Take 30 mg by mouth daily.    [provider]  fluticasone (FLONASE) 50 MCG/ACT nasal spray Place 2 sprays into both nostrils daily.    [provider]  furosemide (LASIX) 20 MG tablet Take 10 mg by mouth daily.  02/12/14   [provider]  gemfibrozil (LOPID) 600 MG tablet Take 600 mg by mouth daily.  02/04/14   [provider]  HYDROcodone-acetaminophen (NORCO) 10-325 MG per tablet Take 1 tablet by mouth every 6 (six) hours as needed for moderate pain.  02/18/14   [provider]  hydrocortisone (ANUSOL-HC) 2.5 % rectal cream Place 1 application rectally as needed for hemorrhoids or anal itching.    [provider]  loratadine (CLARITIN) 10 MG tablet Take 10 mg by mouth daily.    [provider]  Multiple Vitamin (MULTIVITAMIN) tablet Take 1 tablet by mouth daily.    [provider]  naloxone [facility]) nasal spray 4 mg/0.1 mL Place 1 spray into the nose as needed.    [provider]  nystatin cream (MYCOSTATIN) Apply 1 application topically 2 (two) times daily.    [provider]  ondansetron (ZOFRAN-ODT) 4 MG disintegrating tablet Take 4 mg  by mouth every 8 (eight) hours as needed for nausea or vomiting.  02/05/14   [provider]  pantoprazole (PROTONIX) 40 MG tablet Take 40 mg by mouth daily.  01/28/14   [provider]  polyethylene glycol-electrolytes (TRILYTE) 420 g solution Take 4,000 mLs by mouth as directed. 03/08/19   Rourk, Gerrit Friends, MD  Potassium 99 MG TABS Take by mouth daily.    [provider]  PREVIDENT 5000 SENSITIVE 1.1-5 % PSTE USE TO BRUSH 3 TIMES DAILY 02/24/19   [provider]   promethazine-dextromethorphan (PROMETHAZINE-DM) 6.25-15 MG/5ML syrup Take 5 mLs by mouth as needed. 02/27/19   [provider]  traZODone (DESYREL) 100 MG tablet Take 100 mg by mouth at bedtime. 04/21/19   [provider]  warfarin (COUMADIN) 5 MG tablet Take 5 mg by mouth daily at 6 PM.  12/17/13   [provider]      Allergies    Other and Sulfonamide derivatives    Review of Systems   Review of Systems  Physical Exam Updated Vital Signs BP 118/72   Pulse (!) 103   Temp 97.8 F (36.6 C)   Resp 18   Ht 5\' 8"  (1.727 m)   Wt 77.1 kg   SpO2 97%   BMI 25.85 kg/m  Physical Exam Vitals and nursing note reviewed.  Constitutional:      General: He is not in acute distress.    Appearance: He is well-developed.  HENT:     Head: Normocephalic and atraumatic.     Right Ear: External ear normal.     Left Ear: External ear normal.     Nose: Nose normal.  Eyes:     Extraocular Movements: Extraocular movements intact.     Conjunctiva/sclera: Conjunctivae normal.     Pupils: Pupils are equal, round, and reactive to light.  Cardiovascular:     Rate and Rhythm: Normal rate and regular rhythm.     Heart sounds: Normal heart sounds.  Pulmonary:     Effort: Pulmonary effort is normal. No respiratory distress.  Abdominal:     General: There is no distension.     Palpations: Abdomen is soft. There is no mass.     Tenderness: There is abdominal tenderness (LLQ). There is no guarding.  Genitourinary:    Comments: Chaperoned by patient's RN. No external hemorrhoids or fissures noted on external inspection. No internal masses or hemorroids noted. Normal rectal tone. Maroon colored hemoccult + stool in rectal vault  Musculoskeletal:     Cervical back: Normal range of motion and neck supple.     Right lower leg: No edema.     Left lower leg: No edema.  Skin:    General: Skin is warm and dry.  Neurological:     Mental Status: He is alert. Mental status is at  baseline.  Psychiatric:        Mood and Affect: Mood normal.        Behavior: Behavior normal.     ED Results / Procedures / Treatments   Labs (all labs ordered are listed, but only abnormal results are displayed) Labs Reviewed  CBC WITH DIFFERENTIAL/PLATELET - Abnormal; Notable for the following components:      Result Value   RBC 1.79 (*)    Hemoglobin 4.9 (*)    HCT 15.5 (*)    RDW 16.1 (*)    All other components within normal limits  COMPREHENSIVE METABOLIC PANEL - Abnormal; Notable for the following components:  Sodium 134 (*)    Glucose, Bld 108 (*)    BUN 29 (*)    Calcium 8.4 (*)    Total Protein 5.9 (*)    Albumin 3.4 (*)    Total Bilirubin 0.2 (*)    All other components within normal limits  LIPASE, BLOOD - Abnormal; Notable for the following components:   Lipase 54 (*)    All other components within normal limits  APTT - Abnormal; Notable for the following components:   aPTT 40 (*)    All other components within normal limits  I-STAT CHEM 8, ED - Abnormal; Notable for the following components:   BUN 30 (*)    Glucose, Bld 104 (*)    Calcium, Ion 1.07 (*)    Hemoglobin 5.4 (*)    HCT 16.0 (*)    All other components within normal limits  URINALYSIS, ROUTINE W REFLEX MICROSCOPIC  PROTIME-INR  POC OCCULT BLOOD, ED  TYPE AND SCREEN  PREPARE RBC (CROSSMATCH)    EKG None  Radiology CT Head Wo Contrast  Result Date: 01/18/2023 CLINICAL DATA:  Head and neck trauma EXAM: CT HEAD WITHOUT CONTRAST CT CERVICAL SPINE WITHOUT CONTRAST TECHNIQUE: Multidetector CT imaging of the head and cervical spine was performed following the standard protocol without intravenous contrast. Multiplanar CT image reconstructions of the cervical spine were also generated. RADIATION DOSE REDUCTION: This exam was performed according to the departmental dose-optimization program which includes automated exposure control, adjustment of the mA and/or kV according to patient size and/or  use of iterative reconstruction technique. COMPARISON:  None Available. FINDINGS: CT HEAD FINDINGS Brain: No acute territorial infarction, hemorrhage or intracranial mass. Moderate white matter hypodensity consistent with chronic small vessel ischemic change. Nonenlarged ventricles Vascular: No hyperdense vessels. Vertebral and carotid vascular calcification Skull: Normal. Negative for fracture or focal lesion. Sinuses/Orbits: Opacified left maxillary sinus with scattered calcifications Other: None CT CERVICAL SPINE FINDINGS Alignment: Reversal of cervical lordosis. 3 mm anterolisthesis C3 on C4 and C4 on C5. This is likely due to advanced posterior facet degenerative changes. Facet alignment within normal limits. Skull base and vertebrae: No acute fracture. No primary bone lesion or focal pathologic process. Soft tissues and spinal canal: No prevertebral fluid or swelling. No visible canal hematoma. Disc levels: Multilevel degenerative changes. Partial ankylosis C5-C6. Moderate disc space narrowing C4-C5, advanced disc space narrowing C6-C7 and C7-T1. Hypertrophic facet degenerative changes at multiple levels with foraminal stenosis. Upper chest: Negative. Other: None IMPRESSION: 1. No CT evidence for acute intracranial abnormality. 2. Moderate chronic small vessel ischemic changes of the white matter. 3. Reversal of cervical lordosis and multilevel degenerative changes. No acute fracture Electronically Signed   By: Jasmine Pang M.D.   On: 01/18/2023 19:45   CT Cervical Spine Wo Contrast  Result Date: 01/18/2023 CLINICAL DATA:  Head and neck trauma EXAM: CT HEAD WITHOUT CONTRAST CT CERVICAL SPINE WITHOUT CONTRAST TECHNIQUE: Multidetector CT imaging of the head and cervical spine was performed following the standard protocol without intravenous contrast. Multiplanar CT image reconstructions of the cervical spine were also generated. RADIATION DOSE REDUCTION: This exam was performed according to the departmental  dose-optimization program which includes automated exposure control, adjustment of the mA and/or kV according to patient size and/or use of iterative reconstruction technique. COMPARISON:  None Available. FINDINGS: CT HEAD FINDINGS Brain: No acute territorial infarction, hemorrhage or intracranial mass. Moderate white matter hypodensity consistent with chronic small vessel ischemic change. Nonenlarged ventricles Vascular: No hyperdense vessels. Vertebral and carotid vascular  calcification Skull: Normal. Negative for fracture or focal lesion. Sinuses/Orbits: Opacified left maxillary sinus with scattered calcifications Other: None CT CERVICAL SPINE FINDINGS Alignment: Reversal of cervical lordosis. 3 mm anterolisthesis C3 on C4 and C4 on C5. This is likely due to advanced posterior facet degenerative changes. Facet alignment within normal limits. Skull base and vertebrae: No acute fracture. No primary bone lesion or focal pathologic process. Soft tissues and spinal canal: No prevertebral fluid or swelling. No visible canal hematoma. Disc levels: Multilevel degenerative changes. Partial ankylosis C5-C6. Moderate disc space narrowing C4-C5, advanced disc space narrowing C6-C7 and C7-T1. Hypertrophic facet degenerative changes at multiple levels with foraminal stenosis. Upper chest: Negative. Other: None IMPRESSION: 1. No CT evidence for acute intracranial abnormality. 2. Moderate chronic small vessel ischemic changes of the white matter. 3. Reversal of cervical lordosis and multilevel degenerative changes. No acute fracture Electronically Signed   By: Jasmine Pang M.D.   On: 01/18/2023 19:45    Procedures Procedures  {Document cardiac monitor, telemetry assessment procedure when appropriate:1}  Medications Ordered in ED Medications  pantoprazole (PROTONIX) 80 mg /NS 100 mL IVPB (has no administration in time range)  pantoprozole (PROTONIX) 80 mg /NS 100 mL infusion (has no administration in time range)  0.9 %   sodium chloride infusion (Manually program via Guardrails IV Fluids) (has no administration in time range)    ED Course/ Medical Decision Making/ A&P   {   Click here for ABCD2, HEART and other calculatorsREFRESH Note before signing :1}                          Medical Decision Making Amount and/or Complexity of Data Reviewed Labs: ordered. Radiology: ordered.  Risk Prescription drug management. Decision regarding hospitalization.   ***  {Document critical care time when appropriate:1} {Document review of labs and clinical decision tools ie heart score, Chads2Vasc2 etc:1}  {Document your independent review of radiology images, and any outside records:1} {Document your discussion with family members, caretakers, and with consultants:1} {Document social determinants of health affecting pt's care:1} {Document your decision making why or why not admission, treatments were needed:1} Final Clinical Impression(s) / ED Diagnoses Final diagnoses:  None    Rx / DC Orders ED Discharge Orders     None

## 2023-01-19 DIAGNOSIS — Z79899 Other long term (current) drug therapy: Secondary | ICD-10-CM | POA: Diagnosis not present

## 2023-01-19 DIAGNOSIS — K7460 Unspecified cirrhosis of liver: Secondary | ICD-10-CM

## 2023-01-19 DIAGNOSIS — K921 Melena: Secondary | ICD-10-CM

## 2023-01-19 DIAGNOSIS — Z9889 Other specified postprocedural states: Secondary | ICD-10-CM | POA: Diagnosis not present

## 2023-01-19 DIAGNOSIS — D123 Benign neoplasm of transverse colon: Secondary | ICD-10-CM | POA: Diagnosis present

## 2023-01-19 DIAGNOSIS — I482 Chronic atrial fibrillation, unspecified: Secondary | ICD-10-CM | POA: Diagnosis not present

## 2023-01-19 DIAGNOSIS — Z952 Presence of prosthetic heart valve: Secondary | ICD-10-CM | POA: Diagnosis not present

## 2023-01-19 DIAGNOSIS — Z7901 Long term (current) use of anticoagulants: Secondary | ICD-10-CM | POA: Diagnosis not present

## 2023-01-19 DIAGNOSIS — K922 Gastrointestinal hemorrhage, unspecified: Secondary | ICD-10-CM | POA: Diagnosis not present

## 2023-01-19 DIAGNOSIS — I4819 Other persistent atrial fibrillation: Secondary | ICD-10-CM

## 2023-01-19 DIAGNOSIS — M25511 Pain in right shoulder: Secondary | ICD-10-CM | POA: Diagnosis present

## 2023-01-19 DIAGNOSIS — K5900 Constipation, unspecified: Secondary | ICD-10-CM | POA: Diagnosis present

## 2023-01-19 DIAGNOSIS — I350 Nonrheumatic aortic (valve) stenosis: Secondary | ICD-10-CM | POA: Diagnosis not present

## 2023-01-19 DIAGNOSIS — I4821 Permanent atrial fibrillation: Secondary | ICD-10-CM | POA: Diagnosis present

## 2023-01-19 DIAGNOSIS — D649 Anemia, unspecified: Secondary | ICD-10-CM | POA: Diagnosis not present

## 2023-01-19 DIAGNOSIS — K920 Hematemesis: Secondary | ICD-10-CM | POA: Diagnosis present

## 2023-01-19 DIAGNOSIS — K5730 Diverticulosis of large intestine without perforation or abscess without bleeding: Secondary | ICD-10-CM | POA: Diagnosis present

## 2023-01-19 DIAGNOSIS — K859 Acute pancreatitis without necrosis or infection, unspecified: Secondary | ICD-10-CM

## 2023-01-19 DIAGNOSIS — D122 Benign neoplasm of ascending colon: Secondary | ICD-10-CM | POA: Diagnosis present

## 2023-01-19 DIAGNOSIS — Z5181 Encounter for therapeutic drug level monitoring: Secondary | ICD-10-CM | POA: Diagnosis not present

## 2023-01-19 DIAGNOSIS — D62 Acute posthemorrhagic anemia: Secondary | ICD-10-CM

## 2023-01-19 DIAGNOSIS — Z888 Allergy status to other drugs, medicaments and biological substances status: Secondary | ICD-10-CM | POA: Diagnosis not present

## 2023-01-19 DIAGNOSIS — G8929 Other chronic pain: Secondary | ICD-10-CM | POA: Diagnosis present

## 2023-01-19 DIAGNOSIS — G3184 Mild cognitive impairment, so stated: Secondary | ICD-10-CM

## 2023-01-19 DIAGNOSIS — Z882 Allergy status to sulfonamides status: Secondary | ICD-10-CM | POA: Diagnosis not present

## 2023-01-19 DIAGNOSIS — I10 Essential (primary) hypertension: Secondary | ICD-10-CM

## 2023-01-19 DIAGNOSIS — Z800 Family history of malignant neoplasm of digestive organs: Secondary | ICD-10-CM | POA: Diagnosis not present

## 2023-01-19 DIAGNOSIS — M109 Gout, unspecified: Secondary | ICD-10-CM | POA: Diagnosis present

## 2023-01-19 DIAGNOSIS — E785 Hyperlipidemia, unspecified: Secondary | ICD-10-CM | POA: Diagnosis present

## 2023-01-19 DIAGNOSIS — K219 Gastro-esophageal reflux disease without esophagitis: Secondary | ICD-10-CM | POA: Diagnosis present

## 2023-01-19 DIAGNOSIS — I340 Nonrheumatic mitral (valve) insufficiency: Secondary | ICD-10-CM | POA: Diagnosis present

## 2023-01-19 DIAGNOSIS — D509 Iron deficiency anemia, unspecified: Secondary | ICD-10-CM | POA: Diagnosis not present

## 2023-01-19 DIAGNOSIS — K317 Polyp of stomach and duodenum: Secondary | ICD-10-CM | POA: Diagnosis present

## 2023-01-19 LAB — COMPREHENSIVE METABOLIC PANEL
ALT: 14 U/L (ref 0–44)
AST: 22 U/L (ref 15–41)
Albumin: 3.3 g/dL — ABNORMAL LOW (ref 3.5–5.0)
Alkaline Phosphatase: 46 U/L (ref 38–126)
Anion gap: 14 (ref 5–15)
BUN: 23 mg/dL (ref 8–23)
CO2: 25 mmol/L (ref 22–32)
Calcium: 8.7 mg/dL — ABNORMAL LOW (ref 8.9–10.3)
Chloride: 99 mmol/L (ref 98–111)
Creatinine, Ser: 1.09 mg/dL (ref 0.61–1.24)
GFR, Estimated: >60 mL/min (ref >60)
Glucose, Bld: 108 mg/dL — ABNORMAL HIGH (ref 70–99)
Potassium: 3.7 mmol/L (ref 3.5–5.1)
Sodium: 138 mmol/L (ref 135–145)
Total Bilirubin: 0.9 mg/dL (ref 0.3–1.2)
Total Protein: 5.6 g/dL — ABNORMAL LOW (ref 6.5–8.1)

## 2023-01-19 LAB — HEMOGLOBIN AND HEMATOCRIT, BLOOD
HCT: 19.8 % — ABNORMAL LOW (ref 39.0–52.0)
HCT: 23.7 % — ABNORMAL LOW (ref 39.0–52.0)
HCT: 27.3 % — ABNORMAL LOW (ref 39.0–52.0)
Hemoglobin: 6.5 g/dL — CL (ref 13.0–17.0)
Hemoglobin: 7.7 g/dL — ABNORMAL LOW (ref 13.0–17.0)
Hemoglobin: 8.9 g/dL — ABNORMAL LOW (ref 13.0–17.0)

## 2023-01-19 LAB — AMMONIA: Ammonia: 17 umol/L (ref 9–35)

## 2023-01-19 LAB — TYPE AND SCREEN: Unit division: 0

## 2023-01-19 LAB — PROTIME-INR
INR: 2 — ABNORMAL HIGH (ref 0.8–1.2)
Prothrombin Time: 23.3 seconds — ABNORMAL HIGH (ref 11.4–15.2)

## 2023-01-19 LAB — LIPASE, BLOOD: Lipase: 44 U/L (ref 11–51)

## 2023-01-19 LAB — BPAM RBC
ISSUE DATE / TIME: 202407232102
ISSUE DATE / TIME: 202407240014
ISSUE DATE / TIME: 202407240411
Unit Type and Rh: 5100

## 2023-01-19 MED ORDER — ONDANSETRON HCL 4 MG PO TABS
4.0000 mg | ORAL_TABLET | Freq: Four times a day (QID) | ORAL | Status: DC | PRN
  Administered 2023-01-24 – 2023-01-25 (×2): 4 mg via ORAL
  Filled 2023-01-19 (×2): qty 1

## 2023-01-19 MED ORDER — CYCLOBENZAPRINE HCL 5 MG PO TABS
10.0000 mg | ORAL_TABLET | Freq: Every evening | ORAL | Status: DC | PRN
  Administered 2023-01-19 – 2023-01-20 (×2): 10 mg via ORAL
  Filled 2023-01-19: qty 2
  Filled 2023-01-19: qty 1

## 2023-01-19 MED ORDER — DULOXETINE HCL 30 MG PO CPEP
30.0000 mg | ORAL_CAPSULE | Freq: Every day | ORAL | Status: DC
  Administered 2023-01-19 – 2023-01-24 (×6): 30 mg via ORAL
  Filled 2023-01-19 (×7): qty 1

## 2023-01-19 MED ORDER — ALBUTEROL SULFATE (2.5 MG/3ML) 0.083% IN NEBU: 3.0000 mL | INHALATION_SOLUTION | Freq: Two times a day (BID) | RESPIRATORY_TRACT | Status: DC | PRN

## 2023-01-19 MED ORDER — LORATADINE 10 MG PO TABS
10.0000 mg | ORAL_TABLET | Freq: Every day | ORAL | Status: DC
  Administered 2023-01-19 – 2023-01-24 (×6): 10 mg via ORAL
  Filled 2023-01-19 (×7): qty 1

## 2023-01-19 MED ORDER — HYDROCODONE-ACETAMINOPHEN 10-325 MG PO TABS
1.0000 | ORAL_TABLET | Freq: Four times a day (QID) | ORAL | Status: DC | PRN
  Administered 2023-01-19 – 2023-01-25 (×13): 1 via ORAL
  Filled 2023-01-19 (×14): qty 1

## 2023-01-19 MED ORDER — ALLOPURINOL 300 MG PO TABS
300.0000 mg | ORAL_TABLET | Freq: Every day | ORAL | Status: DC
  Administered 2023-01-19 – 2023-01-25 (×7): 300 mg via ORAL
  Filled 2023-01-19 (×6): qty 1
  Filled 2023-01-19: qty 3

## 2023-01-19 MED ORDER — TRAZODONE HCL 50 MG PO TABS
100.0000 mg | ORAL_TABLET | Freq: Every day | ORAL | Status: DC
  Administered 2023-01-19 – 2023-01-24 (×7): 100 mg via ORAL
  Filled 2023-01-19 (×7): qty 2

## 2023-01-19 MED ORDER — ACETAMINOPHEN 650 MG RE SUPP: 650.0000 mg | Freq: Four times a day (QID) | RECTAL | Status: DC | PRN

## 2023-01-19 MED ORDER — ADULT MULTIVITAMIN W/MINERALS CH
1.0000 | ORAL_TABLET | Freq: Every day | ORAL | Status: DC
  Administered 2023-01-20 – 2023-01-25 (×6): 1 via ORAL
  Filled 2023-01-19 (×7): qty 1

## 2023-01-19 MED ORDER — ONDANSETRON HCL 4 MG/2ML IJ SOLN
4.0000 mg | Freq: Four times a day (QID) | INTRAMUSCULAR | Status: DC | PRN
  Administered 2023-01-23 – 2023-01-24 (×2): 4 mg via INTRAVENOUS
  Filled 2023-01-19 (×2): qty 2

## 2023-01-19 MED ORDER — HYDROCODONE-ACETAMINOPHEN 5-325 MG PO TABS
1.0000 | ORAL_TABLET | Freq: Once | ORAL | Status: AC
  Administered 2023-01-19: 1 via ORAL
  Filled 2023-01-19: qty 1

## 2023-01-19 MED ORDER — ACETAMINOPHEN 325 MG PO TABS
650.0000 mg | ORAL_TABLET | Freq: Four times a day (QID) | ORAL | Status: DC | PRN
  Filled 2023-01-19: qty 2

## 2023-01-19 NOTE — ED Notes (Signed)
Pt reporting neck pain. States that this is a chronic problem, however last does of Norco was at noon

## 2023-01-19 NOTE — Consult Note (Incomplete)
Cardiology Consultation   Patient ID: Lucas Bowman MRN: [id number]; DOB: Nov 23, 1950  Admit date: 01/18/2023 Date of Consult: 01/19/2023  PCP:  Lucas Nations, MD   [facility] Providers Cardiologist:  None   { Click here to update MD or APP on Care Team, Refresh:1}     Patient Profile:   Lucas Bowman is a 72 y.o. male with  who is being seen 01/19/2023 for the evaluation of *** at the request of ***.  History of Present Illness:   Lucas Bowman ***   Past Medical History:  Diagnosis Date  . Anemia   . Depression   . Gastritis   . GERD (gastroesophageal reflux disease)   . Gout   . H/O aortic valve replacement   . Hemorrhoids   . Hiatal hernia   . Internal hemorrhoids   . Irritable bowel syndrome   . Mitral insufficiency   . Neck pain   . Septic shock (HCC) 03/06/2009  . Tubular adenoma     Past Surgical History:  Procedure Laterality Date  . BIOPSY N/A 04/04/2014   Procedure: GASTRIC BIOPSIES;  Surgeon: Corbin Ade, MD;  Location: [facility];  Service: Endoscopy;  Laterality: N/A;  . COLONOSCOPY  01/28/2009   RMR: Internal hemorrhoids, otherwise normal rectum/Sigmoid polyp, status post hot snare polypectomy.ADENOMATOUS POLYP   . COLONOSCOPY  02/17/2006   Dr.Rourk-Normal rectum.  Left colon polyps= tubular adenoma,Remainder of the colonic mucosa appeared normal.   . COLONOSCOPY WITH PROPOFOL N/A 04/04/2014   Procedure: COLONOSCOPY WITH PROPOFOL;  Surgeon: Corbin Ade, MD;  Location: [facility];  Service: Endoscopy;  Laterality: N/A;  1018 in cecum, total withdrawal time:  . CORONARY ANGIOPLASTY    . ESOPHAGOGASTRODUODENOSCOPY  01/28/2009   MV[facility]:QION distal esophageal erosions consistent with mild erosive reflux esophagitis/ Small hiatal hernia otherwise.  Upper GI tract appeared normal.  . ESOPHAGOGASTRODUODENOSCOPY  02/17/2006   Dr.Rourk-Normal esophagus, small hiatal hernia, otherwise normal stomach, D1 and D2  . ESOPHAGOGASTRODUODENOSCOPY (EGD)  WITH PROPOFOL N/A 04/04/2014   Procedure: ESOPHAGOGASTRODUODENOSCOPY (EGD) WITH PROPOFOL;  Surgeon: Corbin Ade, MD;  Location: [facility];  Service: Endoscopy;  Laterality: N/A;  . GALLBLADDER SURGERY    . MALONEY DILATION N/A 04/04/2014   Procedure: Elease Hashimoto DILATION;  Surgeon: Corbin Ade, MD;  Location: [facility];  Service: Endoscopy;  Laterality: N/A;  #56 maloney dilator  . POLYPECTOMY N/A 04/04/2014   Procedure: POLYPECTOMY;  Surgeon: Corbin Ade, MD;  Location: [facility];  Service: Endoscopy;  Laterality: N/A;     {Home Medications (Optional):21181}  Inpatient Medications: Scheduled Meds:  Continuous Infusions: . calcium gluconate 1,000 mg (01/18/23 2350)  . cefTRIAXone (ROCEPHIN)  IV 1 g (01/18/23 2337)  . pantoprazole 8 mg/hr (01/18/23 2101)   PRN Meds:   Allergies:    Allergies  Allergen Reactions  . Other     Pneumonia shot-redness, swelling  . Sulfonamide Derivatives Other (See Comments)    Social History:   Social History   Socioeconomic History  . Marital status: Married    Spouse name: Not on file  . Number of children: Not on file  . Years of education: Not on file  . Highest education level: Not on file  Occupational History  . Not on file  Tobacco Use  . Smoking status: Never  . Smokeless tobacco: Never  Substance and Sexual Activity  . Alcohol use: No  . Drug use: No  . Sexual activity: Not on file  Other Topics  Concern  . Not on file  Social History Narrative  . Not on file   Social Determinants of Health   Financial Resource Strain: Not on file  Food Insecurity: Not on file  Transportation Needs: Not on file  Physical Activity: Not on file  Stress: Not on file  Social Connections: Not on file  Intimate Partner Violence: Not on file    Family History:   ***History reviewed. No pertinent family history.   ROS:  Please see the history of present illness.  *** All other ROS reviewed and negative.     Physical Exam/Data:   Vitals:    01/18/23 2230 01/18/23 2300 01/18/23 2315 01/18/23 2345  BP: 112/72 (!) 125/58 119/67 122/69  Pulse: (!) 107 (!) 103 95 (!) 106  Resp: (!) 24 20 (!) 24 (!) 21  Temp: 98.7 F (37.1 C) 98.6 F (37 C)    TempSrc: Oral Oral    SpO2: 100% 100% 100% 100%  Weight:      Height:        Intake/Output Summary (Last 24 hours) at 01/19/2023 0002 Last data filed at 01/18/2023 2127 Gross per 24 hour  Intake 100 ml  Output --  Net 100 ml      01/18/2023    6:33 PM 03/08/2019    2:38 PM 03/28/2014   12:49 PM  Last 3 Weights  Weight (lbs) 170 lb 172 lb 9.6 oz 198 lb  Weight (kg) 77.111 kg 78.291 kg 89.812 kg     Body mass index is 25.85 kg/m.  General:  Well nourished, well developed, in no acute distress*** HEENT: normal Neck: no JVD Vascular: No carotid bruits; Distal pulses 2+ bilaterally Cardiac:  normal S1, S2; RRR; no murmur *** Lungs:  clear to auscultation bilaterally, no wheezing, rhonchi or rales  Abd: soft, nontender, no hepatomegaly  Ext: no edema Musculoskeletal:  No deformities, BUE and BLE strength normal and equal Skin: warm and dry  Neuro:  CNs 2-12 intact, no focal abnormalities noted Psych:  Normal affect   EKG:  The EKG was personally reviewed and demonstrates:  *** Telemetry:  Telemetry was personally reviewed and demonstrates:  ***  Relevant CV Studies: ***  Laboratory Data:  High Sensitivity Troponin:  No results for input(s): "TROPONINIHS" in the last 720 hours.   Chemistry Recent Labs  Lab 01/18/23 1845 01/18/23 1851  NA 134* 136  K 4.0 4.1  CL 101 101  CO2 26  --   GLUCOSE 108* 104*  BUN 29* 30*  CREATININE 1.11 1.20  CALCIUM 8.4*  --   GFRNONAA >60  --   ANIONGAP 7  --     Recent Labs  Lab 01/18/23 1845  PROT 5.9*  ALBUMIN 3.4*  AST 22  ALT 13  ALKPHOS 50  BILITOT 0.2*   Lipids No results for input(s): "CHOL", "TRIG", "HDL", "LABVLDL", "LDLCALC", "CHOLHDL" in the last 168 hours.  Hematology Recent Labs  Lab 01/18/23 1845  01/18/23 1851  WBC 7.2  --   RBC 1.79*  --   HGB 4.9* 5.4*  HCT 15.5* 16.0*  MCV 86.6  --   MCH 27.4  --   MCHC 31.6  --   RDW 16.1*  --   PLT 198  --    Thyroid No results for input(s): "TSH", "FREET4" in the last 168 hours.  BNPNo results for input(s): "BNP", "PROBNP" in the last 168 hours.  DDimer No results for input(s): "DDIMER" in the last 168 hours.  Radiology/Studies:  CT ANGIO GI BLEED  Result Date: 01/18/2023 CLINICAL DATA:  Left lower quadrant abdominal pain.  Bloody stools. EXAM: CTA ABDOMEN AND PELVIS WITHOUT AND WITH CONTRAST TECHNIQUE: Multidetector CT imaging of the abdomen and pelvis was performed using the standard protocol during bolus administration of intravenous contrast. Multiplanar reconstructed images and MIPs were obtained and reviewed to evaluate the vascular anatomy. RADIATION DOSE REDUCTION: This exam was performed according to the departmental dose-optimization program which includes automated exposure control, adjustment of the mA and/or kV according to patient size and/or use of iterative reconstruction technique. CONTRAST:  OMNIPAQUE IOHEXOL 350 MG/ML SOLN COMPARISON:  None Available. FINDINGS: Evaluation of this exam is limited due to respiratory motion artifact. VASCULAR Aorta: Moderate atherosclerotic calcification. No aneurysmal dilatation or dissection. No periaortic fluid collection. Celiac: The celiac trunk and its major branches are patent. SMA: The SMA is patent. Renals: The renal arteries are patent. IMA: The IMA is patent. Inflow: Mild atherosclerotic calcification of the iliac arteries. The iliac arteries are patent. No S1 dilatation or dissection. Proximal Outflow: The visualized proximal outflow is patent. Veins: The visualized iliac veins and IVC are unremarkable. The SMV, splenic vein, and main portal vein are patent. No portal venous gas. Review of the MIP images confirms the above findings. NON-VASCULAR Lower chest: The visualized lung  bases are clear. No intra-abdominal free air or free fluid. Hepatobiliary: Cirrhosis. There is mild periportal edema. Cholecystectomy. Pancreas: There is inflammatory changes of the upper abdomen surrounding the pancreas and duodenal C-loop suspicious for acute pancreatitis. Correlation with pancreatic enzymes recommended. No drainable fluid collection/abscess or pseudocyst. No dilatation of the main pancreatic duct or gland atrophy. Spleen: Normal in size without focal abnormality. Adrenals/Urinary Tract: The adrenal glands unremarkable. There is no hydronephrosis or nephrolithiasis on either side. Subcentimeter right renal hypodense lesions are too small to characterize, likely cysts. No imaging follow-up. There is symmetric enhancement of the kidneys. The visualized ureters and urinary bladder appear unremarkable. Stomach/Bowel: Inflammatory changes surrounding the duodenal C-loop likely related to acute pancreatitis. Duodenitis as the cause of the inflammatory process is less likely. There is sigmoid diverticulosis without active inflammatory changes. There is no bowel obstruction. No CT findings of active GI bleed. The appendix is normal. Lymphatic: Mildly enlarged peripancreatic lymph nodes, likely reactive. Reproductive: The prostate and seminal vesicles are grossly unremarkable. Other: None Musculoskeletal: Degenerative changes of the spine. No acute osseous pathology. IMPRESSION: 1. No CT findings of active GI bleed. 2. Findings suspicious for acute pancreatitis. Correlation with pancreatic enzymes recommended. 3. Cirrhosis. 4. Sigmoid diverticulosis. No bowel obstruction. Normal appendix. Electronically Signed   By: Elgie Collard M.D.   On: 01/18/2023 22:14   CT Head Wo Contrast  Result Date: 01/18/2023 CLINICAL DATA:  Head and neck trauma EXAM: CT HEAD WITHOUT CONTRAST CT CERVICAL SPINE WITHOUT CONTRAST TECHNIQUE: Multidetector CT imaging of the head and cervical spine was performed following the  standard protocol without intravenous contrast. Multiplanar CT image reconstructions of the cervical spine were also generated. RADIATION DOSE REDUCTION: This exam was performed according to the departmental dose-optimization program which includes automated exposure control, adjustment of the mA and/or kV according to patient size and/or use of iterative reconstruction technique. COMPARISON:  None Available. FINDINGS: CT HEAD FINDINGS Brain: No acute territorial infarction, hemorrhage or intracranial mass. Moderate white matter hypodensity consistent with chronic small vessel ischemic change. Nonenlarged ventricles Vascular: No hyperdense vessels. Vertebral and carotid vascular calcification Skull: Normal. Negative for fracture or focal lesion. Sinuses/Orbits: Opacified left  maxillary sinus with scattered calcifications Other: None CT CERVICAL SPINE FINDINGS Alignment: Reversal of cervical lordosis. 3 mm anterolisthesis C3 on C4 and C4 on C5. This is likely due to advanced posterior facet degenerative changes. Facet alignment within normal limits. Skull base and vertebrae: No acute fracture. No primary bone lesion or focal pathologic process. Soft tissues and spinal canal: No prevertebral fluid or swelling. No visible canal hematoma. Disc levels: Multilevel degenerative changes. Partial ankylosis C5-C6. Moderate disc space narrowing C4-C5, advanced disc space narrowing C6-C7 and C7-T1. Hypertrophic facet degenerative changes at multiple levels with foraminal stenosis. Upper chest: Negative. Other: None IMPRESSION: 1. No CT evidence for acute intracranial abnormality. 2. Moderate chronic small vessel ischemic changes of the white matter. 3. Reversal of cervical lordosis and multilevel degenerative changes. No acute fracture Electronically Signed   By: Jasmine Pang M.D.   On: 01/18/2023 19:45   CT Cervical Spine Wo Contrast  Result Date: 01/18/2023 CLINICAL DATA:  Head and neck trauma EXAM: CT HEAD WITHOUT  CONTRAST CT CERVICAL SPINE WITHOUT CONTRAST TECHNIQUE: Multidetector CT imaging of the head and cervical spine was performed following the standard protocol without intravenous contrast. Multiplanar CT image reconstructions of the cervical spine were also generated. RADIATION DOSE REDUCTION: This exam was performed according to the departmental dose-optimization program which includes automated exposure control, adjustment of the mA and/or kV according to patient size and/or use of iterative reconstruction technique. COMPARISON:  None Available. FINDINGS: CT HEAD FINDINGS Brain: No acute territorial infarction, hemorrhage or intracranial mass. Moderate white matter hypodensity consistent with chronic small vessel ischemic change. Nonenlarged ventricles Vascular: No hyperdense vessels. Vertebral and carotid vascular calcification Skull: Normal. Negative for fracture or focal lesion. Sinuses/Orbits: Opacified left maxillary sinus with scattered calcifications Other: None CT CERVICAL SPINE FINDINGS Alignment: Reversal of cervical lordosis. 3 mm anterolisthesis C3 on C4 and C4 on C5. This is likely due to advanced posterior facet degenerative changes. Facet alignment within normal limits. Skull base and vertebrae: No acute fracture. No primary bone lesion or focal pathologic process. Soft tissues and spinal canal: No prevertebral fluid or swelling. No visible canal hematoma. Disc levels: Multilevel degenerative changes. Partial ankylosis C5-C6. Moderate disc space narrowing C4-C5, advanced disc space narrowing C6-C7 and C7-T1. Hypertrophic facet degenerative changes at multiple levels with foraminal stenosis. Upper chest: Negative. Other: None IMPRESSION: 1. No CT evidence for acute intracranial abnormality. 2. Moderate chronic small vessel ischemic changes of the white matter. 3. Reversal of cervical lordosis and multilevel degenerative changes. No acute fracture Electronically Signed   By: Jasmine Pang M.D.   On:  01/18/2023 19:45     Assessment and Plan:   ***   Risk Assessment/Risk Scores:  {Complete the following score calculators/questions to meet required metrics.  Press F2         :[id number]}   {Is the patient being seen for unstable angina, ACS, NSTEMI or STEMI?[phone]} {Does this patient have CHF or CHF symptoms?      :[id number]} {Does this patient have ATRIAL FIBRILLATION?:[phone]}  {Are we signing off today?:[id number]}  For questions or updates, please contact [facility] Please consult [email or web address] for contact info under    Signed, Ralene Ok, MD  01/19/2023 12:02 AM

## 2023-01-19 NOTE — ED Notes (Signed)
Pt tolerated infusion well, no adverse effects noted. Per Blood Bank request, hemoglobin to be re-checked to verify necessity due to blood shortage. Per MD, if hemoglobin less than 8 proceed with third unit

## 2023-01-19 NOTE — Assessment & Plan Note (Signed)
Hold home BP meds for the moment given presentation with acute GIB.

## 2023-01-19 NOTE — ED Notes (Signed)
Help patient up to the bedside toilet patient has call bell in reach

## 2023-01-19 NOTE — Assessment & Plan Note (Signed)
Cirrhotic appearance of liver on imaging. If he has cirrhosis he's either compensated or mostly compensated with just MCI. LFTs nl T.Bili nl INR elevated -> but he's on coumadin Ammonia level pending. Likely will need further work up and follow up with GI in future, but deferring much of this for now while we focus on his life threatening GI bleed

## 2023-01-19 NOTE — Assessment & Plan Note (Addendum)
Question of acute pancreatitis. Being called on imaging study; however, Lipase normal, only having minimal epigastric discomfort. Will go ahead and work this up further, though not entirely convinced he has acute pancreatitis today No EtOH intake H/o cholecystectomy in past May want to MRCP for retained stone, but will put this on hold for the moment as his GIB is the most pressing issue Retained stone seems a bit less likely though with normal LFTs Repeat CMP in AM Check triglycerides Pt already NPO for the GIB. Already getting large volume of fluids in the form of 3U PRBC transfusion.

## 2023-01-19 NOTE — Consult Note (Addendum)
Consultation  Referring Provider:  [facility]  Primary Care Physician:  Kathlee Nations, MD Primary Gastroenterologist:  Gordy Savers Reason for Consultation:     GI bleed  LOS: 0 days          HPI:   Lucas Bowman is a 72 y.o. male with past medical history significant for A-fib, mechanical AVR, chronic Coumadin for mechanical valve, ascending aortic aneurysm s/p manage for repair, hypertension, presents for evaluation of GI bleed.  Per chart review patient presented to emergency department with hematemesis and maroon stools in addition to some mild LLQ pain.  After speaking with the patient he reports 2 days of dark black stools and nonbloody bilious emesis.  Also has some mild LLQ pain associated with BMs.  Denies alcohol and tobacco use.  Reports occasional NSAID use for joint pain.  Denies previous episodes of this.  Reports increased fatigue.  States he has had increased phlegm lately for which she has been taking Mucinex and is also had some increased GERD.  Reports weight loss (unintentional) of 10 to 15 pounds over the last few months.  Patient presented with significant anemia with Hgb 4.9.  Now 6.5 s/p 3 units PRBCs  Patient states he has not been told he has cirrhosis or fatty liver in the past.  Cirrhosis seen on CTA.  Upon review of chart no previous imaging in the last 15 years of abdomen.  No previous elevation of LFTs.  Pertinent lab values Positive fecal occult PT 28.6 INR 2.7 Initial CBC with hemoglobin 4.9 (last hemoglobin was in 2015 which was 13.9) MCV 86.6 Normal LFTs CTA shows no active GI bleed.  Inflammatory changes of upper abdomen around the pancreas and duodenal C-loop suspicious for acute pancreatitis (lipase 52).  Cirrhosis with mild periportal edema.  S/p cholecystectomy.  Sigmoid diverticulosis.  Family history of colon cancer in brother diagnosed age 78.  History of colonic adenoma though on the last colonoscopy in 2015 2 small benign polyps.  Last  seen in 2020 by his primary GI Dr. Jena Gauss for intermittent hematochezia and constipation.  Noted to have some white matter disease that his neurologist told him was related to mini strokes and advised him to never come off of Coumadin.  Given his comorbidities Dr. Jena Gauss offered 1 more surveillance colonoscopy while on Coumadin but it appears this was not followed through. Followed by cardiology.  PREVIOUS GI WORKUP--------------------------------------------------  Colonoscopy 04/04/2014 with Dr. Jena Gauss for surveillance of previous tubular adenomas: Multiple diminutive polyps (hyperplastic and polyploid colonic mucosa), colonic diverticulosis  EGD 04/04/2014 with Dr. Jena Gauss for dysphagia: Normal esophagus s/p passage of Maloney dilator empirically.  Abnormal gastric mucosa negative for H. pylori.  Past Medical History:  Diagnosis Date   Anemia    Depression    Gastritis    GERD (gastroesophageal reflux disease)    Gout    H/O aortic valve replacement    Hemorrhoids    Hiatal hernia    Internal hemorrhoids    Irritable bowel syndrome    Mitral insufficiency    Neck pain    Septic shock (HCC) 03/06/2009   Tubular adenoma     Surgical History:  He  has a past surgical history that includes Coronary angioplasty; Esophagogastroduodenoscopy (01/28/2009); Colonoscopy (01/28/2009); Colonoscopy (02/17/2006); Esophagogastroduodenoscopy (02/17/2006); Gallbladder surgery; Colonoscopy with propofol (N/A, 04/04/2014); Esophagogastroduodenoscopy (egd) with propofol (N/A, 04/04/2014); maloney dilation (N/A, 04/04/2014); polypectomy (N/A, 04/04/2014); and biopsy (N/A, 04/04/2014). Family History:  His family history is not on file.  Social History:   reports that he has never smoked. He has never used smokeless tobacco. He reports that he does not drink alcohol and does not use drugs.  Prior to Admission medications   Medication Sig Start Date End Date Taking? Authorizing Provider  albuterol (VENTOLIN HFA) 108  (90 Base) MCG/ACT inhaler Inhale 1-2 puffs into the lungs 2 (two) times daily as needed for wheezing or shortness of breath. 04/24/19  Yes [provider]  allopurinol (ZYLOPRIM) 300 MG tablet Take 300 mg by mouth daily.  01/28/14  Yes [provider]  benazepril (LOTENSIN) 5 MG tablet Take 5 mg by mouth at bedtime.   Yes [provider]  carvedilol (COREG) 6.25 MG tablet Take 6.25 mg by mouth 2 (two) times daily with a meal. 02/12/14  Yes [provider]  Cholecalciferol (VITAMIN D) 50 MCG (2000 UT) CAPS Take 1 capsule by mouth daily.   Yes [provider]  cyclobenzaprine (FLEXERIL) 10 MG tablet Take 10 mg by mouth at bedtime as needed for muscle spasms. 04/10/19  Yes [provider]  dicyclomine (BENTYL) 10 MG capsule Take 10 mg by mouth daily as needed for spasms.   Yes [provider]  DULoxetine (CYMBALTA) 30 MG capsule Take 30 mg by mouth daily.   Yes [provider]  fluticasone (FLONASE) 50 MCG/ACT nasal spray Place 2 sprays into both nostrils daily.   Yes [provider]  furosemide (LASIX) 20 MG tablet Take 20 mg by mouth daily. 02/12/14  Yes [provider]  gemfibrozil (LOPID) 600 MG tablet Take 600 mg by mouth daily.  02/04/14  Yes [provider]  HYDROcodone-acetaminophen (NORCO) 10-325 MG per tablet Take 1 tablet by mouth every 6 (six) hours as needed for moderate pain.  02/18/14  Yes [provider]  loratadine (CLARITIN) 10 MG tablet Take 10 mg by mouth daily.   Yes [provider]  Multiple Vitamin (MULTIVITAMIN) tablet Take 1 tablet by mouth daily.   Yes [provider]  naloxone (NARCAN) nasal spray 4 mg/0.1 mL Place 1 spray into the nose as needed.   Yes [provider]  nystatin cream (MYCOSTATIN) Apply 1 application  topically 2 (two) times daily as needed for dry skin.   Yes [provider]  ondansetron (ZOFRAN-ODT) 4 MG disintegrating  tablet Take 4 mg by mouth every 8 (eight) hours as needed for nausea or vomiting.  02/05/14  Yes [provider]  pantoprazole (PROTONIX) 40 MG tablet Take 40 mg by mouth daily.  01/28/14  Yes [provider]  traZODone (DESYREL) 100 MG tablet Take 100 mg by mouth at bedtime. 04/21/19  Yes [provider]  warfarin (COUMADIN) 5 MG tablet Take 5 mg by mouth See admin instructions. Except for tues and thurs - takes 2.5mg . 12/17/13  Yes [provider]    Current Facility-Administered Medications  Medication Dose Route Frequency Provider Last Rate Last Admin   acetaminophen (TYLENOL) tablet 650 mg  650 mg Oral Q6H PRN Hillary Bow, DO       Or   acetaminophen (TYLENOL) suppository 650 mg  650 mg Rectal Q6H PRN Hillary Bow, DO       albuterol (PROVENTIL) (2.5 MG/3ML) 0.083% nebulizer solution 3 mL  3 mL Inhalation BID PRN Hillary Bow, DO       allopurinol (ZYLOPRIM) tablet 300 mg  300 mg Oral Daily Lyda Perone M, DO       cyclobenzaprine (FLEXERIL) tablet 10  mg  10 mg Oral QHS PRN Hillary Bow, DO   10 mg at 01/19/23 0131   DULoxetine (CYMBALTA) DR capsule 30 mg  30 mg Oral Daily Hillary Bow, DO       HYDROcodone-acetaminophen [facility]) 10-325 MG per tablet 1 tablet  1 tablet Oral Q6H PRN Hillary Bow, DO       loratadine (CLARITIN) tablet 10 mg  10 mg Oral Daily Julian Reil, Jared M, DO       multivitamin with minerals tablet 1 tablet  1 tablet Oral Daily Julian Reil, Jared M, DO       ondansetron [facility]) tablet 4 mg  4 mg Oral Q6H PRN Hillary Bow, DO       Or   ondansetron [facility]) injection 4 mg  4 mg Intravenous Q6H PRN Hillary Bow, DO       pantoprozole (PROTONIX) 80 mg /NS 100 mL infusion  8 mg/hr Intravenous Continuous Rondel Baton, MD 10 mL/hr at 01/19/23 7829 8 mg/hr at 01/19/23 5621   traZODone (DESYREL) tablet 100 mg  100 mg Oral QHS Hillary Bow, DO   100 mg at 01/19/23 0131   Current Outpatient Medications   Medication Sig Dispense Refill   albuterol (VENTOLIN HFA) 108 (90 Base) MCG/ACT inhaler Inhale 1-2 puffs into the lungs 2 (two) times daily as needed for wheezing or shortness of breath.     allopurinol (ZYLOPRIM) 300 MG tablet Take 300 mg by mouth daily.      benazepril (LOTENSIN) 5 MG tablet Take 5 mg by mouth at bedtime.     carvedilol (COREG) 6.25 MG tablet Take 6.25 mg by mouth 2 (two) times daily with a meal.     Cholecalciferol (VITAMIN D) 50 MCG (2000 UT) CAPS Take 1 capsule by mouth daily.     cyclobenzaprine (FLEXERIL) 10 MG tablet Take 10 mg by mouth at bedtime as needed for muscle spasms.     dicyclomine (BENTYL) 10 MG capsule Take 10 mg by mouth daily as needed for spasms.     DULoxetine (CYMBALTA) 30 MG capsule Take 30 mg by mouth daily.     fluticasone (FLONASE) 50 MCG/ACT nasal spray Place 2 sprays into both nostrils daily.     furosemide (LASIX) 20 MG tablet Take 20 mg by mouth daily.     gemfibrozil (LOPID) 600 MG tablet Take 600 mg by mouth daily.      HYDROcodone-acetaminophen (NORCO) 10-325 MG per tablet Take 1 tablet by mouth every 6 (six) hours as needed for moderate pain.      loratadine (CLARITIN) 10 MG tablet Take 10 mg by mouth daily.     Multiple Vitamin (MULTIVITAMIN) tablet Take 1 tablet by mouth daily.     naloxone (NARCAN) nasal spray 4 mg/0.1 mL Place 1 spray into the nose as needed.     nystatin cream (MYCOSTATIN) Apply 1 application  topically 2 (two) times daily as needed for dry skin.     ondansetron (ZOFRAN-ODT) 4 MG disintegrating tablet Take 4 mg by mouth every 8 (eight) hours as needed for nausea or vomiting.      pantoprazole (PROTONIX) 40 MG tablet Take 40 mg by mouth daily.      traZODone (DESYREL) 100 MG tablet Take 100 mg by mouth at bedtime.     warfarin (COUMADIN) 5 MG tablet Take 5 mg by mouth See admin instructions. Except for tues and thurs - takes 2.5mg .      Allergies as of 01/18/2023 -  Review Complete 01/18/2023  Allergen Reaction Noted    Other  03/08/2019   Sulfonamide derivatives Other (See Comments)     Review of Systems  Constitutional:  Positive for malaise/fatigue and weight loss. Negative for chills and fever.  HENT:  Negative for hearing loss and tinnitus.   Eyes:  Negative for blurred vision and double vision.  Respiratory:  Negative for cough and hemoptysis.   Cardiovascular:  Negative for chest pain and palpitations.  Gastrointestinal:  Positive for abdominal pain, diarrhea, heartburn, melena, nausea and vomiting. Negative for blood in stool and constipation.  Genitourinary:  Negative for dysuria and urgency.  Musculoskeletal:  Negative for myalgias and neck pain.  Skin:  Negative for rash.  Neurological:  Positive for weakness. Negative for dizziness, seizures, loss of consciousness and headaches.  Psychiatric/Behavioral:  Negative for depression and suicidal ideas.        Physical Exam:  Vital signs in last 24 hours: Temp:  [97.8 F (36.6 C)-98.7 F (37.1 C)] 97.8 F (36.6 C) (07/24 0739) Pulse Rate:  [50-114] 93 (07/24 0739) Resp:  [13-24] 17 (07/24 0739) BP: (95-136)/(57-96) 125/81 (07/24 0739) SpO2:  [93 %-100 %] 100 % (07/24 0739) Weight:  [77.1 kg] 77.1 kg (07/23 1833) Last BM Date : 01/18/23 Last BM recorded by nurses in past 5 days No data recorded  Physical Exam Constitutional:      Appearance: He is ill-appearing.  HENT:     Head: Normocephalic and atraumatic.     Nose: Nose normal. No congestion.     Mouth/Throat:     Mouth: Mucous membranes are dry.  Eyes:     Comments: Conjunctival pallor  Cardiovascular:     Rate and Rhythm: Normal rate and regular rhythm.  Pulmonary:     Effort: Pulmonary effort is normal. No respiratory distress.  Abdominal:     General: Abdomen is flat. Bowel sounds are normal. There is no distension.     Palpations: Abdomen is soft. There is no mass.     Tenderness: There is no abdominal tenderness. There is no guarding or rebound.     Hernia: No  hernia is present.  Musculoskeletal:        General: No swelling. Normal range of motion.     Cervical back: Normal range of motion and neck supple.  Skin:    General: Skin is warm and dry.  Neurological:     General: No focal deficit present.     Mental Status: He is alert and oriented to person, place, and time.     Comments: Oriented but takes time to respond to questions  Psychiatric:        Mood and Affect: Mood normal.        Behavior: Behavior normal.        Thought Content: Thought content normal.        Judgment: Judgment normal.      LAB RESULTS: Recent Labs    01/18/23 1845 01/18/23 1851 01/19/23 0333  WBC 7.2  --   --   HGB 4.9* 5.4* 6.5*  HCT 15.5* 16.0* 19.8*  PLT 198  --   --    BMET Recent Labs    01/18/23 1845 01/18/23 1851  NA 134* 136  K 4.0 4.1  CL 101 101  CO2 26  --   GLUCOSE 108* 104*  BUN 29* 30*  CREATININE 1.11 1.20  CALCIUM 8.4*  --    LFT Recent Labs    01/18/23 1845  PROT  5.9*  ALBUMIN 3.4*  AST 22  ALT 13  ALKPHOS 50  BILITOT 0.2*   PT/INR Recent Labs    01/18/23 1845  LABPROT 28.6*  INR 2.7*    STUDIES: CT ANGIO GI BLEED  Result Date: 01/18/2023 CLINICAL DATA:  Left lower quadrant abdominal pain.  Bloody stools. EXAM: CTA ABDOMEN AND PELVIS WITHOUT AND WITH CONTRAST TECHNIQUE: Multidetector CT imaging of the abdomen and pelvis was performed using the standard protocol during bolus administration of intravenous contrast. Multiplanar reconstructed images and MIPs were obtained and reviewed to evaluate the vascular anatomy. RADIATION DOSE REDUCTION: This exam was performed according to the departmental dose-optimization program which includes automated exposure control, adjustment of the mA and/or kV according to patient size and/or use of iterative reconstruction technique. CONTRAST:  OMNIPAQUE IOHEXOL 350 MG/ML SOLN COMPARISON:  None Available. FINDINGS: Evaluation of this exam is limited due to respiratory motion  artifact. VASCULAR Aorta: Moderate atherosclerotic calcification. No aneurysmal dilatation or dissection. No periaortic fluid collection. Celiac: The celiac trunk and its major branches are patent. SMA: The SMA is patent. Renals: The renal arteries are patent. IMA: The IMA is patent. Inflow: Mild atherosclerotic calcification of the iliac arteries. The iliac arteries are patent. No S1 dilatation or dissection. Proximal Outflow: The visualized proximal outflow is patent. Veins: The visualized iliac veins and IVC are unremarkable. The SMV, splenic vein, and main portal vein are patent. No portal venous gas. Review of the MIP images confirms the above findings. NON-VASCULAR Lower chest: The visualized lung bases are clear. No intra-abdominal free air or free fluid. Hepatobiliary: Cirrhosis. There is mild periportal edema. Cholecystectomy. Pancreas: There is inflammatory changes of the upper abdomen surrounding the pancreas and duodenal C-loop suspicious for acute pancreatitis. Correlation with pancreatic enzymes recommended. No drainable fluid collection/abscess or pseudocyst. No dilatation of the main pancreatic duct or gland atrophy. Spleen: Normal in size without focal abnormality. Adrenals/Urinary Tract: The adrenal glands unremarkable. There is no hydronephrosis or nephrolithiasis on either side. Subcentimeter right renal hypodense lesions are too small to characterize, likely cysts. No imaging follow-up. There is symmetric enhancement of the kidneys. The visualized ureters and urinary bladder appear unremarkable. Stomach/Bowel: Inflammatory changes surrounding the duodenal C-loop likely related to acute pancreatitis. Duodenitis as the cause of the inflammatory process is less likely. There is sigmoid diverticulosis without active inflammatory changes. There is no bowel obstruction. No CT findings of active GI bleed. The appendix is normal. Lymphatic: Mildly enlarged peripancreatic lymph nodes, likely reactive.  Reproductive: The prostate and seminal vesicles are grossly unremarkable. Other: None Musculoskeletal: Degenerative changes of the spine. No acute osseous pathology. IMPRESSION: 1. No CT findings of active GI bleed. 2. Findings suspicious for acute pancreatitis. Correlation with pancreatic enzymes recommended. 3. Cirrhosis. 4. Sigmoid diverticulosis. No bowel obstruction. Normal appendix. Electronically Signed   By: Elgie Collard M.D.   On: 01/18/2023 22:14   CT Head Wo Contrast  Result Date: 01/18/2023 CLINICAL DATA:  Head and neck trauma EXAM: CT HEAD WITHOUT CONTRAST CT CERVICAL SPINE WITHOUT CONTRAST TECHNIQUE: Multidetector CT imaging of the head and cervical spine was performed following the standard protocol without intravenous contrast. Multiplanar CT image reconstructions of the cervical spine were also generated. RADIATION DOSE REDUCTION: This exam was performed according to the departmental dose-optimization program which includes automated exposure control, adjustment of the mA and/or kV according to patient size and/or use of iterative reconstruction technique. COMPARISON:  None Available. FINDINGS: CT HEAD FINDINGS Brain: No acute territorial infarction, hemorrhage or intracranial  mass. Moderate white matter hypodensity consistent with chronic small vessel ischemic change. Nonenlarged ventricles Vascular: No hyperdense vessels. Vertebral and carotid vascular calcification Skull: Normal. Negative for fracture or focal lesion. Sinuses/Orbits: Opacified left maxillary sinus with scattered calcifications Other: None CT CERVICAL SPINE FINDINGS Alignment: Reversal of cervical lordosis. 3 mm anterolisthesis C3 on C4 and C4 on C5. This is likely due to advanced posterior facet degenerative changes. Facet alignment within normal limits. Skull base and vertebrae: No acute fracture. No primary bone lesion or focal pathologic process. Soft tissues and spinal canal: No prevertebral fluid or swelling. No  visible canal hematoma. Disc levels: Multilevel degenerative changes. Partial ankylosis C5-C6. Moderate disc space narrowing C4-C5, advanced disc space narrowing C6-C7 and C7-T1. Hypertrophic facet degenerative changes at multiple levels with foraminal stenosis. Upper chest: Negative. Other: None IMPRESSION: 1. No CT evidence for acute intracranial abnormality. 2. Moderate chronic small vessel ischemic changes of the white matter. 3. Reversal of cervical lordosis and multilevel degenerative changes. No acute fracture Electronically Signed   By: Jasmine Pang M.D.   On: 01/18/2023 19:45   CT Cervical Spine Wo Contrast  Result Date: 01/18/2023 CLINICAL DATA:  Head and neck trauma EXAM: CT HEAD WITHOUT CONTRAST CT CERVICAL SPINE WITHOUT CONTRAST TECHNIQUE: Multidetector CT imaging of the head and cervical spine was performed following the standard protocol without intravenous contrast. Multiplanar CT image reconstructions of the cervical spine were also generated. RADIATION DOSE REDUCTION: This exam was performed according to the departmental dose-optimization program which includes automated exposure control, adjustment of the mA and/or kV according to patient size and/or use of iterative reconstruction technique. COMPARISON:  None Available. FINDINGS: CT HEAD FINDINGS Brain: No acute territorial infarction, hemorrhage or intracranial mass. Moderate white matter hypodensity consistent with chronic small vessel ischemic change. Nonenlarged ventricles Vascular: No hyperdense vessels. Vertebral and carotid vascular calcification Skull: Normal. Negative for fracture or focal lesion. Sinuses/Orbits: Opacified left maxillary sinus with scattered calcifications Other: None CT CERVICAL SPINE FINDINGS Alignment: Reversal of cervical lordosis. 3 mm anterolisthesis C3 on C4 and C4 on C5. This is likely due to advanced posterior facet degenerative changes. Facet alignment within normal limits. Skull base and vertebrae: No  acute fracture. No primary bone lesion or focal pathologic process. Soft tissues and spinal canal: No prevertebral fluid or swelling. No visible canal hematoma. Disc levels: Multilevel degenerative changes. Partial ankylosis C5-C6. Moderate disc space narrowing C4-C5, advanced disc space narrowing C6-C7 and C7-T1. Hypertrophic facet degenerative changes at multiple levels with foraminal stenosis. Upper chest: Negative. Other: None IMPRESSION: 1. No CT evidence for acute intracranial abnormality. 2. Moderate chronic small vessel ischemic changes of the white matter. 3. Reversal of cervical lordosis and multilevel degenerative changes. No acute fracture Electronically Signed   By: Jasmine Pang M.D.   On: 01/18/2023 19:45      Impression    72 year old male history of prosthetic aortic valve on Coumadin presenting with symptomatic anemia and initial Hgb of 4.9 in the setting of melena and questionable hematemesis with CTA showing inflammatory changes in the upper abdomen around the duodenal C-loop.  With intermittent NSAID use suspicious for duodenitis, PUD, gastritis, esophagitis, or even varices with new finding of cirrhosis on CTA (less likely varices).  History of H. pylori negative gastritis on EGD in 2015 Upper GI bleed Hgb 6.5 s/p 3 units PRBC Positive fecal occult PT 28.6 INR 2.7 MCV 86.6 CTA shows no active GI bleed.  Inflammatory changes of upper abdomen around the pancreas and duodenal C-loop suspicious  for acute pancreatitis (lipase 52).  Cirrhosis with mild periportal edema.  S/p cholecystectomy.  Sigmoid diverticulosis.  Cirrhosis on CTA Normal LFTs No previous abdominal imaging. Denies alcohol use.  S/p mechanical AVR and mitral valve repair on Coumadin PT 28.6/INR 2.7  Aneurysm of ascending aorta  Persistent afib   Plan   - Likely EGD for further evaluation. Will defer to Dr. Tomasa Rand about timing based on his elevated INR. Would prefer INR to be below 2. Possible procedure  tomorrow versus Friday pending INR. Cardiology currently holding warfarin. - Continue protonix infusion - Continue daily CBC and transfuse as needed to maintain HGB > 7  - Appreciate cardiology following - normal LFTs, continue to monitor  Thank you for your kind consultation, we will continue to follow.   Deniece Rankin Leanna Sato  01/19/2023, 8:05 AM

## 2023-01-19 NOTE — Progress Notes (Signed)
   Patient is a 72 year old male with permanent atrial fibrillation, history of mechanical aortic valve replacement on chronic warfarin, history of ascending aortic aneurysm s/p mesh repair, mitral valve repair, HTN, HLD, diverticulosis, and GERD. Patient is followed by a cardiologist at [facility] and was previously followed by a cardiologist in [place]. Patient presented to the ED yesterday evening complaining of nausea, vomiting, diarrhea for 5 days. Also complained of intermittent bloody stools. Hemoglobin was down to 4.9 in the ED.  Patient was admitted to the internal medicine service for treatment of upper GI bleed and acute blood loss anemia. Transfused 3 units PRBCs. Cardiology for anticoagulation management.   Patient was seen by the overnight fellow early this AM who recommended holding coumadin and following daily INRs. INR 2.7 yesterday. I evaluated patient in the ED. Overall, patient reports feeling a bit better after receiving blood transfusion. Still feels a bit short of breath and weak.   Follow-up GI recommendations and plan to resume anticoagulation when cleared from a GI standpoint.   Jonita Albee, PA-C 01/19/2023 10:15 AM

## 2023-01-19 NOTE — Assessment & Plan Note (Addendum)
Due to UGIB: 3u PRBC transfusion ordered H/H Q6H to start after transfusions Tele monitor

## 2023-01-19 NOTE — Progress Notes (Signed)
New patient from the ed with gi bleed. Patient denies complaints. Patient is a/o x4 and mae's x4. Patient had to stand to void. MP shows STACH. Family at bedside with patient. Patient has a protonix drip infusing. Patient oriented to room bed and belongings policy. Patient to keep belongings at bedside. No meds with patient. No skin issues noted checked with Horris Latino RN.

## 2023-01-19 NOTE — H&P (Signed)
History and Physical    Patient: Lucas Bowman QMV:[id number] DOB: 08/14/1950 DOA: 01/18/2023 DOS: the patient was seen and examined on 01/19/2023 PCP: Kathlee Nations, MD  Patient coming from: Home  Chief Complaint:  Chief Complaint  Patient presents with   Emesis   Diarrhea   HPI: Lucas Bowman is a 72 y.o. male with medical history significant of Mechanical AVR, mitral valve repair / annuloplasty in 2001, A.Fib, on chronic coumadin for mechanical valve, HTN.  Pt in to ED with onset of GIB with hematemesis and last night.  Maroon stools initially onset 3 days ago and persistent.  Initially vomiting only mildly bloody but then became dark with more blood.  Some mild LLQ pain.  No h/o EtOH use.  Has had presyncope with this and fatigue.   Review of Systems: As mentioned in the history of present illness. All other systems reviewed and are negative. Past Medical History:  Diagnosis Date   Anemia    Depression    Gastritis    GERD (gastroesophageal reflux disease)    Gout    H/O aortic valve replacement    Hemorrhoids    Hiatal hernia    Internal hemorrhoids    Irritable bowel syndrome    Mitral insufficiency    Neck pain    Septic shock (HCC) 03/06/2009   Tubular adenoma    Past Surgical History:  Procedure Laterality Date   BIOPSY N/A 04/04/2014   Procedure: GASTRIC BIOPSIES;  Surgeon: Corbin Ade, MD;  Location: [facility];  Service: Endoscopy;  Laterality: N/A;   COLONOSCOPY  01/28/2009   RMR: Internal hemorrhoids, otherwise normal rectum/Sigmoid polyp, status post hot snare polypectomy.ADENOMATOUS POLYP    COLONOSCOPY  02/17/2006   Dr.Rourk-Normal rectum.  Left colon polyps= tubular adenoma,Remainder of the colonic mucosa appeared normal.    COLONOSCOPY WITH PROPOFOL N/A 04/04/2014   Procedure: COLONOSCOPY WITH PROPOFOL;  Surgeon: Corbin Ade, MD;  Location: [facility];  Service: Endoscopy;  Laterality: N/A;  1018 in cecum, total withdrawal time:   CORONARY  ANGIOPLASTY     ESOPHAGOGASTRODUODENOSCOPY  01/28/2009   MWU:XLKG distal esophageal erosions consistent with mild erosive reflux esophagitis/ Small hiatal hernia otherwise.  Upper GI tract appeared normal.   ESOPHAGOGASTRODUODENOSCOPY  02/17/2006   Dr.Rourk-Normal esophagus, small hiatal hernia, otherwise normal stomach, D1 and D2   ESOPHAGOGASTRODUODENOSCOPY (EGD) WITH PROPOFOL N/A 04/04/2014   Procedure: ESOPHAGOGASTRODUODENOSCOPY (EGD) WITH PROPOFOL;  Surgeon: Corbin Ade, MD;  Location: [facility];  Service: Endoscopy;  Laterality: N/A;   GALLBLADDER SURGERY     MALONEY DILATION N/A 04/04/2014   Procedure: Elease Hashimoto DILATION;  Surgeon: Corbin Ade, MD;  Location: [facility];  Service: Endoscopy;  Laterality: N/A;  #56 maloney dilator   POLYPECTOMY N/A 04/04/2014   Procedure: POLYPECTOMY;  Surgeon: Corbin Ade, MD;  Location: [facility];  Service: Endoscopy;  Laterality: N/A;   Social History:  reports that he has never smoked. He has never used smokeless tobacco. He reports that he does not drink alcohol and does not use drugs.  Allergies  Allergen Reactions   Other     Pneumonia shot-redness, swelling   Sulfonamide Derivatives Other (See Comments)    History reviewed. No pertinent family history.  Prior to Admission medications   Medication Sig Start Date End Date Taking? Authorizing Provider  albuterol (VENTOLIN HFA) 108 (90 Base) MCG/ACT inhaler Inhale 1-2 puffs into the lungs 2 (two) times daily as needed for wheezing or shortness of breath. 04/24/19  Yes [provider]  allopurinol (ZYLOPRIM) 300 MG tablet Take 300 mg by mouth daily.  01/28/14  Yes [provider]  benazepril (LOTENSIN) 5 MG tablet Take 5 mg by mouth at bedtime.   Yes [provider]  carvedilol (COREG) 6.25 MG tablet Take 6.25 mg by mouth 2 (two) times daily with a meal. 02/12/14  Yes [provider]  Cholecalciferol (VITAMIN D) 50 MCG (2000 UT) CAPS Take 1 capsule by mouth daily.    Yes [provider]  cyclobenzaprine (FLEXERIL) 10 MG tablet Take 10 mg by mouth at bedtime as needed for muscle spasms. 04/10/19  Yes [provider]  dicyclomine (BENTYL) 10 MG capsule Take 10 mg by mouth daily as needed for spasms.   Yes [provider]  DULoxetine (CYMBALTA) 30 MG capsule Take 30 mg by mouth daily.   Yes [provider]  fluticasone (FLONASE) 50 MCG/ACT nasal spray Place 2 sprays into both nostrils daily.   Yes [provider]  furosemide (LASIX) 20 MG tablet Take 20 mg by mouth daily. 02/12/14  Yes [provider]  gemfibrozil (LOPID) 600 MG tablet Take 600 mg by mouth daily.  02/04/14  Yes [provider]  HYDROcodone-acetaminophen (NORCO) 10-325 MG per tablet Take 1 tablet by mouth every 6 (six) hours as needed for moderate pain.  02/18/14  Yes [provider]  loratadine (CLARITIN) 10 MG tablet Take 10 mg by mouth daily.   Yes [provider]  Multiple Vitamin (MULTIVITAMIN) tablet Take 1 tablet by mouth daily.   Yes [provider]  naloxone (NARCAN) nasal spray 4 mg/0.1 mL Place 1 spray into the nose as needed.   Yes [provider]  nystatin cream (MYCOSTATIN) Apply 1 application  topically 2 (two) times daily as needed for dry skin.   Yes [provider]  ondansetron (ZOFRAN-ODT) 4 MG disintegrating tablet Take 4 mg by mouth every 8 (eight) hours as needed for nausea or vomiting.  02/05/14  Yes [provider]  pantoprazole (PROTONIX) 40 MG tablet Take 40 mg by mouth daily.  01/28/14  Yes [provider]  traZODone (DESYREL) 100 MG tablet Take 100 mg by mouth at bedtime. 04/21/19  Yes [provider]  warfarin (COUMADIN) 5 MG tablet Take 5 mg by mouth See admin instructions. Except for tues and thurs - takes 2.5mg . 12/17/13  Yes [provider]  polyethylene glycol-electrolytes (TRILYTE) 420 g solution Take 4,000 mLs by mouth as  directed. Patient not taking: Reported on 01/18/2023 03/08/19   Corbin Ade, MD    Physical Exam: Vitals:   01/18/23 2315 01/18/23 2345 01/19/23 0004 01/19/23 0036  BP: 119/67 122/69 120/89 123/72  Pulse: 95 (!) 106 95 72  Resp: (!) 24 (!) 21 20 20   Temp:   98.5 F (36.9 C) 98.1 F (36.7 C)  TempSrc:   Oral Oral  SpO2: 100% 100% 100% 100%  Weight:      Height:       Constitutional: NAD, calm, comfortable, Pale Respiratory: clear to auscultation bilaterally, no wheezing, no crackles. Normal respiratory effort. No accessory muscle use.  Cardiovascular: Regular rate and rhythm, no murmurs / rubs / gallops. No extremity edema. 2+ pedal pulses. No carotid bruits.  Abdomen: no tenderness, no masses palpated. No hepatosplenomegaly. Bowel sounds positive.  Neurologic: CN 2-12 grossly intact. Sensation intact, DTR normal. Strength 5/5 in all 4.  Psychiatric: Normal judgment and insight. Alert and oriented x 3. Normal mood.  Data Reviewed:    Labs on Admission: I have personally reviewed following labs and imaging studies  CBC: Recent Labs  Lab 01/18/23 1845 01/18/23 1851  WBC 7.2  --   NEUTROABS 4.7  --   HGB 4.9* 5.4*  HCT 15.5* 16.0*  MCV 86.6  --   PLT 198  --    Basic Metabolic Panel: Recent Labs  Lab 01/18/23 1845 01/18/23 1851  NA 134* 136  K 4.0 4.1  CL 101 101  CO2 26  --   GLUCOSE 108* 104*  BUN 29* 30*  CREATININE 1.11 1.20  CALCIUM 8.4*  --    GFR: Estimated Creatinine Clearance: 53 mL/min (by C-G formula based on SCr of 1.2 mg/dL). Liver Function Tests: Recent Labs  Lab 01/18/23 1845  AST 22  ALT 13  ALKPHOS 50  BILITOT 0.2*  PROT 5.9*  ALBUMIN 3.4*   Recent Labs  Lab 01/18/23 1845  LIPASE 54*   No results for input(s): "AMMONIA" in the last 168 hours. Coagulation Profile: Recent Labs  Lab 01/18/23 1845  INR 2.7*   Cardiac Enzymes: No results for input(s): "CKTOTAL", "CKMB", "CKMBINDEX", "TROPONINI" in the last 168 hours. BNP  (last 3 results) No results for input(s): "PROBNP" in the last 8760 hours. HbA1C: No results for input(s): "HGBA1C" in the last 72 hours. CBG: No results for input(s): "GLUCAP" in the last 168 hours. Lipid Profile: No results for input(s): "CHOL", "HDL", "LDLCALC", "TRIG", "CHOLHDL", "LDLDIRECT" in the last 72 hours. Thyroid Function Tests: No results for input(s): "TSH", "T4TOTAL", "FREET4", "T3FREE", "THYROIDAB" in the last 72 hours. Anemia Panel: No results for input(s): "VITAMINB12", "FOLATE", "FERRITIN", "TIBC", "IRON", "RETICCTPCT" in the last 72 hours. Urine analysis:    Component Value Date/Time   COLORURINE YELLOW 01/18/2023 2052   APPEARANCEUR CLEAR 01/18/2023 2052   LABSPEC 1.015 01/18/2023 2052   PHURINE 5.0 01/18/2023 2052   GLUCOSEU NEGATIVE 01/18/2023 2052   HGBUR NEGATIVE 01/18/2023 2052   BILIRUBINUR NEGATIVE 01/18/2023 2052   KETONESUR NEGATIVE 01/18/2023 2052   PROTEINUR NEGATIVE 01/18/2023 2052   NITRITE NEGATIVE 01/18/2023 2052   LEUKOCYTESUR NEGATIVE 01/18/2023 2052    Radiological Exams on Admission: CT ANGIO GI BLEED  Result Date: 01/18/2023 CLINICAL DATA:  Left lower quadrant abdominal pain.  Bloody stools. EXAM: CTA ABDOMEN AND PELVIS WITHOUT AND WITH CONTRAST TECHNIQUE: Multidetector CT imaging of the abdomen and pelvis was performed using the standard protocol during bolus administration of intravenous contrast. Multiplanar reconstructed images and MIPs were obtained and reviewed to evaluate the vascular anatomy. RADIATION DOSE REDUCTION: This exam was performed according to the departmental dose-optimization program which includes automated exposure control, adjustment of the mA and/or kV according to patient size and/or use of iterative reconstruction technique. CONTRAST:  OMNIPAQUE IOHEXOL 350 MG/ML SOLN COMPARISON:  None Available. FINDINGS: Evaluation of this exam is limited due to respiratory motion artifact. VASCULAR Aorta: Moderate  atherosclerotic calcification. No aneurysmal dilatation or dissection. No periaortic fluid collection. Celiac: The celiac trunk and its major branches are patent. SMA: The SMA is patent. Renals: The renal arteries are patent. IMA: The IMA is patent. Inflow: Mild atherosclerotic calcification of the iliac arteries. The iliac arteries are patent. No S1 dilatation or dissection. Proximal Outflow: The visualized proximal outflow is patent. Veins: The visualized iliac veins and IVC are unremarkable. The SMV, splenic vein, and main portal vein are patent. No portal venous gas. Review of the MIP images confirms the above findings. NON-VASCULAR Lower chest: The visualized lung bases are  clear. No intra-abdominal free air or free fluid. Hepatobiliary: Cirrhosis. There is mild periportal edema. Cholecystectomy. Pancreas: There is inflammatory changes of the upper abdomen surrounding the pancreas and duodenal C-loop suspicious for acute pancreatitis. Correlation with pancreatic enzymes recommended. No drainable fluid collection/abscess or pseudocyst. No dilatation of the main pancreatic duct or gland atrophy. Spleen: Normal in size without focal abnormality. Adrenals/Urinary Tract: The adrenal glands unremarkable. There is no hydronephrosis or nephrolithiasis on either side. Subcentimeter right renal hypodense lesions are too small to characterize, likely cysts. No imaging follow-up. There is symmetric enhancement of the kidneys. The visualized ureters and urinary bladder appear unremarkable. Stomach/Bowel: Inflammatory changes surrounding the duodenal C-loop likely related to acute pancreatitis. Duodenitis as the cause of the inflammatory process is less likely. There is sigmoid diverticulosis without active inflammatory changes. There is no bowel obstruction. No CT findings of active GI bleed. The appendix is normal. Lymphatic: Mildly enlarged peripancreatic lymph nodes, likely reactive. Reproductive: The prostate and  seminal vesicles are grossly unremarkable. Other: None Musculoskeletal: Degenerative changes of the spine. No acute osseous pathology. IMPRESSION: 1. No CT findings of active GI bleed. 2. Findings suspicious for acute pancreatitis. Correlation with pancreatic enzymes recommended. 3. Cirrhosis. 4. Sigmoid diverticulosis. No bowel obstruction. Normal appendix. Electronically Signed   By: Elgie Collard M.D.   On: 01/18/2023 22:14   CT Head Wo Contrast  Result Date: 01/18/2023 CLINICAL DATA:  Head and neck trauma EXAM: CT HEAD WITHOUT CONTRAST CT CERVICAL SPINE WITHOUT CONTRAST TECHNIQUE: Multidetector CT imaging of the head and cervical spine was performed following the standard protocol without intravenous contrast. Multiplanar CT image reconstructions of the cervical spine were also generated. RADIATION DOSE REDUCTION: This exam was performed according to the departmental dose-optimization program which includes automated exposure control, adjustment of the mA and/or kV according to patient size and/or use of iterative reconstruction technique. COMPARISON:  None Available. FINDINGS: CT HEAD FINDINGS Brain: No acute territorial infarction, hemorrhage or intracranial mass. Moderate white matter hypodensity consistent with chronic small vessel ischemic change. Nonenlarged ventricles Vascular: No hyperdense vessels. Vertebral and carotid vascular calcification Skull: Normal. Negative for fracture or focal lesion. Sinuses/Orbits: Opacified left maxillary sinus with scattered calcifications Other: None CT CERVICAL SPINE FINDINGS Alignment: Reversal of cervical lordosis. 3 mm anterolisthesis C3 on C4 and C4 on C5. This is likely due to advanced posterior facet degenerative changes. Facet alignment within normal limits. Skull base and vertebrae: No acute fracture. No primary bone lesion or focal pathologic process. Soft tissues and spinal canal: No prevertebral fluid or swelling. No visible canal hematoma. Disc  levels: Multilevel degenerative changes. Partial ankylosis C5-C6. Moderate disc space narrowing C4-C5, advanced disc space narrowing C6-C7 and C7-T1. Hypertrophic facet degenerative changes at multiple levels with foraminal stenosis. Upper chest: Negative. Other: None IMPRESSION: 1. No CT evidence for acute intracranial abnormality. 2. Moderate chronic small vessel ischemic changes of the white matter. 3. Reversal of cervical lordosis and multilevel degenerative changes. No acute fracture Electronically Signed   By: Jasmine Pang M.D.   On: 01/18/2023 19:45   CT Cervical Spine Wo Contrast  Result Date: 01/18/2023 CLINICAL DATA:  Head and neck trauma EXAM: CT HEAD WITHOUT CONTRAST CT CERVICAL SPINE WITHOUT CONTRAST TECHNIQUE: Multidetector CT imaging of the head and cervical spine was performed following the standard protocol without intravenous contrast. Multiplanar CT image reconstructions of the cervical spine were also generated. RADIATION DOSE REDUCTION: This exam was performed according to the departmental dose-optimization program which includes automated exposure control, adjustment of  the mA and/or kV according to patient size and/or use of iterative reconstruction technique. COMPARISON:  None Available. FINDINGS: CT HEAD FINDINGS Brain: No acute territorial infarction, hemorrhage or intracranial mass. Moderate white matter hypodensity consistent with chronic small vessel ischemic change. Nonenlarged ventricles Vascular: No hyperdense vessels. Vertebral and carotid vascular calcification Skull: Normal. Negative for fracture or focal lesion. Sinuses/Orbits: Opacified left maxillary sinus with scattered calcifications Other: None CT CERVICAL SPINE FINDINGS Alignment: Reversal of cervical lordosis. 3 mm anterolisthesis C3 on C4 and C4 on C5. This is likely due to advanced posterior facet degenerative changes. Facet alignment within normal limits. Skull base and vertebrae: No acute fracture. No primary bone  lesion or focal pathologic process. Soft tissues and spinal canal: No prevertebral fluid or swelling. No visible canal hematoma. Disc levels: Multilevel degenerative changes. Partial ankylosis C5-C6. Moderate disc space narrowing C4-C5, advanced disc space narrowing C6-C7 and C7-T1. Hypertrophic facet degenerative changes at multiple levels with foraminal stenosis. Upper chest: Negative. Other: None IMPRESSION: 1. No CT evidence for acute intracranial abnormality. 2. Moderate chronic small vessel ischemic changes of the white matter. 3. Reversal of cervical lordosis and multilevel degenerative changes. No acute fracture Electronically Signed   By: Jasmine Pang M.D.   On: 01/18/2023 19:45    EKG: Independently reviewed.   Assessment and Plan: * UGIB (upper gastrointestinal bleed) With hematemesis last night, in patient on coumadin for mechanical HV. CT scan GIB neg for active GIB for the moment Stomach ulcer? N/V from acute pancreatitis causing Mallory-Weis? NPO Transfuse as per ABLA below EDP d/w Dr. Meridee Score: GI will see in AM PPI GTT Sounds like next step likely EGD.  ABLA (acute blood loss anemia) Due to UGIB: 3u PRBC transfusion ordered H/H Q6H to start after transfusions Tele monitor  Chronic a-fib (HCC) Coumadin on hold Coreg also on hold for the moment Tele monitor Start short acting either PRN metoprolol or cardizem gtt if rate starts to elevate.  Acute pancreatitis Question of acute pancreatitis. Being called on imaging study; however, Lipase normal, only having minimal epigastric discomfort. Will go ahead and work this up further, though not entirely convinced he has acute pancreatitis today No EtOH intake H/o cholecystectomy in past May want to MRCP for retained stone, but will put this on hold for the moment as his GIB is the most pressing issue Retained stone seems a bit less likely though with normal LFTs Repeat CMP in AM Check triglycerides Pt already NPO for  the GIB. Already getting large volume of fluids in the form of 3U PRBC transfusion.  HEART VALVE REPLACEMENT, HX OF [employer] mechanical AVR. Mitral valve annuloplasty repair (doesn't seem to be an MV replacement based on the 2010 echos) Discussed bleeding with cards, per their recs: Holding coumadin, but no Vit K or other reversal agent No heparin gtt at the moment. Presumably start heparin gtt tomorrow as soon as EGD done and we can feel confident he likely wont start bleeding again. 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 Check ammonia level with next blood draw to make sure its not mild hepatic encephalopathy given appearance of liver on imaging today.  Cirrhosis (HCC) Cirrhotic  appearance of liver on imaging. If he has cirrhosis he's either compensated or mostly compensated with just MCI. LFTs nl T.Bili nl INR elevated -> but he's on coumadin Ammonia level pending. Likely will need further work up and follow up with GI in future, but deferring much of this for now while we focus on his life threatening GI bleed  Essential hypertension Hold home BP meds for the moment given presentation with acute GIB.      Advance Care Planning:   Code Status: Full Code Confirmed with pt and family  Consults: GI and Cards  Family Communication: Wife at bedside, also daughter on phone  Severity of Illness: The appropriate patient status for this patient is INPATIENT. Inpatient status is judged to be reasonable and necessary in order to provide the required intensity of service to ensure the patient's safety. The patient's presenting symptoms, physical exam findings, and  initial radiographic and laboratory data in the context of their chronic comorbidities is felt to place them at high risk for further clinical deterioration. Furthermore, it is not anticipated that the patient will be medically stable for discharge from the hospital within 2 midnights of admission.   * I certify that at the point of admission it is my clinical judgment that the patient will require inpatient hospital care spanning beyond 2 midnights from the point of admission due to high intensity of service, high risk for further deterioration and high frequency of surveillance required.*  Author: Hillary Bow., DO 01/19/2023 12:40 AM  For on call review [email or web address].

## 2023-01-19 NOTE — Progress Notes (Signed)
[facility] PLAN OF CARE NOTE Patient: Lucas Bowman ZOX:[id number]   PCP: Kathlee Nations, MD DOB: 12-18-1950   DOA: 01/18/2023   DOS: 01/19/2023    Patient was admitted by my colleague earlier on 01/19/2023. I have reviewed the H&P as well as assessment and plan and agree with the same. Important changes in the plan are listed below.  Plan of care: Principal Problem:   UGIB (upper gastrointestinal bleed) Active Problems:   ABLA (acute blood loss anemia)   HEART VALVE REPLACEMENT, HX OF   Acute pancreatitis   Chronic a-fib (HCC)   Essential hypertension   Cirrhosis (HCC)   MCI (mild cognitive impairment) Currently reporting some nausea. Somewhat confused and poor historian. Reported some chest pain earlier on and off yesterday. Unable to specify what kind of chest pain.  Mild, lasted for a brief.  And located on the left side. Most likely musculoskeletal. H&H responding well to transfusion. Repeat H&H pending. CBC stable. No evidence of pancreatitis symptomatically and with normal lipase level. Reporting weight loss with chronic nausea. Will check vitamin levels check INR. Goal INR less than 2 for EGD. GI following. Currently on Protonix drip. Also reports chronic neck pain with chronic numbness although currently on examination no sensory loss reported. No focal deficit on examination as well.  Level of care: Progressive Continue progressive care given GI bleed and anemia level as well as multiple other complaints.  Author: Lynden Oxford, MD  [facility] 01/19/2023 10:04 AM   If 7PM-7AM, please contact night-coverage at [email or web address]

## 2023-01-19 NOTE — ED Notes (Signed)
ED TO INPATIENT HANDOFF REPORT  ED Nurse Name and Phone #: Grover Canavan [phone]  S Name/Age/Gender Lucas Bowman 72 y.o. male Room/Bed: 008C/008C  Code Status   Code Status: Full Code  Home/SNF/Other Home Patient oriented to: self, place, time, and situation Is this baseline? Yes   Triage Complete: Triage complete  Chief Complaint UGIB (upper gastrointestinal bleed) [K92.2]  Triage Note Per [facility] pt coming from home c/o nausea, vomiting and diarrhea x 5 days. Pt also reports intermittent bloody stools with generalized abdominal pain. Reports falling yesterday- having head and neck pain. On coumadin.    Allergies Allergies  Allergen Reactions   Other     Pneumonia shot-redness, swelling   Sulfonamide Derivatives Other (See Comments)    Level of Care/Admitting Diagnosis ED Disposition     ED Disposition  Admit   Condition  --   Comment  Hospital Area: [facility] [[id number]]  Level of Care: Progressive [102]  Admit to Progressive based on following criteria: GI, ENDOCRINE disease patients with GI bleeding, acute liver failure or pancreatitis, stable with diabetic ketoacidosis or thyrotoxicosis (hypothyroid) state.  May admit patient to [facility] or [facility] if equivalent level of care is available:: No  Covid Evaluation: Asymptomatic - no recent exposure (last 10 days) testing not required  Diagnosis: UGIB (upper gastrointestinal bleed) [696295]  Admitting Physician: Hillary Bow [[id number]]  Attending Physician: Hillary Bow [phone]  Certification:: I certify this patient will need inpatient services for at least 2 midnights  Estimated Length of Stay: 5          B Medical/Surgery History Past Medical History:  Diagnosis Date   Anemia    Depression    Gastritis    GERD (gastroesophageal reflux disease)    Gout    H/O aortic valve replacement    Hemorrhoids    Hiatal hernia    Internal hemorrhoids    Irritable bowel syndrome     Mitral insufficiency    Neck pain    Septic shock (HCC) 03/06/2009   Tubular adenoma    Past Surgical History:  Procedure Laterality Date   BIOPSY N/A 04/04/2014   Procedure: GASTRIC BIOPSIES;  Surgeon: Corbin Ade, MD;  Location: [facility];  Service: Endoscopy;  Laterality: N/A;   COLONOSCOPY  01/28/2009   RMR: Internal hemorrhoids, otherwise normal rectum/Sigmoid polyp, status post hot snare polypectomy.ADENOMATOUS POLYP    COLONOSCOPY  02/17/2006   Dr.Rourk-Normal rectum.  Left colon polyps= tubular adenoma,Remainder of the colonic mucosa appeared normal.    COLONOSCOPY WITH PROPOFOL N/A 04/04/2014   Procedure: COLONOSCOPY WITH PROPOFOL;  Surgeon: Corbin Ade, MD;  Location: [facility];  Service: Endoscopy;  Laterality: N/A;  1018 in cecum, total withdrawal time:   CORONARY ANGIOPLASTY     ESOPHAGOGASTRODUODENOSCOPY  01/28/2009   WNU:UVOZ distal esophageal erosions consistent with mild erosive reflux esophagitis/ Small hiatal hernia otherwise.  Upper GI tract appeared normal.   ESOPHAGOGASTRODUODENOSCOPY  02/17/2006   Dr.Rourk-Normal esophagus, small hiatal hernia, otherwise normal stomach, D1 and D2   ESOPHAGOGASTRODUODENOSCOPY (EGD) WITH PROPOFOL N/A 04/04/2014   Procedure: ESOPHAGOGASTRODUODENOSCOPY (EGD) WITH PROPOFOL;  Surgeon: Corbin Ade, MD;  Location: [facility];  Service: Endoscopy;  Laterality: N/A;   GALLBLADDER SURGERY     MALONEY DILATION N/A 04/04/2014   Procedure: Elease Hashimoto DILATION;  Surgeon: Corbin Ade, MD;  Location: [facility];  Service: Endoscopy;  Laterality: N/A;  #56 maloney dilator   POLYPECTOMY N/A 04/04/2014   Procedure: POLYPECTOMY;  Surgeon: Corbin Ade, MD;  Location: [facility];  Service: Endoscopy;  Laterality: N/A;     A IV Location/Drains/Wounds Patient Lines/Drains/Airways Status     Active Line/Drains/Airways     Name Placement date Placement time Site Days   Peripheral IV 01/18/23 20 G 1" Left Antecubital 01/18/23  1845  Antecubital  1   Peripheral  IV 01/18/23 18 G Right Antecubital 01/18/23  2014  Antecubital  1   Peripheral IV 01/18/23 22 G Left Forearm 01/18/23  2347  Forearm  1            Intake/Output Last 24 hours  Intake/Output Summary (Last 24 hours) at 01/19/2023 1647 Last data filed at 01/19/2023 2956 Gross per 24 hour  Intake 1320 ml  Output --  Net 1320 ml    Labs/Imaging Results for orders placed or performed during the hospital encounter of 01/18/23 (from the past 48 hour(s))  CBC with Differential     Status: Abnormal   Collection Time: 01/18/23  6:45 PM  Result Value Ref Range   WBC 7.2 4.0 - 10.5 K/uL   RBC 1.79 (L) 4.22 - 5.81 MIL/uL   Hemoglobin 4.9 (LL) 13.0 - 17.0 g/dL    Comment: REPEATED TO VERIFY CRITICAL RESULT CALLED TO, READ BACK BY AND VERIFIED WITH: NOTIFIED FTollie Pizza, RN @ 1921 01/18/23 BY SEKDAHL    HCT 15.5 (L) 39.0 - 52.0 %   MCV 86.6 80.0 - 100.0 fL   MCH 27.4 26.0 - 34.0 pg   MCHC 31.6 30.0 - 36.0 g/dL   RDW 21.3 (H) 08.6 - 57.8 %   Platelets 198 150 - 400 K/uL    Comment: REPEATED TO VERIFY   nRBC 0 0.0 - 0.2 %   Neutrophils Relative % 66 %   Neutro Abs 4.7 1.7 - 7.7 K/uL   Lymphocytes Relative 26 %   Lymphs Abs 1.9 0.7 - 4.0 K/uL   Monocytes Relative 8 %   Monocytes Absolute 0.6 0.1 - 1.0 K/uL   Eosinophils Relative 0 %   Eosinophils Absolute 0.0 0.0 - 0.5 K/uL   Basophils Relative 0 %   Basophils Absolute 0.0 0.0 - 0.1 K/uL   nRBC 0 0 /100 WBC   Immature Granulocytes 0 %   Abs Immature Granulocytes 0.00 0.00 - 0.07 K/uL    Comment: Performed at [facility], [address], [place], [place] [address]  Comprehensive metabolic panel     Status: Abnormal   Collection Time: 01/18/23  6:45 PM  Result Value Ref Range   Sodium 134 (L) 135 - 145 mmol/L   Potassium 4.0 3.5 - 5.1 mmol/L   Chloride 101 98 - 111 mmol/L   CO2 26 22 - 32 mmol/L   Glucose, Bld 108 (H) 70 - 99 mg/dL    Comment: Glucose reference range applies only to samples taken after fasting for at least  8 hours.   BUN 29 (H) 8 - 23 mg/dL   Creatinine, Ser 9.52 0.61 - 1.24 mg/dL   Calcium 8.4 (L) 8.9 - 10.3 mg/dL   Total Protein 5.9 (L) 6.5 - 8.1 g/dL   Albumin 3.4 (L) 3.5 - 5.0 g/dL   AST 22 15 - 41 U/L   ALT 13 0 - 44 U/L   Alkaline Phosphatase 50 38 - 126 U/L   Total Bilirubin 0.2 (L) 0.3 - 1.2 mg/dL   GFR, Estimated >84 >13 mL/min    Comment: (NOTE) Calculated using the CKD-EPI Creatinine Equation (  2021)    Anion gap 7 5 - 15    Comment: Performed at [facility], [address], [place], [place] [address]  Lipase, blood     Status: Abnormal   Collection Time: 01/18/23  6:45 PM  Result Value Ref Range   Lipase 54 (H) 11 - 51 U/L    Comment: Performed at [facility], [address] [place], [place] [address]  APTT     Status: Abnormal   Collection Time: 01/18/23  6:45 PM  Result Value Ref Range   aPTT 40 (H) 24 - 36 seconds    Comment:        IF BASELINE aPTT IS ELEVATED, SUGGEST PATIENT RISK ASSESSMENT BE USED TO DETERMINE APPROPRIATE ANTICOAGULANT THERAPY. Performed at [facility], [address] [place], [place] [address]   Type and screen [facility]     Status: None (Preliminary result)   Collection Time: 01/18/23  6:45 PM  Result Value Ref Range   ABO/RH(D) O POS    Antibody Screen NEG    Sample Expiration 01/21/2023,2359    Unit Number [id number]    Blood Component Type RED CELLS,LR    Unit division 00    Status of Unit ISSUED    Transfusion Status OK TO TRANSFUSE    Crossmatch Result Compatible    Unit Number [id number]    Blood Component Type RBC LR PHER2    Unit division 00    Status of Unit ISSUED,FINAL    Transfusion Status OK TO TRANSFUSE    Crossmatch Result      Compatible Performed at [facility], [address] [place], [place] [address]    Unit Number [id number]    Blood Component Type RED CELLS,LR    Unit division 00    Status of Unit ISSUED    Transfusion Status OK TO TRANSFUSE     Crossmatch Result Compatible   Protime-INR     Status: Abnormal   Collection Time: 01/18/23  6:45 PM  Result Value Ref Range   Prothrombin Time 28.6 (H) 11.4 - 15.2 seconds   INR 2.7 (H) 0.8 - 1.2    Comment: (NOTE) INR goal varies based on device and disease states. Performed at [facility], [address] [place], [place] [address]   I-stat chem 8, ED (not at [facility], [facility] or [facility])     Status: Abnormal   Collection Time: 01/18/23  6:51 PM  Result Value Ref Range   Sodium 136 135 - 145 mmol/L   Potassium 4.1 3.5 - 5.1 mmol/L   Chloride 101 98 - 111 mmol/L   BUN 30 (H) 8 - 23 mg/dL   Creatinine, Ser 5.42 0.61 - 1.24 mg/dL   Glucose, Bld 706 (H) 70 - 99 mg/dL    Comment: Glucose reference range applies only to samples taken after fasting for at least 8 hours.   Calcium, Ion 1.07 (L) 1.15 - 1.40 mmol/L   TCO2 24 22 - 32 mmol/L   Hemoglobin 5.4 (LL) 13.0 - 17.0 g/dL   HCT 23.7 (L) 62.8 - 31.5 %   Comment NOTIFIED PHYSICIAN   Prepare RBC (crossmatch)     Status: None   Collection Time: 01/18/23  7:50 PM  Result Value Ref Range   Order Confirmation      ORDER PROCESSED BY BLOOD BANK Performed at [facility], [address], [place], [place] [address]   POC occult  blood, ED     Status: Abnormal   Collection Time: 01/18/23  8:06 PM  Result Value Ref Range   Fecal Occult Bld POSITIVE (A) NEGATIVE  Urinalysis, Routine w reflex microscopic -Urine, Clean Catch     Status: None   Collection Time: 01/18/23  8:52 PM  Result Value Ref Range   Color, Urine YELLOW YELLOW   APPearance CLEAR CLEAR   Specific Gravity, Urine 1.015 1.005 - 1.030   pH 5.0 5.0 - 8.0   Glucose, UA NEGATIVE NEGATIVE mg/dL   Hgb urine dipstick NEGATIVE NEGATIVE   Bilirubin Urine NEGATIVE NEGATIVE   Ketones, ur NEGATIVE NEGATIVE mg/dL   Protein, ur NEGATIVE NEGATIVE mg/dL   Nitrite NEGATIVE NEGATIVE   Leukocytes,Ua NEGATIVE NEGATIVE    Comment: Performed at [facility], [address],  [place], [place] [address]  Hemoglobin and hematocrit, blood     Status: Abnormal   Collection Time: 01/19/23  3:33 AM  Result Value Ref Range   Hemoglobin 6.5 (LL) 13.0 - 17.0 g/dL    Comment: CRITICAL VALUE NOTED.  VALUE IS CONSISTENT WITH PREVIOUSLY REPORTED AND CALLED VALUE. REPEATED TO VERIFY POST TRANSFUSION SPECIMEN    HCT 19.8 (L) 39.0 - 52.0 %    Comment: Performed at [facility], [address], [place], [place] [address]  Lipase, blood     Status: None   Collection Time: 01/19/23  6:14 AM  Result Value Ref Range   Lipase 44 11 - 51 U/L    Comment: Performed at [facility], [address], [place], [place] [address]  Ammonia     Status: None   Collection Time: 01/19/23  7:30 AM  Result Value Ref Range   Ammonia 17 9 - 35 umol/L    Comment: Performed at [facility], [address], [place], [place] [address]  Comprehensive metabolic panel     Status: Abnormal   Collection Time: 01/19/23  7:30 AM  Result Value Ref Range   Sodium 138 135 - 145 mmol/L   Potassium 3.7 3.5 - 5.1 mmol/L   Chloride 99 98 - 111 mmol/L   CO2 25 22 - 32 mmol/L   Glucose, Bld 108 (H) 70 - 99 mg/dL    Comment: Glucose reference range applies only to samples taken after fasting for at least 8 hours.   BUN 23 8 - 23 mg/dL   Creatinine, Ser 5.78 0.61 - 1.24 mg/dL   Calcium 8.7 (L) 8.9 - 10.3 mg/dL   Total Protein 5.6 (L) 6.5 - 8.1 g/dL   Albumin 3.3 (L) 3.5 - 5.0 g/dL   AST 22 15 - 41 U/L   ALT 14 0 - 44 U/L   Alkaline Phosphatase 46 38 - 126 U/L   Total Bilirubin 0.9 0.3 - 1.2 mg/dL   GFR, Estimated >46 >96 mL/min    Comment: (NOTE) Calculated using the CKD-EPI Creatinine Equation (2021)    Anion gap 14 5 - 15    Comment: Performed at [facility], [address] [place], [place] [address]  Hemoglobin and hematocrit, blood     Status: Abnormal   Collection Time: 01/19/23  1:13 PM  Result Value Ref Range   Hemoglobin 7.7 (L) 13.0 - 17.0 g/dL   HCT 41.3 (L) 24.4 - 01.0  %    Comment: Performed at [facility], [address] [place], [place] [address]  Protime-INR     Status: Abnormal  Collection Time: 01/19/23  1:13 PM  Result Value Ref Range   Prothrombin Time 23.3 (H) 11.4 - 15.2 seconds   INR 2.0 (H) 0.8 - 1.2    Comment: (NOTE) INR goal varies based on device and disease states. Performed at [facility], [address], [place], [place] [address]    CT ANGIO GI BLEED  Result Date: 01/18/2023 CLINICAL DATA:  Left lower quadrant abdominal pain.  Bloody stools. EXAM: CTA ABDOMEN AND PELVIS WITHOUT AND WITH CONTRAST TECHNIQUE: Multidetector CT imaging of the abdomen and pelvis was performed using the standard protocol during bolus administration of intravenous contrast. Multiplanar reconstructed images and MIPs were obtained and reviewed to evaluate the vascular anatomy. RADIATION DOSE REDUCTION: This exam was performed according to the departmental dose-optimization program which includes automated exposure control, adjustment of the mA and/or kV according to patient size and/or use of iterative reconstruction technique. CONTRAST:  OMNIPAQUE IOHEXOL 350 MG/ML SOLN COMPARISON:  None Available. FINDINGS: Evaluation of this exam is limited due to respiratory motion artifact. VASCULAR Aorta: Moderate atherosclerotic calcification. No aneurysmal dilatation or dissection. No periaortic fluid collection. Celiac: The celiac trunk and its major branches are patent. SMA: The SMA is patent. Renals: The renal arteries are patent. IMA: The IMA is patent. Inflow: Mild atherosclerotic calcification of the iliac arteries. The iliac arteries are patent. No S1 dilatation or dissection. Proximal Outflow: The visualized proximal outflow is patent. Veins: The visualized iliac veins and IVC are unremarkable. The SMV, splenic vein, and main portal vein are patent. No portal venous gas. Review of the MIP images confirms the above findings. NON-VASCULAR Lower chest:  The visualized lung bases are clear. No intra-abdominal free air or free fluid. Hepatobiliary: Cirrhosis. There is mild periportal edema. Cholecystectomy. Pancreas: There is inflammatory changes of the upper abdomen surrounding the pancreas and duodenal C-loop suspicious for acute pancreatitis. Correlation with pancreatic enzymes recommended. No drainable fluid collection/abscess or pseudocyst. No dilatation of the main pancreatic duct or gland atrophy. Spleen: Normal in size without focal abnormality. Adrenals/Urinary Tract: The adrenal glands unremarkable. There is no hydronephrosis or nephrolithiasis on either side. Subcentimeter right renal hypodense lesions are too small to characterize, likely cysts. No imaging follow-up. There is symmetric enhancement of the kidneys. The visualized ureters and urinary bladder appear unremarkable. Stomach/Bowel: Inflammatory changes surrounding the duodenal C-loop likely related to acute pancreatitis. Duodenitis as the cause of the inflammatory process is less likely. There is sigmoid diverticulosis without active inflammatory changes. There is no bowel obstruction. No CT findings of active GI bleed. The appendix is normal. Lymphatic: Mildly enlarged peripancreatic lymph nodes, likely reactive. Reproductive: The prostate and seminal vesicles are grossly unremarkable. Other: None Musculoskeletal: Degenerative changes of the spine. No acute osseous pathology. IMPRESSION: 1. No CT findings of active GI bleed. 2. Findings suspicious for acute pancreatitis. Correlation with pancreatic enzymes recommended. 3. Cirrhosis. 4. Sigmoid diverticulosis. No bowel obstruction. Normal appendix. Electronically Signed   By: Elgie Collard M.D.   On: 01/18/2023 22:14   CT Head Wo Contrast  Result Date: 01/18/2023 CLINICAL DATA:  Head and neck trauma EXAM: CT HEAD WITHOUT CONTRAST CT CERVICAL SPINE WITHOUT CONTRAST TECHNIQUE: Multidetector CT imaging of the head and cervical spine was  performed following the standard protocol without intravenous contrast. Multiplanar CT image reconstructions of the cervical spine were also generated. RADIATION DOSE REDUCTION: This exam was performed according to the departmental dose-optimization program which includes automated exposure control, adjustment of the mA and/or kV according to patient  size and/or use of iterative reconstruction technique. COMPARISON:  None Available. FINDINGS: CT HEAD FINDINGS Brain: No acute territorial infarction, hemorrhage or intracranial mass. Moderate white matter hypodensity consistent with chronic small vessel ischemic change. Nonenlarged ventricles Vascular: No hyperdense vessels. Vertebral and carotid vascular calcification Skull: Normal. Negative for fracture or focal lesion. Sinuses/Orbits: Opacified left maxillary sinus with scattered calcifications Other: None CT CERVICAL SPINE FINDINGS Alignment: Reversal of cervical lordosis. 3 mm anterolisthesis C3 on C4 and C4 on C5. This is likely due to advanced posterior facet degenerative changes. Facet alignment within normal limits. Skull base and vertebrae: No acute fracture. No primary bone lesion or focal pathologic process. Soft tissues and spinal canal: No prevertebral fluid or swelling. No visible canal hematoma. Disc levels: Multilevel degenerative changes. Partial ankylosis C5-C6. Moderate disc space narrowing C4-C5, advanced disc space narrowing C6-C7 and C7-T1. Hypertrophic facet degenerative changes at multiple levels with foraminal stenosis. Upper chest: Negative. Other: None IMPRESSION: 1. No CT evidence for acute intracranial abnormality. 2. Moderate chronic small vessel ischemic changes of the white matter. 3. Reversal of cervical lordosis and multilevel degenerative changes. No acute fracture Electronically Signed   By: Jasmine Pang M.D.   On: 01/18/2023 19:45   CT Cervical Spine Wo Contrast  Result Date: 01/18/2023 CLINICAL DATA:  Head and neck trauma  EXAM: CT HEAD WITHOUT CONTRAST CT CERVICAL SPINE WITHOUT CONTRAST TECHNIQUE: Multidetector CT imaging of the head and cervical spine was performed following the standard protocol without intravenous contrast. Multiplanar CT image reconstructions of the cervical spine were also generated. RADIATION DOSE REDUCTION: This exam was performed according to the departmental dose-optimization program which includes automated exposure control, adjustment of the mA and/or kV according to patient size and/or use of iterative reconstruction technique. COMPARISON:  None Available. FINDINGS: CT HEAD FINDINGS Brain: No acute territorial infarction, hemorrhage or intracranial mass. Moderate white matter hypodensity consistent with chronic small vessel ischemic change. Nonenlarged ventricles Vascular: No hyperdense vessels. Vertebral and carotid vascular calcification Skull: Normal. Negative for fracture or focal lesion. Sinuses/Orbits: Opacified left maxillary sinus with scattered calcifications Other: None CT CERVICAL SPINE FINDINGS Alignment: Reversal of cervical lordosis. 3 mm anterolisthesis C3 on C4 and C4 on C5. This is likely due to advanced posterior facet degenerative changes. Facet alignment within normal limits. Skull base and vertebrae: No acute fracture. No primary bone lesion or focal pathologic process. Soft tissues and spinal canal: No prevertebral fluid or swelling. No visible canal hematoma. Disc levels: Multilevel degenerative changes. Partial ankylosis C5-C6. Moderate disc space narrowing C4-C5, advanced disc space narrowing C6-C7 and C7-T1. Hypertrophic facet degenerative changes at multiple levels with foraminal stenosis. Upper chest: Negative. Other: None IMPRESSION: 1. No CT evidence for acute intracranial abnormality. 2. Moderate chronic small vessel ischemic changes of the white matter. 3. Reversal of cervical lordosis and multilevel degenerative changes. No acute fracture Electronically Signed   By: Jasmine Pang M.D.   On: 01/18/2023 19:45    Pending Labs Unresulted Labs (From admission, onward)     Start     Ordered   01/19/23 0800  Hemoglobin and hematocrit, blood  Once,   TIMED        01/19/23 0800   01/19/23 0014  Hemoglobin and hematocrit, blood  Now then every 6 hours,   R (with TIMED occurrences)      01/19/23 0016            Vitals/Pain Today's Vitals   01/19/23 1300 01/19/23 1308 01/19/23 1440 01/19/23 1445  BP:  120/71   117/81  Pulse: 90   95  Resp: 13   17  Temp:  98.1 F (36.7 C)    TempSrc:  Oral    SpO2: 97%   100%  Weight:      Height:      PainSc:   8      Isolation Precautions No active isolations  Medications Medications  pantoprozole (PROTONIX) 80 mg /NS 100 mL infusion (8 mg/hr Intravenous New Bag/Given 01/19/23 1617)  acetaminophen (TYLENOL) tablet 650 mg (has no administration in time range)    Or  acetaminophen (TYLENOL) suppository 650 mg (has no administration in time range)  ondansetron (ZOFRAN) tablet 4 mg (has no administration in time range)    Or  ondansetron (ZOFRAN) injection 4 mg (has no administration in time range)  traZODone (DESYREL) tablet 100 mg (100 mg Oral Given 01/19/23 0131)  multivitamin with minerals tablet 1 tablet (0 tablets Oral Hold 01/19/23 0930)  loratadine (CLARITIN) tablet 10 mg (0 mg Oral Hold 01/19/23 1013)  HYDROcodone-acetaminophen (NORCO) 10-325 MG per tablet 1 tablet (1 tablet Oral Given 01/19/23 1440)  DULoxetine (CYMBALTA) DR capsule 30 mg (0 mg Oral Hold 01/19/23 1014)  cyclobenzaprine (FLEXERIL) tablet 10 mg (10 mg Oral Given 01/19/23 0131)  albuterol (PROVENTIL) (2.5 MG/3ML) 0.083% nebulizer solution 3 mL (has no administration in time range)  allopurinol (ZYLOPRIM) tablet 300 mg (300 mg Oral Given 01/19/23 0928)  pantoprazole (PROTONIX) 80 mg /NS 100 mL IVPB (0 mg Intravenous Stopped 01/18/23 2127)  0.9 %  sodium chloride infusion (Manually program via Guardrails IV Fluids) (0 mLs Intravenous Stopped 01/19/23  0718)  HYDROcodone-acetaminophen (NORCO/VICODIN) 5-325 MG per tablet 1 tablet (1 tablet Oral Given 01/18/23 2212)  iohexol (OMNIPAQUE) 350 MG/ML injection 100 mL (100 mLs Intravenous Contrast Given 01/18/23 2153)  calcium gluconate 1 g/ 50 mL sodium chloride IVPB (0 mg Intravenous Stopped 01/19/23 0057)  cefTRIAXone (ROCEPHIN) 1 g in sodium chloride 0.9 % 100 mL IVPB (0 g Intravenous Stopped 01/19/23 0017)  HYDROcodone-acetaminophen (NORCO/VICODIN) 5-325 MG per tablet 1 tablet (1 tablet Oral Given 01/19/23 0131)    Mobility walks with person assist     Focused Assessments Cardiac Assessment Handoff:  Cardiac Rhythm: Atrial fibrillation Lab Results  Component Value Date   CKTOTAL 50 02/20/2009   CKMB 1.9 02/20/2009   TROPONINI 0.02        NO INDICATION OF MYOCARDIAL INJURY. 02/20/2009   Lab Results  Component Value Date   DDIMER (H) 02/19/2009    1.62        AT THE INHOUSE ESTABLISHED CUTOFF VALUE OF 0.48 ug/mL FEU, THIS ASSAY HAS BEEN DOCUMENTED IN THE LITERATURE TO HAVE A SENSITIVITY AND NEGATIVE PREDICTIVE VALUE OF AT LEAST 98 TO 99%.  THE TEST RESULT SHOULD BE CORRELATED WITH AN ASSESSMENT OF THE CLINICAL PROBABILITY OF DVT / VTE.   Does the Patient currently have chest pain? No    R Recommendations: See Admitting Provider Note  Report given to:   Additional Notes:

## 2023-01-19 NOTE — Consult Note (Addendum)
Cardiology Consultation   Patient ID: Lucas Bowman MRN: [id number]; DOB: Aug 05, 1950  Admit date: 01/18/2023 Date of Consult: 01/19/2023  PCP:  Kathlee Nations, MD   [facility] Providers Cardiologist:  None        Patient Profile:   Lucas Bowman is a 72 y.o. male with permanent atrial fibrillation, history of mechanical aortic valve replacement (on chronic warfarin), history of ascending aortic aneurysm s/p mesh repair, mitral valve repair, HTN, HLD, diverticulosis, and GERD/gastritis who is being seen 01/19/2023 for the evaluation of anticoagulation management in the setting of acute GIB at the request of internal medicine.  History of Present Illness:   According to the chart, the patient presented to the emergency room with acute onset hematemesis and several days of persistent maroon-colored stools. He has also noticed some associated abdominal pain, as well as presyncopal symptoms and fatigue.   He presented to the ED where his initial hemoglobin was 4.9.    Past Medical History:  Diagnosis Date   Anemia    Depression    Gastritis    GERD (gastroesophageal reflux disease)    Gout    H/O aortic valve replacement    Hemorrhoids    Hiatal hernia    Internal hemorrhoids    Irritable bowel syndrome    Mitral insufficiency    Neck pain    Septic shock (HCC) 03/06/2009   Tubular adenoma     Past Surgical History:  Procedure Laterality Date   BIOPSY N/A 04/04/2014   Procedure: GASTRIC BIOPSIES;  Surgeon: Corbin Ade, MD;  Location: [facility];  Service: Endoscopy;  Laterality: N/A;   COLONOSCOPY  01/28/2009   RMR: Internal hemorrhoids, otherwise normal rectum/Sigmoid polyp, status post hot snare polypectomy.ADENOMATOUS POLYP    COLONOSCOPY  02/17/2006   Dr.Rourk-Normal rectum.  Left colon polyps= tubular adenoma,Remainder of the colonic mucosa appeared normal.    COLONOSCOPY WITH PROPOFOL N/A 04/04/2014   Procedure: COLONOSCOPY WITH PROPOFOL;  Surgeon: Corbin Ade, MD;  Location: [facility];  Service: Endoscopy;  Laterality: N/A;  1018 in cecum, total withdrawal time:   CORONARY ANGIOPLASTY     ESOPHAGOGASTRODUODENOSCOPY  01/28/2009   WUJ:WJXB distal esophageal erosions consistent with mild erosive reflux esophagitis/ Small hiatal hernia otherwise.  Upper GI tract appeared normal.   ESOPHAGOGASTRODUODENOSCOPY  02/17/2006   Dr.Rourk-Normal esophagus, small hiatal hernia, otherwise normal stomach, D1 and D2   ESOPHAGOGASTRODUODENOSCOPY (EGD) WITH PROPOFOL N/A 04/04/2014   Procedure: ESOPHAGOGASTRODUODENOSCOPY (EGD) WITH PROPOFOL;  Surgeon: Corbin Ade, MD;  Location: [facility];  Service: Endoscopy;  Laterality: N/A;   GALLBLADDER SURGERY     MALONEY DILATION N/A 04/04/2014   Procedure: Elease Hashimoto DILATION;  Surgeon: Corbin Ade, MD;  Location: [facility];  Service: Endoscopy;  Laterality: N/A;  #56 maloney dilator   POLYPECTOMY N/A 04/04/2014   Procedure: POLYPECTOMY;  Surgeon: Corbin Ade, MD;  Location: [facility];  Service: Endoscopy;  Laterality: N/A;     Home Medications:  Prior to Admission medications   Medication Sig Start Date End Date Taking? Authorizing Provider  albuterol (VENTOLIN HFA) 108 (90 Base) MCG/ACT inhaler Inhale 1-2 puffs into the lungs 2 (two) times daily as needed for wheezing or shortness of breath. 04/24/19  Yes [provider]  allopurinol (ZYLOPRIM) 300 MG tablet Take 300 mg by mouth daily.  01/28/14  Yes [provider]  benazepril (LOTENSIN) 5 MG tablet Take 5 mg by mouth at bedtime.   Yes [provider]  carvedilol (COREG) 6.25 MG tablet Take 6.25 mg by mouth 2 (two) times daily with a meal. 02/12/14  Yes [provider]  Cholecalciferol (VITAMIN D) 50 MCG (2000 UT) CAPS Take 1 capsule by mouth daily.   Yes [provider]  cyclobenzaprine (FLEXERIL) 10 MG tablet Take 10 mg by mouth at bedtime as needed for muscle spasms. 04/10/19  Yes [provider]  dicyclomine  (BENTYL) 10 MG capsule Take 10 mg by mouth daily as needed for spasms.   Yes [provider]  DULoxetine (CYMBALTA) 30 MG capsule Take 30 mg by mouth daily.   Yes [provider]  fluticasone (FLONASE) 50 MCG/ACT nasal spray Place 2 sprays into both nostrils daily.   Yes [provider]  furosemide (LASIX) 20 MG tablet Take 20 mg by mouth daily. 02/12/14  Yes [provider]  gemfibrozil (LOPID) 600 MG tablet Take 600 mg by mouth daily.  02/04/14  Yes [provider]  HYDROcodone-acetaminophen (NORCO) 10-325 MG per tablet Take 1 tablet by mouth every 6 (six) hours as needed for moderate pain.  02/18/14  Yes [provider]  loratadine (CLARITIN) 10 MG tablet Take 10 mg by mouth daily.   Yes [provider]  Multiple Vitamin (MULTIVITAMIN) tablet Take 1 tablet by mouth daily.   Yes [provider]  naloxone (NARCAN) nasal spray 4 mg/0.1 mL Place 1 spray into the nose as needed.   Yes [provider]  nystatin cream (MYCOSTATIN) Apply 1 application  topically 2 (two) times daily as needed for dry skin.   Yes [provider]  ondansetron (ZOFRAN-ODT) 4 MG disintegrating tablet Take 4 mg by mouth every 8 (eight) hours as needed for nausea or vomiting.  02/05/14  Yes [provider]  pantoprazole (PROTONIX) 40 MG tablet Take 40 mg by mouth daily.  01/28/14  Yes [provider]  traZODone (DESYREL) 100 MG tablet Take 100 mg by mouth at bedtime. 04/21/19  Yes [provider]  warfarin (COUMADIN) 5 MG tablet Take 5 mg by mouth See admin instructions. Except for tues and thurs - takes 2.5mg . 12/17/13  Yes [provider]  polyethylene glycol-electrolytes (TRILYTE) 420 g solution Take 4,000 mLs by mouth as directed. Patient not taking: Reported on 01/18/2023 03/08/19   Corbin Ade, MD    Inpatient Medications: Scheduled Meds:  Continuous Infusions:  calcium gluconate 1,000 mg (01/18/23  2350)   pantoprazole 8 mg/hr (01/18/23 2101)   PRN Meds: acetaminophen **OR** acetaminophen, ondansetron **OR** ondansetron (ZOFRAN) IV  Allergies:    Allergies  Allergen Reactions   Other     Pneumonia shot-redness, swelling   Sulfonamide Derivatives Other (See Comments)    Social History:   Social History   Socioeconomic History   Marital status: Married    Spouse name: Not on file   Number of children: Not on file   Years of education: Not on file   Highest education level: Not on file  Occupational History   Not on file  Tobacco Use   Smoking status: Never   Smokeless tobacco: Never  Substance and Sexual Activity   Alcohol use: No   Drug use: No   Sexual activity: Not on file  Other Topics Concern   Not on file  Social History Narrative   Not on file   Social Determinants of Health   Financial Resource Strain: Not on file  Food Insecurity: Not on file  Transportation Needs: Not on file  Physical Activity:  Not on file  Stress: Not on file  Social Connections: Not on file  Intimate Partner Violence: Not on file    Family History:   History reviewed. No pertinent family history.   ROS:  Please see the history of present illness.   All other ROS reviewed and negative.     Physical Exam/Data:   Vitals:   01/18/23 2315 01/18/23 2345 01/19/23 0004 01/19/23 0036  BP: 119/67 122/69 120/89 123/72  Pulse: 95 (!) 106 95 72  Resp: (!) 24 (!) 21 20 20   Temp:   98.5 F (36.9 C) 98.1 F (36.7 C)  TempSrc:   Oral Oral  SpO2: 100% 100% 100% 100%  Weight:      Height:        Intake/Output Summary (Last 24 hours) at 01/19/2023 0045 Last data filed at 01/19/2023 0017 Gross per 24 hour  Intake 520 ml  Output --  Net 520 ml      01/18/2023    6:33 PM 03/08/2019    2:38 PM 03/28/2014   12:49 PM  Last 3 Weights  Weight (lbs) 170 lb 172 lb 9.6 oz 198 lb  Weight (kg) 77.111 kg 78.291 kg 89.812 kg     Body mass index is 25.85 kg/m.  General:  Well  nourished, well developed, in no acute distress HEENT: normal Neck: no JVD Cardiac:  normal S1, S2; RRR; mechanical click noted Lungs:  clear to auscultation bilaterally, no wheezing, rhonchi or rales  Abd: soft, mildly tender, not distended Ext: no edema Skin: warm and dry  Neuro:  No focal abnormalities noted Psych:  Normal affect    Relevant CV Studies:   Laboratory Data:  High Sensitivity Troponin:  No results for input(s): "TROPONINIHS" in the last 720 hours.   Chemistry Recent Labs  Lab 01/18/23 1845 01/18/23 1851  NA 134* 136  K 4.0 4.1  CL 101 101  CO2 26  --   GLUCOSE 108* 104*  BUN 29* 30*  CREATININE 1.11 1.20  CALCIUM 8.4*  --   GFRNONAA >60  --   ANIONGAP 7  --     Recent Labs  Lab 01/18/23 1845  PROT 5.9*  ALBUMIN 3.4*  AST 22  ALT 13  ALKPHOS 50  BILITOT 0.2*   Lipids No results for input(s): "CHOL", "TRIG", "HDL", "LABVLDL", "LDLCALC", "CHOLHDL" in the last 168 hours.  Hematology Recent Labs  Lab 01/18/23 1845 01/18/23 1851  WBC 7.2  --   RBC 1.79*  --   HGB 4.9* 5.4*  HCT 15.5* 16.0*  MCV 86.6  --   MCH 27.4  --   MCHC 31.6  --   RDW 16.1*  --   PLT 198  --    Thyroid No results for input(s): "TSH", "FREET4" in the last 168 hours.  BNPNo results for input(s): "BNP", "PROBNP" in the last 168 hours.  DDimer No results for input(s): "DDIMER" in the last 168 hours.   Radiology/Studies:  CT ANGIO GI BLEED  Result Date: 01/18/2023 CLINICAL DATA:  Left lower quadrant abdominal pain.  Bloody stools. EXAM: CTA ABDOMEN AND PELVIS WITHOUT AND WITH CONTRAST TECHNIQUE: Multidetector CT imaging of the abdomen and pelvis was performed using the standard protocol during bolus administration of intravenous contrast. Multiplanar reconstructed images and MIPs were obtained and reviewed to evaluate the vascular anatomy. RADIATION DOSE REDUCTION: This exam was performed according to the departmental dose-optimization program which includes automated  exposure control, adjustment of the mA and/or kV according to  patient size and/or use of iterative reconstruction technique. CONTRAST:  OMNIPAQUE IOHEXOL 350 MG/ML SOLN COMPARISON:  None Available. FINDINGS: Evaluation of this exam is limited due to respiratory motion artifact. VASCULAR Aorta: Moderate atherosclerotic calcification. No aneurysmal dilatation or dissection. No periaortic fluid collection. Celiac: The celiac trunk and its major branches are patent. SMA: The SMA is patent. Renals: The renal arteries are patent. IMA: The IMA is patent. Inflow: Mild atherosclerotic calcification of the iliac arteries. The iliac arteries are patent. No S1 dilatation or dissection. Proximal Outflow: The visualized proximal outflow is patent. Veins: The visualized iliac veins and IVC are unremarkable. The SMV, splenic vein, and main portal vein are patent. No portal venous gas. Review of the MIP images confirms the above findings. NON-VASCULAR Lower chest: The visualized lung bases are clear. No intra-abdominal free air or free fluid. Hepatobiliary: Cirrhosis. There is mild periportal edema. Cholecystectomy. Pancreas: There is inflammatory changes of the upper abdomen surrounding the pancreas and duodenal C-loop suspicious for acute pancreatitis. Correlation with pancreatic enzymes recommended. No drainable fluid collection/abscess or pseudocyst. No dilatation of the main pancreatic duct or gland atrophy. Spleen: Normal in size without focal abnormality. Adrenals/Urinary Tract: The adrenal glands unremarkable. There is no hydronephrosis or nephrolithiasis on either side. Subcentimeter right renal hypodense lesions are too small to characterize, likely cysts. No imaging follow-up. There is symmetric enhancement of the kidneys. The visualized ureters and urinary bladder appear unremarkable. Stomach/Bowel: Inflammatory changes surrounding the duodenal C-loop likely related to acute pancreatitis. Duodenitis as the cause of  the inflammatory process is less likely. There is sigmoid diverticulosis without active inflammatory changes. There is no bowel obstruction. No CT findings of active GI bleed. The appendix is normal. Lymphatic: Mildly enlarged peripancreatic lymph nodes, likely reactive. Reproductive: The prostate and seminal vesicles are grossly unremarkable. Other: None Musculoskeletal: Degenerative changes of the spine. No acute osseous pathology. IMPRESSION: 1. No CT findings of active GI bleed. 2. Findings suspicious for acute pancreatitis. Correlation with pancreatic enzymes recommended. 3. Cirrhosis. 4. Sigmoid diverticulosis. No bowel obstruction. Normal appendix. Electronically Signed   By: Elgie Collard M.D.   On: 01/18/2023 22:14   CT Head Wo Contrast  Result Date: 01/18/2023 CLINICAL DATA:  Head and neck trauma EXAM: CT HEAD WITHOUT CONTRAST CT CERVICAL SPINE WITHOUT CONTRAST TECHNIQUE: Multidetector CT imaging of the head and cervical spine was performed following the standard protocol without intravenous contrast. Multiplanar CT image reconstructions of the cervical spine were also generated. RADIATION DOSE REDUCTION: This exam was performed according to the departmental dose-optimization program which includes automated exposure control, adjustment of the mA and/or kV according to patient size and/or use of iterative reconstruction technique. COMPARISON:  None Available. FINDINGS: CT HEAD FINDINGS Brain: No acute territorial infarction, hemorrhage or intracranial mass. Moderate white matter hypodensity consistent with chronic small vessel ischemic change. Nonenlarged ventricles Vascular: No hyperdense vessels. Vertebral and carotid vascular calcification Skull: Normal. Negative for fracture or focal lesion. Sinuses/Orbits: Opacified left maxillary sinus with scattered calcifications Other: None CT CERVICAL SPINE FINDINGS Alignment: Reversal of cervical lordosis. 3 mm anterolisthesis C3 on C4 and C4 on C5. This  is likely due to advanced posterior facet degenerative changes. Facet alignment within normal limits. Skull base and vertebrae: No acute fracture. No primary bone lesion or focal pathologic process. Soft tissues and spinal canal: No prevertebral fluid or swelling. No visible canal hematoma. Disc levels: Multilevel degenerative changes. Partial ankylosis C5-C6. Moderate disc space narrowing C4-C5, advanced disc space narrowing C6-C7 and C7-T1. Hypertrophic  facet degenerative changes at multiple levels with foraminal stenosis. Upper chest: Negative. Other: None IMPRESSION: 1. No CT evidence for acute intracranial abnormality. 2. Moderate chronic small vessel ischemic changes of the white matter. 3. Reversal of cervical lordosis and multilevel degenerative changes. No acute fracture Electronically Signed   By: Jasmine Pang M.D.   On: 01/18/2023 19:45   CT Cervical Spine Wo Contrast  Result Date: 01/18/2023 CLINICAL DATA:  Head and neck trauma EXAM: CT HEAD WITHOUT CONTRAST CT CERVICAL SPINE WITHOUT CONTRAST TECHNIQUE: Multidetector CT imaging of the head and cervical spine was performed following the standard protocol without intravenous contrast. Multiplanar CT image reconstructions of the cervical spine were also generated. RADIATION DOSE REDUCTION: This exam was performed according to the departmental dose-optimization program which includes automated exposure control, adjustment of the mA and/or kV according to patient size and/or use of iterative reconstruction technique. COMPARISON:  None Available. FINDINGS: CT HEAD FINDINGS Brain: No acute territorial infarction, hemorrhage or intracranial mass. Moderate white matter hypodensity consistent with chronic small vessel ischemic change. Nonenlarged ventricles Vascular: No hyperdense vessels. Vertebral and carotid vascular calcification Skull: Normal. Negative for fracture or focal lesion. Sinuses/Orbits: Opacified left maxillary sinus with scattered  calcifications Other: None CT CERVICAL SPINE FINDINGS Alignment: Reversal of cervical lordosis. 3 mm anterolisthesis C3 on C4 and C4 on C5. This is likely due to advanced posterior facet degenerative changes. Facet alignment within normal limits. Skull base and vertebrae: No acute fracture. No primary bone lesion or focal pathologic process. Soft tissues and spinal canal: No prevertebral fluid or swelling. No visible canal hematoma. Disc levels: Multilevel degenerative changes. Partial ankylosis C5-C6. Moderate disc space narrowing C4-C5, advanced disc space narrowing C6-C7 and C7-T1. Hypertrophic facet degenerative changes at multiple levels with foraminal stenosis. Upper chest: Negative. Other: None IMPRESSION: 1. No CT evidence for acute intracranial abnormality. 2. Moderate chronic small vessel ischemic changes of the white matter. 3. Reversal of cervical lordosis and multilevel degenerative changes. No acute fracture Electronically Signed   By: Jasmine Pang M.D.   On: 01/18/2023 19:45     Assessment and Plan:   Lucas Bowman is a 72 y.o. male with permanent atrial fibrillation, history of mechanical aortic valve replacement (on chronic warfarin), history of ascending aortic aneurysm s/p mesh repair, mitral valve repair, HTN, HLD, diverticulosis, and GERD/gastritis who is being seen 01/19/2023 for the evaluation of anticoagulation management in the setting of acute GIB at the request of internal medicine.  # Acute GI Bleed with anemia # Mechanical Aortic Valve  # Need for Chronic Warfarin # Mitral Valve Repair # Atrial Fibrillation  - Would hold warfarin in the acute setting - Continue to follow daily INR - Follow-up GI recommendations and potential intervention - Would restart anticoagulation with heparin and/or warfarin when cleared from a GI standpoint  Risk Assessment/Risk Scores:               For questions or updates, please contact [facility] Please consult  [email or web address] for contact info under    Signed, Ralene Ok, MD  01/19/2023 12:45 AM

## 2023-01-19 NOTE — ED Notes (Signed)
Pt tolerated Infusion well. No adverse effects noted. Pt resting comfortably. Will start second infusion once available

## 2023-01-19 NOTE — Assessment & Plan Note (Signed)
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 Check ammonia level with next blood draw to make sure its not mild hepatic encephalopathy given appearance of liver on imaging today.

## 2023-01-19 NOTE — Assessment & Plan Note (Addendum)
St[employer] mechanical AVR. Mitral valve annuloplasty repair (doesn't seem to be an MV replacement based on the 2010 echos) Discussed bleeding with cards, per their recs: Holding coumadin, but no Vit K or other reversal agent No heparin gtt at the moment. Presumably start heparin gtt tomorrow as soon as EGD done and we can feel confident he likely wont start bleeding again. Long discussion with pt and family about how his situation is very complex, and he's between a rock and a hard place with both a significant AC need and a major active / recent bleed

## 2023-01-19 NOTE — Assessment & Plan Note (Signed)
Coumadin on hold Coreg also on hold for the moment Tele monitor Start short acting either PRN metoprolol or cardizem gtt if rate starts to elevate.

## 2023-01-19 NOTE — Assessment & Plan Note (Signed)
With hematemesis last night, in patient on coumadin for mechanical HV. CT scan GIB neg for active GIB for the moment Stomach ulcer? N/V from acute pancreatitis causing Mallory-Weis? NPO Transfuse as per ABLA below EDP d/w Dr. Meridee Score: GI will see in AM PPI GTT Sounds like next step likely EGD.

## 2023-01-20 DIAGNOSIS — D62 Acute posthemorrhagic anemia: Secondary | ICD-10-CM | POA: Diagnosis not present

## 2023-01-20 DIAGNOSIS — Z952 Presence of prosthetic heart valve: Secondary | ICD-10-CM | POA: Diagnosis not present

## 2023-01-20 DIAGNOSIS — I10 Essential (primary) hypertension: Secondary | ICD-10-CM | POA: Diagnosis not present

## 2023-01-20 DIAGNOSIS — D649 Anemia, unspecified: Secondary | ICD-10-CM | POA: Diagnosis not present

## 2023-01-20 DIAGNOSIS — K921 Melena: Secondary | ICD-10-CM | POA: Diagnosis not present

## 2023-01-20 DIAGNOSIS — K922 Gastrointestinal hemorrhage, unspecified: Secondary | ICD-10-CM | POA: Diagnosis not present

## 2023-01-20 DIAGNOSIS — K920 Hematemesis: Secondary | ICD-10-CM | POA: Diagnosis not present

## 2023-01-20 DIAGNOSIS — I482 Chronic atrial fibrillation, unspecified: Secondary | ICD-10-CM | POA: Diagnosis not present

## 2023-01-20 LAB — BPAM RBC
Blood Product Expiration Date: 202408252359
Blood Product Expiration Date: 202408252359
Blood Product Expiration Date: 202408262359
ISSUE DATE / TIME: 202407251018
Unit Type and Rh: 5100
Unit Type and Rh: 5100

## 2023-01-20 LAB — HEMOGLOBIN AND HEMATOCRIT, BLOOD
HCT: 23 % — ABNORMAL LOW (ref 39.0–52.0)
HCT: 23.8 % — ABNORMAL LOW (ref 39.0–52.0)
HCT: 24.7 % — ABNORMAL LOW (ref 39.0–52.0)
HCT: 28 % — ABNORMAL LOW (ref 39.0–52.0)
Hemoglobin: 7.5 g/dL — ABNORMAL LOW (ref 13.0–17.0)
Hemoglobin: 7.7 g/dL — ABNORMAL LOW (ref 13.0–17.0)
Hemoglobin: 7.8 g/dL — ABNORMAL LOW (ref 13.0–17.0)
Hemoglobin: 8.7 g/dL — ABNORMAL LOW (ref 13.0–17.0)

## 2023-01-20 LAB — TYPE AND SCREEN
ABO/RH(D): O POS
Unit division: 0

## 2023-01-20 LAB — BASIC METABOLIC PANEL
Anion gap: 8 (ref 5–15)
BUN: 16 mg/dL (ref 8–23)
CO2: 26 mmol/L (ref 22–32)
Calcium: 8.5 mg/dL — ABNORMAL LOW (ref 8.9–10.3)
Chloride: 105 mmol/L (ref 98–111)
Creatinine, Ser: 0.93 mg/dL (ref 0.61–1.24)
GFR, Estimated: >60 mL/min (ref >60)
Glucose, Bld: 97 mg/dL (ref 70–99)
Potassium: 3.8 mmol/L (ref 3.5–5.1)
Sodium: 139 mmol/L (ref 135–145)

## 2023-01-20 LAB — PREPARE RBC (CROSSMATCH)

## 2023-01-20 LAB — PROTIME-INR
INR: 2 — ABNORMAL HIGH (ref 0.8–1.2)
Prothrombin Time: 22.5 seconds — ABNORMAL HIGH (ref 11.4–15.2)

## 2023-01-20 MED ORDER — SODIUM CHLORIDE 0.9% IV SOLUTION: Freq: Once | INTRAVENOUS | Status: AC

## 2023-01-20 MED ORDER — CARVEDILOL 3.125 MG PO TABS
3.1250 mg | ORAL_TABLET | Freq: Two times a day (BID) | ORAL | Status: DC
  Administered 2023-01-20 (×2): 3.125 mg via ORAL
  Filled 2023-01-20 (×2): qty 1

## 2023-01-20 MED ORDER — CARVEDILOL 6.25 MG PO TABS
6.2500 mg | ORAL_TABLET | Freq: Two times a day (BID) | ORAL | Status: DC
  Administered 2023-01-20 – 2023-01-25 (×10): 6.25 mg via ORAL
  Filled 2023-01-20 (×10): qty 1

## 2023-01-20 MED ORDER — CARVEDILOL 3.125 MG PO TABS
3.1250 mg | ORAL_TABLET | Freq: Once | ORAL | Status: AC
  Administered 2023-01-20: 3.125 mg via ORAL
  Filled 2023-01-20: qty 1

## 2023-01-20 NOTE — Progress Notes (Signed)
PROGRESS NOTE    Lucas Bowman  ZOX:[id number] DOB: 22-Jan-1951 DOA: 01/18/2023 PCP: Kathlee Nations, MD   Chief Complaint  Patient presents with   Emesis   Diarrhea    Brief Narrative:    Lucas Bowman is a 72 y.o. male with medical history significant of Mechanical AVR, mitral valve repair / annuloplasty in 2001, A.Fib, on chronic coumadin for mechanical valve, HTN.  - Pt in to ED with onset of GIB with hematemesis ,  Maroon stools , Has had presyncope with this and fatigue.with h/o chronic afib and mechanical AVR on chronic warfarin , seen by GI,GI recommended holding coumadin and let INR drift down to plan EGD/Colon once INR<1.5.  Globin low 7.5 today, receiving 1 unit PRBC, INR remains at 2 today,   Assessment & Plan:   Principal Problem:   UGIB (upper gastrointestinal bleed) Active Problems:   ABLA (acute blood loss anemia)   HEART VALVE REPLACEMENT, HX OF   Acute pancreatitis   Chronic a-fib (HCC)   Essential hypertension   Cirrhosis (HCC)   MCI (mild cognitive impairment)   Current use of long term anticoagulation   H/O mechanical aortic valve replacement   Symptomatic anemia    UGIB (upper gastrointestinal bleed) Acute blood loss anemia - new diagnosis of cirrhosis, GI input greatly appreciated, plan for endoscopy once INR less than 1.5. -presents with hemoglobin 4.9 on admission, received 3 units, this morning Hgb low at 7.5 will transfuse another unit PRBC. -Continue to hold warfarin. -Continue to monitor CBC closely and transfuse as needed   Chronic a-fib (HCC) -Warfarin on hold, will start on heparin drip once INR less than 2 - Back on coreg as BP improved today.  Cirrhosis on CTA Normal LFTs No previous abdominal imaging. Denies alcohol use.   S/p mechanical AVR and mitral valve repair on Coumadin PT 28.6/INR 2.7 on admission. History today, start heparin gtt. once INR less than 2.   Patient with finding of abdominal pancreatitis on imaging -  Question of acute pancreatitis. - Being called on imaging study; however, Lipase normal, only having minimal epigastric discomfort. - No EtOH intake - H/o cholecystectomy in past  History of mechanical heart valve replacement - [employer] mechanical AVR. -Warfarin remains on hold, can start heparin gtt. once INR less than 2.  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 -Morning within normal limits    Essential hypertension Blood pressure initially soft, now improved, back on Coreg, continue to hold Lotensin    DVT prophylaxis: INR is 2  Code Status: Full Family Communication: D/W son by phone Disposition:   Status is: Inpatient  Consultants:  Cardiology GI   Subjective:  No significant events overnight, he denies any complaints today.  Objective: Vitals:   01/20/23 0748 01/20/23 1015 01/20/23 1045 01/20/23 1230  BP: 118/83 115/69 127/81 108/66  Pulse: 100 85 96   Resp: 20 16 17  (!) 21  Temp: 98.4 F (36.9 C) 97.6 F (36.4 C)  98.4 F (36.9 C)  TempSrc: Oral Oral  Oral  SpO2: 95% 98% 97% 95%  Weight:      Height:        Intake/Output Summary (Last 24 hours) at 01/20/2023 1440 Last data filed at 01/20/2023 1257 Gross per 24 hour  Intake 338 ml  Output 200 ml  Net 138 ml   [employer]  01/18/23 1833 01/19/23 1827  Weight: 77.1 kg 75.7 kg    Examination:  Awake Alert, Oriented X 3, No new F.N deficits, Normal affect Symmetrical Chest wall movement, Good air movement bilaterally, CTAB Irregular, mechanical heart sound present +ve B.Sounds, Abd Soft, No tenderness, No rebound - guarding or rigidity. No Cyanosis, Clubbing or edema, No new Rash or bruise      Data Reviewed: I have personally reviewed following labs and imaging studies  CBC: Recent Labs  Lab 01/18/23 1845 01/18/23 1851 01/19/23 0333  01/19/23 1313 01/19/23 1848 01/20/23 0002 01/20/23 0621  WBC 7.2  --   --   --   --   --   --   NEUTROABS 4.7  --   --   --   --   --   --   HGB 4.9*   < > 6.5* 7.7* 8.9* 7.7* 7.5*  HCT 15.5*   < > 19.8* 23.7* 27.3* 23.8* 23.0*  MCV 86.6  --   --   --   --   --   --   PLT 198  --   --   --   --   --   --    < > = values in this interval not displayed.    Basic Metabolic Panel: Recent Labs  Lab 01/18/23 1845 01/18/23 1851 01/19/23 0730 01/20/23 0621  NA 134* 136 138 139  K 4.0 4.1 3.7 3.8  CL 101 101 99 105  CO2 26  --  25 26  GLUCOSE 108* 104* 108* 97  BUN 29* 30* 23 16  CREATININE 1.11 1.20 1.09 0.93  CALCIUM 8.4*  --  8.7* 8.5*    GFR: Estimated Creatinine Clearance: 68.4 mL/min (by C-G formula based on SCr of 0.93 mg/dL).  Liver Function Tests: Recent Labs  Lab 01/18/23 1845 01/19/23 0730  AST 22 22  ALT 13 14  ALKPHOS 50 46  BILITOT 0.2* 0.9  PROT 5.9* 5.6*  ALBUMIN 3.4* 3.3*    CBG: No results for input(s): "GLUCAP" in the last 168 hours.   No results found for this or any previous visit (from the past 240 hour(s)).       Radiology Studies: CT ANGIO GI BLEED  Result Date: 01/18/2023 CLINICAL DATA:  Left lower quadrant abdominal pain.  Bloody stools. EXAM: CTA ABDOMEN AND PELVIS WITHOUT AND WITH CONTRAST TECHNIQUE: Multidetector CT imaging of the abdomen and pelvis was performed using the standard protocol during bolus administration of intravenous contrast. Multiplanar reconstructed images and MIPs were obtained and reviewed to evaluate the vascular anatomy. RADIATION DOSE REDUCTION: This exam was performed according to the departmental dose-optimization program which includes automated exposure control, adjustment of the mA and/or kV according to patient size and/or use of iterative reconstruction technique. CONTRAST:  OMNIPAQUE IOHEXOL 350 MG/ML SOLN COMPARISON:  None Available. FINDINGS: Evaluation of this exam is limited due to respiratory  motion artifact. VASCULAR Aorta: Moderate atherosclerotic calcification. No aneurysmal dilatation or dissection. No periaortic fluid collection. Celiac: The celiac trunk and its major branches are patent. SMA: The SMA is patent. Renals: The renal arteries are patent. IMA: The IMA is patent. Inflow: Mild atherosclerotic calcification of the iliac arteries. The iliac arteries are patent. No S1 dilatation or dissection. Proximal Outflow: The visualized proximal outflow is patent. Veins: The visualized iliac veins and IVC are unremarkable. The SMV, splenic vein, and main portal vein are patent. No portal venous gas. Review of the MIP images confirms the above findings.  NON-VASCULAR Lower chest: The visualized lung bases are clear. No intra-abdominal free air or free fluid. Hepatobiliary: Cirrhosis. There is mild periportal edema. Cholecystectomy. Pancreas: There is inflammatory changes of the upper abdomen surrounding the pancreas and duodenal C-loop suspicious for acute pancreatitis. Correlation with pancreatic enzymes recommended. No drainable fluid collection/abscess or pseudocyst. No dilatation of the main pancreatic duct or gland atrophy. Spleen: Normal in size without focal abnormality. Adrenals/Urinary Tract: The adrenal glands unremarkable. There is no hydronephrosis or nephrolithiasis on either side. Subcentimeter right renal hypodense lesions are too small to characterize, likely cysts. No imaging follow-up. There is symmetric enhancement of the kidneys. The visualized ureters and urinary bladder appear unremarkable. Stomach/Bowel: Inflammatory changes surrounding the duodenal C-loop likely related to acute pancreatitis. Duodenitis as the cause of the inflammatory process is less likely. There is sigmoid diverticulosis without active inflammatory changes. There is no bowel obstruction. No CT findings of active GI bleed. The appendix is normal. Lymphatic: Mildly enlarged peripancreatic lymph nodes, likely  reactive. Reproductive: The prostate and seminal vesicles are grossly unremarkable. Other: None Musculoskeletal: Degenerative changes of the spine. No acute osseous pathology. IMPRESSION: 1. No CT findings of active GI bleed. 2. Findings suspicious for acute pancreatitis. Correlation with pancreatic enzymes recommended. 3. Cirrhosis. 4. Sigmoid diverticulosis. No bowel obstruction. Normal appendix. Electronically Signed   By: Elgie Collard M.D.   On: 01/18/2023 22:14   CT Head Wo Contrast  Result Date: 01/18/2023 CLINICAL DATA:  Head and neck trauma EXAM: CT HEAD WITHOUT CONTRAST CT CERVICAL SPINE WITHOUT CONTRAST TECHNIQUE: Multidetector CT imaging of the head and cervical spine was performed following the standard protocol without intravenous contrast. Multiplanar CT image reconstructions of the cervical spine were also generated. RADIATION DOSE REDUCTION: This exam was performed according to the departmental dose-optimization program which includes automated exposure control, adjustment of the mA and/or kV according to patient size and/or use of iterative reconstruction technique. COMPARISON:  None Available. FINDINGS: CT HEAD FINDINGS Brain: No acute territorial infarction, hemorrhage or intracranial mass. Moderate white matter hypodensity consistent with chronic small vessel ischemic change. Nonenlarged ventricles Vascular: No hyperdense vessels. Vertebral and carotid vascular calcification Skull: Normal. Negative for fracture or focal lesion. Sinuses/Orbits: Opacified left maxillary sinus with scattered calcifications Other: None CT CERVICAL SPINE FINDINGS Alignment: Reversal of cervical lordosis. 3 mm anterolisthesis C3 on C4 and C4 on C5. This is likely due to advanced posterior facet degenerative changes. Facet alignment within normal limits. Skull base and vertebrae: No acute fracture. No primary bone lesion or focal pathologic process. Soft tissues and spinal canal: No prevertebral fluid or  swelling. No visible canal hematoma. Disc levels: Multilevel degenerative changes. Partial ankylosis C5-C6. Moderate disc space narrowing C4-C5, advanced disc space narrowing C6-C7 and C7-T1. Hypertrophic facet degenerative changes at multiple levels with foraminal stenosis. Upper chest: Negative. Other: None IMPRESSION: 1. No CT evidence for acute intracranial abnormality. 2. Moderate chronic small vessel ischemic changes of the white matter. 3. Reversal of cervical lordosis and multilevel degenerative changes. No acute fracture Electronically Signed   By: Jasmine Pang M.D.   On: 01/18/2023 19:45   CT Cervical Spine Wo Contrast  Result Date: 01/18/2023 CLINICAL DATA:  Head and neck trauma EXAM: CT HEAD WITHOUT CONTRAST CT CERVICAL SPINE WITHOUT CONTRAST TECHNIQUE: Multidetector CT imaging of the head and cervical spine was performed following the standard protocol without intravenous contrast. Multiplanar CT image reconstructions of the cervical spine were also generated. RADIATION DOSE REDUCTION: This exam was performed according to the departmental dose-optimization  program which includes automated exposure control, adjustment of the mA and/or kV according to patient size and/or use of iterative reconstruction technique. COMPARISON:  None Available. FINDINGS: CT HEAD FINDINGS Brain: No acute territorial infarction, hemorrhage or intracranial mass. Moderate white matter hypodensity consistent with chronic small vessel ischemic change. Nonenlarged ventricles Vascular: No hyperdense vessels. Vertebral and carotid vascular calcification Skull: Normal. Negative for fracture or focal lesion. Sinuses/Orbits: Opacified left maxillary sinus with scattered calcifications Other: None CT CERVICAL SPINE FINDINGS Alignment: Reversal of cervical lordosis. 3 mm anterolisthesis C3 on C4 and C4 on C5. This is likely due to advanced posterior facet degenerative changes. Facet alignment within normal limits. Skull base and  vertebrae: No acute fracture. No primary bone lesion or focal pathologic process. Soft tissues and spinal canal: No prevertebral fluid or swelling. No visible canal hematoma. Disc levels: Multilevel degenerative changes. Partial ankylosis C5-C6. Moderate disc space narrowing C4-C5, advanced disc space narrowing C6-C7 and C7-T1. Hypertrophic facet degenerative changes at multiple levels with foraminal stenosis. Upper chest: Negative. Other: None IMPRESSION: 1. No CT evidence for acute intracranial abnormality. 2. Moderate chronic small vessel ischemic changes of the white matter. 3. Reversal of cervical lordosis and multilevel degenerative changes. No acute fracture Electronically Signed   By: Jasmine Pang M.D.   On: 01/18/2023 19:45        Scheduled Meds:  allopurinol  300 mg Oral Daily   carvedilol  6.25 mg Oral BID WC   DULoxetine  30 mg Oral Daily   loratadine  10 mg Oral Daily   multivitamin with minerals  1 tablet Oral Daily   traZODone  100 mg Oral QHS   Continuous Infusions:  pantoprazole 8 mg/hr (01/20/23 0140)     LOS: 1 day     Huey Bienenstock, MD [facility]   To contact the attending provider between 7A-7P or the covering provider during after hours 7P-7A, please log into the web site [email or web address] and access using universal [place] password for that web site. If you do not have the password, please call the hospital operator.  01/20/2023, 2:40 PM

## 2023-01-20 NOTE — Progress Notes (Addendum)
Progress Note  Patient Name: Lucas Bowman Date of Encounter: 01/20/2023  [facility] Cardiologist: None   Subjective   Admitted with GI Bleed with h/o chronic afib and mechanical AVR on chronic warfarin.  Now off anticoagulation.  GI recommended holding coumadin and let INR drift down to plan EGD/Colon once INR<1.5.  Hbg 7.5 today.  INR 2.0.  Denies any chest pain but has SOB and had a bad night as his heart rate went high into the 120's and he felt bad  Inpatient Medications    Scheduled Meds:  allopurinol  300 mg Oral Daily   carvedilol  3.125 mg Oral BID WC   DULoxetine  30 mg Oral Daily   loratadine  10 mg Oral Daily   multivitamin with minerals  1 tablet Oral Daily   traZODone  100 mg Oral QHS   Continuous Infusions:  pantoprazole 8 mg/hr (01/20/23 0140)   PRN Meds: acetaminophen **OR** acetaminophen, albuterol, cyclobenzaprine, HYDROcodone-acetaminophen, ondansetron **OR** ondansetron (ZOFRAN) IV   Vital Signs    Vitals:   01/20/23 0010 01/20/23 0043 01/20/23 0400 01/20/23 0748  BP: 118/73 118/73 117/77 118/83  Pulse: (!) 109 (!) 116 82 100  Resp: (!) 22  17 20   Temp: 98.2 F (36.8 C)  98.1 F (36.7 C) 98.4 F (36.9 C)  TempSrc: Oral  Oral Oral  SpO2: 98%  93% 95%  Weight:      Height:        Intake/Output Summary (Last 24 hours) at 01/20/2023 0909 Last data filed at 01/19/2023 2108 Gross per 24 hour  Intake --  Output 200 ml  Net -200 ml      01/19/2023    6:27 PM 01/18/2023    6:33 PM 03/08/2019    2:38 PM  Last 3 Weights  Weight (lbs) 166 lb 14.2 oz 170 lb 172 lb 9.6 oz  Weight (kg) 75.7 kg 77.111 kg 78.291 kg      Telemetry    Atrial fibrillation in the 90's but at times up in the 120's - Personally Reviewed  ECG    No new EKG to review - Personally Reviewed  Physical Exam   GEN: No acute distress.   Neck: No JVD Cardiac: irregularly irregular, no murmurs, rubs, or gallops. Crisp mechanical heart sounds Respiratory: Clear to  auscultation bilaterally. GI: Soft, nontender, non-distended  MS: No edema; No deformity. Neuro:  Nonfocal  Psych: Normal affect   Labs    High Sensitivity Troponin:  No results for input(s): "TROPONINIHS" in the last 720 hours.    Chemistry Recent Labs  Lab 01/18/23 1845 01/18/23 1851 01/19/23 0730  NA 134* 136 138  K 4.0 4.1 3.7  CL 101 101 99  CO2 26  --  25  GLUCOSE 108* 104* 108*  BUN 29* 30* 23  CREATININE 1.11 1.20 1.09  CALCIUM 8.4*  --  8.7*  PROT 5.9*  --  5.6*  ALBUMIN 3.4*  --  3.3*  AST 22  --  22  ALT 13  --  14  ALKPHOS 50  --  46  BILITOT 0.2*  --  0.9  GFRNONAA >60  --  >60  ANIONGAP 7  --  14     Hematology Recent Labs  Lab 01/18/23 1845 01/18/23 1851 01/19/23 1848 01/20/23 0002 01/20/23 0621  WBC 7.2  --   --   --   --   RBC 1.79*  --   --   --   --  HGB 4.9*   < > 8.9* 7.7* 7.5*  HCT 15.5*   < > 27.3* 23.8* 23.0*  MCV 86.6  --   --   --   --   MCH 27.4  --   --   --   --   MCHC 31.6  --   --   --   --   RDW 16.1*  --   --   --   --   PLT 198  --   --   --   --    < > = values in this interval not displayed.    BNPNo results for input(s): "BNP", "PROBNP" in the last 168 hours.   DDimer No results for input(s): "DDIMER" in the last 168 hours.   Radiology    CT ANGIO GI BLEED  Result Date: 01/18/2023 CLINICAL DATA:  Left lower quadrant abdominal pain.  Bloody stools. EXAM: CTA ABDOMEN AND PELVIS WITHOUT AND WITH CONTRAST TECHNIQUE: Multidetector CT imaging of the abdomen and pelvis was performed using the standard protocol during bolus administration of intravenous contrast. Multiplanar reconstructed images and MIPs were obtained and reviewed to evaluate the vascular anatomy. RADIATION DOSE REDUCTION: This exam was performed according to the departmental dose-optimization program which includes automated exposure control, adjustment of the mA and/or kV according to patient size and/or use of iterative reconstruction technique. CONTRAST:   OMNIPAQUE IOHEXOL 350 MG/ML SOLN COMPARISON:  None Available. FINDINGS: Evaluation of this exam is limited due to respiratory motion artifact. VASCULAR Aorta: Moderate atherosclerotic calcification. No aneurysmal dilatation or dissection. No periaortic fluid collection. Celiac: The celiac trunk and its major branches are patent. SMA: The SMA is patent. Renals: The renal arteries are patent. IMA: The IMA is patent. Inflow: Mild atherosclerotic calcification of the iliac arteries. The iliac arteries are patent. No S1 dilatation or dissection. Proximal Outflow: The visualized proximal outflow is patent. Veins: The visualized iliac veins and IVC are unremarkable. The SMV, splenic vein, and main portal vein are patent. No portal venous gas. Review of the MIP images confirms the above findings. NON-VASCULAR Lower chest: The visualized lung bases are clear. No intra-abdominal free air or free fluid. Hepatobiliary: Cirrhosis. There is mild periportal edema. Cholecystectomy. Pancreas: There is inflammatory changes of the upper abdomen surrounding the pancreas and duodenal C-loop suspicious for acute pancreatitis. Correlation with pancreatic enzymes recommended. No drainable fluid collection/abscess or pseudocyst. No dilatation of the main pancreatic duct or gland atrophy. Spleen: Normal in size without focal abnormality. Adrenals/Urinary Tract: The adrenal glands unremarkable. There is no hydronephrosis or nephrolithiasis on either side. Subcentimeter right renal hypodense lesions are too small to characterize, likely cysts. No imaging follow-up. There is symmetric enhancement of the kidneys. The visualized ureters and urinary bladder appear unremarkable. Stomach/Bowel: Inflammatory changes surrounding the duodenal C-loop likely related to acute pancreatitis. Duodenitis as the cause of the inflammatory process is less likely. There is sigmoid diverticulosis without active inflammatory changes. There is no bowel  obstruction. No CT findings of active GI bleed. The appendix is normal. Lymphatic: Mildly enlarged peripancreatic lymph nodes, likely reactive. Reproductive: The prostate and seminal vesicles are grossly unremarkable. Other: None Musculoskeletal: Degenerative changes of the spine. No acute osseous pathology. IMPRESSION: 1. No CT findings of active GI bleed. 2. Findings suspicious for acute pancreatitis. Correlation with pancreatic enzymes recommended. 3. Cirrhosis. 4. Sigmoid diverticulosis. No bowel obstruction. Normal appendix. Electronically Signed   By: Elgie Collard M.D.   On: 01/18/2023 22:14   CT Head Wo  Contrast  Result Date: 01/18/2023 CLINICAL DATA:  Head and neck trauma EXAM: CT HEAD WITHOUT CONTRAST CT CERVICAL SPINE WITHOUT CONTRAST TECHNIQUE: Multidetector CT imaging of the head and cervical spine was performed following the standard protocol without intravenous contrast. Multiplanar CT image reconstructions of the cervical spine were also generated. RADIATION DOSE REDUCTION: This exam was performed according to the departmental dose-optimization program which includes automated exposure control, adjustment of the mA and/or kV according to patient size and/or use of iterative reconstruction technique. COMPARISON:  None Available. FINDINGS: CT HEAD FINDINGS Brain: No acute territorial infarction, hemorrhage or intracranial mass. Moderate white matter hypodensity consistent with chronic small vessel ischemic change. Nonenlarged ventricles Vascular: No hyperdense vessels. Vertebral and carotid vascular calcification Skull: Normal. Negative for fracture or focal lesion. Sinuses/Orbits: Opacified left maxillary sinus with scattered calcifications Other: None CT CERVICAL SPINE FINDINGS Alignment: Reversal of cervical lordosis. 3 mm anterolisthesis C3 on C4 and C4 on C5. This is likely due to advanced posterior facet degenerative changes. Facet alignment within normal limits. Skull base and vertebrae:  No acute fracture. No primary bone lesion or focal pathologic process. Soft tissues and spinal canal: No prevertebral fluid or swelling. No visible canal hematoma. Disc levels: Multilevel degenerative changes. Partial ankylosis C5-C6. Moderate disc space narrowing C4-C5, advanced disc space narrowing C6-C7 and C7-T1. Hypertrophic facet degenerative changes at multiple levels with foraminal stenosis. Upper chest: Negative. Other: None IMPRESSION: 1. No CT evidence for acute intracranial abnormality. 2. Moderate chronic small vessel ischemic changes of the white matter. 3. Reversal of cervical lordosis and multilevel degenerative changes. No acute fracture Electronically Signed   By: Jasmine Pang M.D.   On: 01/18/2023 19:45   CT Cervical Spine Wo Contrast  Result Date: 01/18/2023 CLINICAL DATA:  Head and neck trauma EXAM: CT HEAD WITHOUT CONTRAST CT CERVICAL SPINE WITHOUT CONTRAST TECHNIQUE: Multidetector CT imaging of the head and cervical spine was performed following the standard protocol without intravenous contrast. Multiplanar CT image reconstructions of the cervical spine were also generated. RADIATION DOSE REDUCTION: This exam was performed according to the departmental dose-optimization program which includes automated exposure control, adjustment of the mA and/or kV according to patient size and/or use of iterative reconstruction technique. COMPARISON:  None Available. FINDINGS: CT HEAD FINDINGS Brain: No acute territorial infarction, hemorrhage or intracranial mass. Moderate white matter hypodensity consistent with chronic small vessel ischemic change. Nonenlarged ventricles Vascular: No hyperdense vessels. Vertebral and carotid vascular calcification Skull: Normal. Negative for fracture or focal lesion. Sinuses/Orbits: Opacified left maxillary sinus with scattered calcifications Other: None CT CERVICAL SPINE FINDINGS Alignment: Reversal of cervical lordosis. 3 mm anterolisthesis C3 on C4 and C4 on C5.  This is likely due to advanced posterior facet degenerative changes. Facet alignment within normal limits. Skull base and vertebrae: No acute fracture. No primary bone lesion or focal pathologic process. Soft tissues and spinal canal: No prevertebral fluid or swelling. No visible canal hematoma. Disc levels: Multilevel degenerative changes. Partial ankylosis C5-C6. Moderate disc space narrowing C4-C5, advanced disc space narrowing C6-C7 and C7-T1. Hypertrophic facet degenerative changes at multiple levels with foraminal stenosis. Upper chest: Negative. Other: None IMPRESSION: 1. No CT evidence for acute intracranial abnormality. 2. Moderate chronic small vessel ischemic changes of the white matter. 3. Reversal of cervical lordosis and multilevel degenerative changes. No acute fracture Electronically Signed   By: Jasmine Pang M.D.   On: 01/18/2023 19:45    Patient Profile     72 y.o. male  with permanent atrial fibrillation, history  of mechanical aortic valve replacement (on chronic warfarin), history of ascending aortic aneurysm s/p mesh repair, mitral valve repair, HTN, HLD, diverticulosis, and GERD/gastritis who is being seen 01/19/2023 for the evaluation of anticoagulation management in the setting of acute GIB at the request of internal medicine.   Assessment & Plan     Acute GI bleed -GI following and plans for EGD/colonoscopy once INR<1.5 -Hbg 7.5 today -BP stable at 114/18mmHg  2.  Chronic atrial fibrillation -CHADS2VASC score 2 (Age>65/HTN)  -HR in the 90's at rest but went up into the 120's last night -warfarin on hold due to GIB -his home does of carvedilol is 6.25mg  BID but was only placed on 3.125mg  BID due to concern for soft BP in the setting of GIB and HR also likely being driven by low Hbg -BP good today so will increase Carvedilol back to 6.25mg  BID  3.  Mechanical AVR -on chronic coumadin which is on hold for GIB -INR 2 today -waiting for INR to get <1.5 prior to  endoscopy -crisp mechanical heart sounds on exam  4.  HTN -BP controlled -see above>>increasing Carvedilol back to his 6.25mg  BID dose due to increase HR -Home Lotensin on hold        For questions or updates, please contact [facility] Please consult [email or web address] for contact info under        Signed, Armanda Magic, MD  01/20/2023, 9:09 AM

## 2023-01-20 NOTE — Progress Notes (Signed)
Progress Note   LOS: 1 day   Chief Complaint: GI bleed   Subjective   Patient states he has had no further bowel movements.  Denies nausea/vomiting.  Denies abdominal pain.  Tolerating diet without difficulty.   Objective   Vital signs in last 24 hours: Temp:  [97.6 F (36.4 C)-98.4 F (36.9 C)] 98.4 F (36.9 C) (07/25 0748) Pulse Rate:  [49-137] 85 (07/25 1015) Resp:  [13-24] 16 (07/25 1015) BP: (108-130)/(61-93) 115/69 (07/25 1015) SpO2:  [92 %-100 %] 98 % (07/25 1015) Weight:  [75.7 kg] 75.7 kg (07/24 1827) Last BM Date : 01/18/23 Last BM recorded by nurses in past 5 days No data recorded  General:   male in no acute distress  Heart:  Regular rate and rhythm; no murmurs Pulm: Clear anteriorly; no wheezing Abdomen: soft, nondistended, normal bowel sounds in all quadrants. Nontender without guarding. No organomegaly appreciated. Extremities:  No edema Neurologic:  Alert and  oriented x4;  No focal deficits.  Psych:  Cooperative. Normal mood and affect.  Intake/Output from previous day: 07/24 0701 - 07/25 0700 In: 356.3 [Blood:356.3] Out: 200 [Urine:200] Intake/Output this shift: No intake/output data recorded.  Studies/Results: CT ANGIO GI BLEED  Result Date: 01/18/2023 CLINICAL DATA:  Left lower quadrant abdominal pain.  Bloody stools. EXAM: CTA ABDOMEN AND PELVIS WITHOUT AND WITH CONTRAST TECHNIQUE: Multidetector CT imaging of the abdomen and pelvis was performed using the standard protocol during bolus administration of intravenous contrast. Multiplanar reconstructed images and MIPs were obtained and reviewed to evaluate the vascular anatomy. RADIATION DOSE REDUCTION: This exam was performed according to the departmental dose-optimization program which includes automated exposure control, adjustment of the mA and/or kV according to patient size and/or use of iterative reconstruction technique. CONTRAST:  OMNIPAQUE IOHEXOL 350 MG/ML SOLN COMPARISON:  None  Available. FINDINGS: Evaluation of this exam is limited due to respiratory motion artifact. VASCULAR Aorta: Moderate atherosclerotic calcification. No aneurysmal dilatation or dissection. No periaortic fluid collection. Celiac: The celiac trunk and its major branches are patent. SMA: The SMA is patent. Renals: The renal arteries are patent. IMA: The IMA is patent. Inflow: Mild atherosclerotic calcification of the iliac arteries. The iliac arteries are patent. No S1 dilatation or dissection. Proximal Outflow: The visualized proximal outflow is patent. Veins: The visualized iliac veins and IVC are unremarkable. The SMV, splenic vein, and main portal vein are patent. No portal venous gas. Review of the MIP images confirms the above findings. NON-VASCULAR Lower chest: The visualized lung bases are clear. No intra-abdominal free air or free fluid. Hepatobiliary: Cirrhosis. There is mild periportal edema. Cholecystectomy. Pancreas: There is inflammatory changes of the upper abdomen surrounding the pancreas and duodenal C-loop suspicious for acute pancreatitis. Correlation with pancreatic enzymes recommended. No drainable fluid collection/abscess or pseudocyst. No dilatation of the main pancreatic duct or gland atrophy. Spleen: Normal in size without focal abnormality. Adrenals/Urinary Tract: The adrenal glands unremarkable. There is no hydronephrosis or nephrolithiasis on either side. Subcentimeter right renal hypodense lesions are too small to characterize, likely cysts. No imaging follow-up. There is symmetric enhancement of the kidneys. The visualized ureters and urinary bladder appear unremarkable. Stomach/Bowel: Inflammatory changes surrounding the duodenal C-loop likely related to acute pancreatitis. Duodenitis as the cause of the inflammatory process is less likely. There is sigmoid diverticulosis without active inflammatory changes. There is no bowel obstruction. No CT findings of active GI bleed. The appendix is  normal. Lymphatic: Mildly enlarged peripancreatic lymph nodes, likely reactive. Reproductive: The prostate  and seminal vesicles are grossly unremarkable. Other: None Musculoskeletal: Degenerative changes of the spine. No acute osseous pathology. IMPRESSION: 1. No CT findings of active GI bleed. 2. Findings suspicious for acute pancreatitis. Correlation with pancreatic enzymes recommended. 3. Cirrhosis. 4. Sigmoid diverticulosis. No bowel obstruction. Normal appendix. Electronically Signed   By: Elgie Collard M.D.   On: 01/18/2023 22:14   CT Head Wo Contrast  Result Date: 01/18/2023 CLINICAL DATA:  Head and neck trauma EXAM: CT HEAD WITHOUT CONTRAST CT CERVICAL SPINE WITHOUT CONTRAST TECHNIQUE: Multidetector CT imaging of the head and cervical spine was performed following the standard protocol without intravenous contrast. Multiplanar CT image reconstructions of the cervical spine were also generated. RADIATION DOSE REDUCTION: This exam was performed according to the departmental dose-optimization program which includes automated exposure control, adjustment of the mA and/or kV according to patient size and/or use of iterative reconstruction technique. COMPARISON:  None Available. FINDINGS: CT HEAD FINDINGS Brain: No acute territorial infarction, hemorrhage or intracranial mass. Moderate white matter hypodensity consistent with chronic small vessel ischemic change. Nonenlarged ventricles Vascular: No hyperdense vessels. Vertebral and carotid vascular calcification Skull: Normal. Negative for fracture or focal lesion. Sinuses/Orbits: Opacified left maxillary sinus with scattered calcifications Other: None CT CERVICAL SPINE FINDINGS Alignment: Reversal of cervical lordosis. 3 mm anterolisthesis C3 on C4 and C4 on C5. This is likely due to advanced posterior facet degenerative changes. Facet alignment within normal limits. Skull base and vertebrae: No acute fracture. No primary bone lesion or focal pathologic  process. Soft tissues and spinal canal: No prevertebral fluid or swelling. No visible canal hematoma. Disc levels: Multilevel degenerative changes. Partial ankylosis C5-C6. Moderate disc space narrowing C4-C5, advanced disc space narrowing C6-C7 and C7-T1. Hypertrophic facet degenerative changes at multiple levels with foraminal stenosis. Upper chest: Negative. Other: None IMPRESSION: 1. No CT evidence for acute intracranial abnormality. 2. Moderate chronic small vessel ischemic changes of the white matter. 3. Reversal of cervical lordosis and multilevel degenerative changes. No acute fracture Electronically Signed   By: Jasmine Pang M.D.   On: 01/18/2023 19:45   CT Cervical Spine Wo Contrast  Result Date: 01/18/2023 CLINICAL DATA:  Head and neck trauma EXAM: CT HEAD WITHOUT CONTRAST CT CERVICAL SPINE WITHOUT CONTRAST TECHNIQUE: Multidetector CT imaging of the head and cervical spine was performed following the standard protocol without intravenous contrast. Multiplanar CT image reconstructions of the cervical spine were also generated. RADIATION DOSE REDUCTION: This exam was performed according to the departmental dose-optimization program which includes automated exposure control, adjustment of the mA and/or kV according to patient size and/or use of iterative reconstruction technique. COMPARISON:  None Available. FINDINGS: CT HEAD FINDINGS Brain: No acute territorial infarction, hemorrhage or intracranial mass. Moderate white matter hypodensity consistent with chronic small vessel ischemic change. Nonenlarged ventricles Vascular: No hyperdense vessels. Vertebral and carotid vascular calcification Skull: Normal. Negative for fracture or focal lesion. Sinuses/Orbits: Opacified left maxillary sinus with scattered calcifications Other: None CT CERVICAL SPINE FINDINGS Alignment: Reversal of cervical lordosis. 3 mm anterolisthesis C3 on C4 and C4 on C5. This is likely due to advanced posterior facet degenerative  changes. Facet alignment within normal limits. Skull base and vertebrae: No acute fracture. No primary bone lesion or focal pathologic process. Soft tissues and spinal canal: No prevertebral fluid or swelling. No visible canal hematoma. Disc levels: Multilevel degenerative changes. Partial ankylosis C5-C6. Moderate disc space narrowing C4-C5, advanced disc space narrowing C6-C7 and C7-T1. Hypertrophic facet degenerative changes at multiple levels with foraminal stenosis. Upper chest:  Negative. Other: None IMPRESSION: 1. No CT evidence for acute intracranial abnormality. 2. Moderate chronic small vessel ischemic changes of the white matter. 3. Reversal of cervical lordosis and multilevel degenerative changes. No acute fracture Electronically Signed   By: Jasmine Pang M.D.   On: 01/18/2023 19:45    Lab Results: Recent Labs    01/18/23 1845 01/18/23 1851 01/19/23 1848 01/20/23 0002 01/20/23 0621  WBC 7.2  --   --   --   --   HGB 4.9*   < > 8.9* 7.7* 7.5*  HCT 15.5*   < > 27.3* 23.8* 23.0*  PLT 198  --   --   --   --    < > = values in this interval not displayed.   BMET Recent Labs    01/18/23 1845 01/18/23 1851 01/19/23 0730 01/20/23 0621  NA 134* 136 138 139  K 4.0 4.1 3.7 3.8  CL 101 101 99 105  CO2 26  --  25 26  GLUCOSE 108* 104* 108* 97  BUN 29* 30* 23 16  CREATININE 1.11 1.20 1.09 0.93  CALCIUM 8.4*  --  8.7* 8.5*   LFT Recent Labs    01/19/23 0730  PROT 5.6*  ALBUMIN 3.3*  AST 22  ALT 14  ALKPHOS 46  BILITOT 0.9   PT/INR Recent Labs    01/18/23 1845 01/19/23 1313  LABPROT 28.6* 23.3*  INR 2.7* 2.0*     Scheduled Meds:  allopurinol  300 mg Oral Daily   carvedilol  3.125 mg Oral Once   carvedilol  6.25 mg Oral BID WC   DULoxetine  30 mg Oral Daily   loratadine  10 mg Oral Daily   multivitamin with minerals  1 tablet Oral Daily   traZODone  100 mg Oral QHS   Continuous Infusions:  pantoprazole 8 mg/hr (01/20/23 0140)       Impression/Plan:    72 year old male history of prosthetic aortic valve on Coumadin presenting with symptomatic anemia and initial Hgb of 4.9 in the setting of melena and questionable hematemesis with CTA showing inflammatory changes in the upper abdomen around the duodenal C-loop.  With intermittent NSAID use suspicious for duodenitis, PUD, gastritis, esophagitis, or even varices with new finding of cirrhosis on CTA (less likely varices).  History of H. pylori negative gastritis on EGD in 2015.  Hgb 7.5 BUN 16, improved from 30.  Creatinine 0.93 Awaiting INR to improve to around 1.5 prior to scheduling EGD/colonoscopy in case polyps are present and need to be resected.  Hemoglobin currently stable and no further bleeding at this time. --Continue daily CBC and transfuse as needed to maintain HGB > 7  -- Continue to hold Coumadin.  Appreciate cardiology recommendations -- Await INR results.  Plan for EGD/colonoscopy at some point.  Timing TBD  Cirrhosis on CTA Normal LFTs No previous abdominal imaging. Denies alcohol use.   S/p mechanical AVR and mitral valve repair on Coumadin PT 28.6/INR 2.7 Repeat PT/INR not drawn today yet   Aneurysm of ascending aorta   Persistent afib   Tianne Plott M Kaily Wragg  01/20/2023, 10:28 AM

## 2023-01-21 DIAGNOSIS — D62 Acute posthemorrhagic anemia: Secondary | ICD-10-CM | POA: Diagnosis not present

## 2023-01-21 DIAGNOSIS — K922 Gastrointestinal hemorrhage, unspecified: Secondary | ICD-10-CM | POA: Diagnosis not present

## 2023-01-21 DIAGNOSIS — Z800 Family history of malignant neoplasm of digestive organs: Secondary | ICD-10-CM

## 2023-01-21 DIAGNOSIS — D649 Anemia, unspecified: Secondary | ICD-10-CM | POA: Diagnosis not present

## 2023-01-21 DIAGNOSIS — Z952 Presence of prosthetic heart valve: Secondary | ICD-10-CM | POA: Diagnosis not present

## 2023-01-21 DIAGNOSIS — I10 Essential (primary) hypertension: Secondary | ICD-10-CM | POA: Diagnosis not present

## 2023-01-21 DIAGNOSIS — K921 Melena: Secondary | ICD-10-CM | POA: Diagnosis not present

## 2023-01-21 DIAGNOSIS — I482 Chronic atrial fibrillation, unspecified: Secondary | ICD-10-CM | POA: Diagnosis not present

## 2023-01-21 DIAGNOSIS — K920 Hematemesis: Secondary | ICD-10-CM | POA: Diagnosis not present

## 2023-01-21 LAB — CBC
HCT: 26.6 % — ABNORMAL LOW (ref 39.0–52.0)
Hemoglobin: 8.4 g/dL — ABNORMAL LOW (ref 13.0–17.0)
MCH: 28 pg (ref 26.0–34.0)
MCHC: 31.6 g/dL (ref 30.0–36.0)
MCV: 88.7 fL (ref 80.0–100.0)
Platelets: 182 10*3/uL (ref 150–400)
RBC: 3 MIL/uL — ABNORMAL LOW (ref 4.22–5.81)
RDW: 15.7 % — ABNORMAL HIGH (ref 11.5–15.5)
RDW: 15.8 % — ABNORMAL HIGH (ref 11.5–15.5)
WBC: 5.5 10*3/uL (ref 4.0–10.5)
nRBC: 0 % (ref 0.0–0.2)

## 2023-01-21 LAB — HEPATITIS PANEL, ACUTE
HCV Ab: NONREACTIVE
Hep A IgM: NONREACTIVE
Hep B C IgM: NONREACTIVE
Hepatitis B Surface Ag: NONREACTIVE

## 2023-01-21 LAB — TYPE AND SCREEN: Unit division: 0

## 2023-01-21 MED ORDER — SENNOSIDES-DOCUSATE SODIUM 8.6-50 MG PO TABS
1.0000 | ORAL_TABLET | Freq: Once | ORAL | Status: AC
  Administered 2023-01-21: 1 via ORAL
  Filled 2023-01-21: qty 1

## 2023-01-21 NOTE — Care Management Important Message (Signed)
Important Message  Patient Details  Name: Lucas Bowman MRN: [id number] Date of Birth: Jan 20, 1951   Medicare Important Message Given:  Yes     [name] 01/21/2023, 1:44 PM

## 2023-01-21 NOTE — Plan of Care (Signed)
  Problem: Education: Goal: Knowledge of General Education information will improve Description: Including pain rating scale, medication(s)/side effects and non-pharmacologic comfort measures Outcome: Progressing   Problem: Health Behavior/Discharge Planning: Goal: Ability to manage health-related needs will improve Outcome: Progressing   Problem: Clinical Measurements: Goal: Ability to maintain clinical measurements within normal limits will improve Outcome: Not Progressing Goal: Diagnostic test results will improve Outcome: Not Progressing

## 2023-01-21 NOTE — Progress Notes (Signed)
Progress Note  Patient Name: Lucas Bowman Date of Encounter: 01/21/2023  [facility] Cardiologist: None   Subjective   Admitted with GI Bleed with h/o chronic afib and mechanical AVR on chronic warfarin.  Now off anticoagulation.  GI recommended holding coumadin and let INR drift down to plan EGD/Colon once INR<1.5.  Hbg 7.5>>8.7 today  INR 1.9.    No CP or SOB this am. No further palpitations.  HR controlled in afib in the 80's  Inpatient Medications    Scheduled Meds:  allopurinol  300 mg Oral Daily   carvedilol  6.25 mg Oral BID WC   DULoxetine  30 mg Oral Daily   loratadine  10 mg Oral Daily   multivitamin with minerals  1 tablet Oral Daily   traZODone  100 mg Oral QHS   Continuous Infusions:  pantoprazole 8 mg/hr (01/20/23 2249)   PRN Meds: acetaminophen **OR** acetaminophen, albuterol, cyclobenzaprine, HYDROcodone-acetaminophen, ondansetron **OR** ondansetron (ZOFRAN) IV   Vital Signs    Vitals:   01/20/23 2000 01/20/23 2305 01/21/23 0316 01/21/23 0744  BP: (!) 97/53 106/75 102/67 128/75  Pulse: 84 83 81 80  Resp: 19 18 13 18   Temp: 98.4 F (36.9 C) 98.5 F (36.9 C) 98.2 F (36.8 C) (!) 97.5 F (36.4 C)  TempSrc: Oral Oral Oral Axillary  SpO2: 100%  90% 100%  Weight:      Height:        Intake/Output Summary (Last 24 hours) at 01/21/2023 0911 Last data filed at 01/21/2023 0500 Gross per 24 hour  Intake 873.35 ml  Output --  Net 873.35 ml      01/19/2023    6:27 PM 01/18/2023    6:33 PM 03/08/2019    2:38 PM  Last 3 Weights  Weight (lbs) 166 lb 14.2 oz 170 lb 172 lb 9.6 oz  Weight (kg) 75.7 kg 77.111 kg 78.291 kg      Telemetry    Atrial fibrillation with CVR - Personally Reviewed  ECG    No new EKG to review - Personally Reviewed  Physical Exam  GEN: Well nourished, well developed in no acute distress HEENT: Normal NECK: No JVD; No carotid bruits LYMPHATICS: No lymphadenopathy CARDIAC:RRR, no murmurs, rubs, gallops.  Crisp  mechanical heart sounds RESPIRATORY:  Clear to auscultation without rales, wheezing or rhonchi  ABDOMEN: Soft, non-tender, non-distended MUSCULOSKELETAL:  No edema; No deformity  SKIN: Warm and dry NEUROLOGIC:  Alert and oriented x 3 PSYCHIATRIC:  Normal affect  Labs    High Sensitivity Troponin:  No results for input(s): "TROPONINIHS" in the last 720 hours.    Chemistry Recent Labs  Lab 01/18/23 1845 01/18/23 1851 01/19/23 0730 01/20/23 0621 01/21/23 0538  NA 134*   < > 138 139 139  K 4.0   < > 3.7 3.8 3.7  CL 101   < > 99 105 100  CO2 26  --  25 26 28   GLUCOSE 108*   < > 108* 97 100*  BUN 29*   < > 23 16 10   CREATININE 1.11   < > 1.09 0.93 0.85  CALCIUM 8.4*  --  8.7* 8.5* 9.1  PROT 5.9*  --  5.6*  --   --   ALBUMIN 3.4*  --  3.3*  --   --   AST 22  --  22  --   --   ALT 13  --  14  --   --   ALKPHOS 50  --  46  --   --   BILITOT 0.2*  --  0.9  --   --   GFRNONAA >60  --  >60 >60 >60  ANIONGAP 7  --  14 8 11    < > = values in this interval not displayed.     Hematology Recent Labs  Lab 01/18/23 1845 01/18/23 1851 01/20/23 1426 01/20/23 2009 01/21/23 0538  WBC 7.2  --   --   --  5.2  RBC 1.79*  --   --   --  3.20*  HGB 4.9*   < > 8.7* 7.8* 8.7*  HCT 15.5*   < > 28.0* 24.7* 28.0*  MCV 86.6  --   --   --  87.5  MCH 27.4  --   --   --  27.2  MCHC 31.6  --   --   --  31.1  RDW 16.1*  --   --   --  15.7*  PLT 198  --   --   --  183   < > = values in this interval not displayed.    BNPNo results for input(s): "BNP", "PROBNP" in the last 168 hours.   DDimer No results for input(s): "DDIMER" in the last 168 hours.   Radiology    No results found.  Patient Profile     72 y.o. male  with permanent atrial fibrillation, history of mechanical aortic valve replacement (on chronic warfarin), history of ascending aortic aneurysm s/p mesh repair, mitral valve repair, HTN, HLD, diverticulosis, and GERD/gastritis who is being seen 01/19/2023 for the evaluation of  anticoagulation management in the setting of acute GIB at the request of internal medicine.   Assessment & Plan     Acute GI bleed -GI following and plans for EGD/colonoscopy once INR<1.5 -Hbg 7.5 >>8.7 today -BP stable at 128/37mmHg today  2.  Chronic atrial fibrillation -CHADS2VASC score 2 (Age>65/HTN)  -warfarin on hold due to GIB -his home does of carvedilol is 6.25mg  BID but was only placed on 3.125mg  BID due to concern for soft BP in the setting of GIB and HR went up into the 120's in afib>>HR also likely being driven by low Hbg -Carvedilol increased back to 6.25mg  BID yesterday and HR well controlled  3.  Mechanical AVR -on chronic coumadin which is on hold for GIB -INR 1.9 today -waiting for INR to get <1.5 prior to endoscopy -has crip mechanical heart sounds on exam  4.  HTN -BP controlled at 128/75mmHg today -continue Carvedilol 6.25mg  BID -Home Lotensin on hold        For questions or updates, please contact [facility] Please consult [email or web address] for contact info under        Signed, Armanda Magic, MD  01/21/2023, 9:11 AM

## 2023-01-21 NOTE — TOC CM/SW Note (Signed)
Transition of Care [facility]) - Inpatient Brief Assessment   Patient Details  Name: Lucas Bowman MRN: [id number] Date of Birth: 1951-05-09  Transition of Care [facility]) CM/SW Contact:    Gordy Clement, RN Phone Number: 01/21/2023, 4:40 PM   Clinical Narrative:  Patient from home with Wife. Patient has Medicare A and B insurance and does have a PCP. PT and OT evals are pending.  Patient has had [facility] in the past.  TOC will continue to follow patient for any additional discharge needs        Transition of Care Asessment: Insurance and Status: Insurance coverage has been reviewed (Medicare A and B)   Home environment has been reviewed: From home with Wife Prior level of function:: independent Prior/Current Home Services: Current home services Social Determinants of Health Reivew: SDOH reviewed no interventions necessary Readmission risk has been reviewed: Yes (12%) Transition of care needs: no transition of care needs at this time

## 2023-01-21 NOTE — Plan of Care (Signed)

## 2023-01-21 NOTE — Progress Notes (Signed)
PROGRESS NOTE    Lucas Bowman  MWN:[id number] DOB: [birth year] DOA: 01/18/2023 PCP: Kathlee Nations, MD   Chief Complaint  Patient presents with   Emesis   Diarrhea    Brief Narrative:    Lucas Bowman is a 72 y.o. male with medical history significant of Mechanical AVR, mitral valve repair / annuloplasty in 2001, A.Fib, on chronic coumadin for mechanical valve, HTN.  - Pt in to ED with onset of GIB with hematemesis ,  Maroon stools , Has had presyncope with this and fatigue.with h/o chronic afib and mechanical AVR on chronic warfarin , seen by GI,GI recommended holding coumadin and let INR drift down to plan EGD/Colon once INR<1.5.  [name] remains 1.9 today, received total 4 units PRBC during hospital stay.    Assessment & Plan:   Principal Problem:   UGIB (upper gastrointestinal bleed) Active Problems:   ABLA (acute blood loss anemia)   HEART VALVE REPLACEMENT, HX OF   Acute pancreatitis   Chronic a-fib (HCC)   Essential hypertension   Cirrhosis (HCC)   MCI (mild cognitive impairment)   Current use of long term anticoagulation   H/O mechanical aortic valve replacement   Symptomatic anemia    UGIB (upper gastrointestinal bleed) Acute blood loss anemia - new diagnosis of cirrhosis, GI input greatly appreciated, plan for endoscopy once INR less than 1.5. -presents with hemoglobin 4.9 on admission, received 4 units, stable this morning at 8.7. -Continue to hold warfarin. -Continue to monitor CBC closely and transfuse as needed -Dense of cirrhosis on imaging, will check hepatitis panel.   Chronic a-fib (HCC) -Warfarin on hold, INR is 1.9  - Back on coreg as BP improved today.  Cirrhosis on CTA Normal LFTs No previous abdominal imaging. Denies alcohol use.   S/p mechanical AVR and mitral valve repair on Coumadin PT 28.6/INR 2.7 on admission.  Down to 1.9 today, given his aortic valve, will hold on heparin gtt. over the next 24 hours, can start without bolus if  hemoglobin remained stable.   Patient with finding of abdominal pancreatitis on imaging - Question of acute pancreatitis. - Being called on imaging study; however, Lipase normal, only having minimal epigastric discomfort. - No EtOH intake - H/o cholecystectomy in past  History of mechanical heart valve replacement - [employer] mechanical AVR. -Warfarin remains on hold, can start heparin gtt. once INR less than 2.  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 -Morning within normal limits    Essential hypertension Blood pressure initially soft, now improved, back on Coreg, continue to hold Lotensin    DVT prophylaxis: INR is 2  Code Status: Full Family Communication: D/W son by phone Disposition:   Status is: Inpatient  Consultants:  Cardiology GI   Subjective:  No significant events overnight, he denies any complaints today.  No bowel movements yesterday.  Objective: Vitals:   01/20/23 2305 01/21/23 0316 01/21/23 0744 01/21/23 1200  BP: 106/75 102/67 128/75 111/76  Pulse: 83 81 80 89  Resp: 18 13 18  (!) 23  Temp: 98.5 F (36.9 C) 98.2 F (36.8 C) (!) 97.5 F (36.4 C) 98.5 F (36.9 C)  TempSrc: Oral Oral Axillary Oral  SpO2:  90% 100% 92%  Weight:      Height:        Intake/Output Summary (Last 24 hours) at 01/21/2023 1411 Last data filed at 01/21/2023  0500 Gross per 24 hour  Intake 535.35 ml  Output --  Net 535.35 ml   Filed Weights   01/18/23 1833 01/19/23 1827  Weight: 77.1 kg 75.7 kg    Examination:  Awake Alert, Oriented X 3, No new F.N deficits, Normal affect Symmetrical Chest wall movement, Good air movement bilaterally, CTAB Regular, mechanical click present +ve B.Sounds, Abd Soft, No tenderness, No rebound - guarding or rigidity. No Cyanosis, Clubbing or edema, No new Rash or bruise      Data Reviewed:  I have personally reviewed following labs and imaging studies  CBC: Recent Labs  Lab 01/18/23 1845 01/18/23 1851 01/20/23 0002 01/20/23 0621 01/20/23 1426 01/20/23 2009 01/21/23 0538  WBC 7.2  --   --   --   --   --  5.2  NEUTROABS 4.7  --   --   --   --   --   --   HGB 4.9*   < > 7.7* 7.5* 8.7* 7.8* 8.7*  HCT 15.5*   < > 23.8* 23.0* 28.0* 24.7* 28.0*  MCV 86.6  --   --   --   --   --  87.5  PLT 198  --   --   --   --   --  183   < > = values in this interval not displayed.    Basic Metabolic Panel: Recent Labs  Lab 01/18/23 1845 01/18/23 1851 01/19/23 0730 01/20/23 0621 01/21/23 0538  NA 134* 136 138 139 139  K 4.0 4.1 3.7 3.8 3.7  CL 101 101 99 105 100  CO2 26  --  25 26 28   GLUCOSE 108* 104* 108* 97 100*  BUN 29* 30* 23 16 10   CREATININE 1.11 1.20 1.09 0.93 0.85  CALCIUM 8.4*  --  8.7* 8.5* 9.1    GFR: Estimated Creatinine Clearance: 74.9 mL/min (by C-G formula based on SCr of 0.85 mg/dL).  Liver Function Tests: Recent Labs  Lab 01/18/23 1845 01/19/23 0730  AST 22 22  ALT 13 14  ALKPHOS 50 46  BILITOT 0.2* 0.9  PROT 5.9* 5.6*  ALBUMIN 3.4* 3.3*    CBG: No results for input(s): "GLUCAP" in the last 168 hours.   No results found for this or any previous visit (from the past 240 hour(s)).       Radiology Studies: No results found.      Scheduled Meds:  allopurinol  300 mg Oral Daily   carvedilol  6.25 mg Oral BID WC   DULoxetine  30 mg Oral Daily   loratadine  10 mg Oral Daily   multivitamin with minerals  1 tablet Oral Daily   traZODone  100 mg Oral QHS   Continuous Infusions:  pantoprazole 8 mg/hr (01/21/23 1019)     LOS: 2 days     Huey Bienenstock, MD [facility]   To contact the attending provider between 7A-7P or the covering provider during after hours 7P-7A, please log into the web site [email or web address] and access using universal [place] password for that web site. If you do not have the password, please call  the hospital operator.  01/21/2023, 2:11 PM

## 2023-01-21 NOTE — Progress Notes (Signed)
Progress Note   LOS: 2 days   Chief Complaint:GI bleed   Subjective   No further evidence of bleeding. Tolerating full liquids without difficulty and would like to advance diet if possible.  Denies nausea, vomiting.  Reports some mild lower abdominal discomfort   Objective   Vital signs in last 24 hours: Temp:  [97.5 F (36.4 C)-98.5 F (36.9 C)] 97.5 F (36.4 C) (07/26 0744) Pulse Rate:  [80-96] 80 (07/26 0744) Resp:  [13-21] 18 (07/26 0744) BP: (97-128)/(53-81) 128/75 (07/26 0744) SpO2:  [90 %-100 %] 100 % (07/26 0744) Last BM Date : 01/18/23 Last BM recorded by nurses in past 5 days No data recorded  General:   male in no acute distress  Heart: Irregular rhythm, regular rate Pulm: Clear anteriorly; no wheezing Abdomen: soft, nondistended, normal bowel sounds in all quadrants. Nontender without guarding. No organomegaly appreciated. Extremities:  No edema Neurologic:  Alert and  oriented x4;  No focal deficits.  Psych:  Cooperative. Normal mood and affect.  Intake/Output from previous day: 07/25 0701 - 07/26 0700 In: 873.4 [I.V.:565.4; Blood:308] Out: -  Intake/Output this shift: No intake/output data recorded.  Studies/Results: No results found.  Lab Results: Recent Labs    01/18/23 1845 01/18/23 1851 01/20/23 1426 01/20/23 2009 01/21/23 0538  WBC 7.2  --   --   --  5.2  HGB 4.9*   < > 8.7* 7.8* 8.7*  HCT 15.5*   < > 28.0* 24.7* 28.0*  PLT 198  --   --   --  183   < > = values in this interval not displayed.   BMET Recent Labs    01/19/23 0730 01/20/23 0621 01/21/23 0538  NA 138 139 139  K 3.7 3.8 3.7  CL 99 105 100  CO2 25 26 28   GLUCOSE 108* 97 100*  BUN 23 16 10   CREATININE 1.09 0.93 0.85  CALCIUM 8.7* 8.5* 9.1   LFT Recent Labs    01/19/23 0730  PROT 5.6*  ALBUMIN 3.3*  AST 22  ALT 14  ALKPHOS 46  BILITOT 0.9   PT/INR Recent Labs    01/20/23 1104 01/21/23 0538  LABPROT 22.5* 21.7*  INR 2.0* 1.9*     Scheduled  Meds:  allopurinol  300 mg Oral Daily   carvedilol  6.25 mg Oral BID WC   DULoxetine  30 mg Oral Daily   loratadine  10 mg Oral Daily   multivitamin with minerals  1 tablet Oral Daily   traZODone  100 mg Oral QHS   Continuous Infusions:  pantoprazole 8 mg/hr (01/20/23 2249)     Impression./Plan   72 year old male history of prosthetic aortic valve on Coumadin presenting with symptomatic anemia and initial Hgb of 4.9 in the setting of melena and questionable hematemesis with CTA showing inflammatory changes in the upper abdomen around the duodenal C-loop.  With intermittent NSAID use suspicious for duodenitis, PUD, gastritis, esophagitis, or even varices with new finding of cirrhosis on CTA (less likely varices).  History of H. pylori negative gastritis on EGD in 2015.  Hgb 8.7, improving BUN 10, creatinine 0.85, improving INR 1.9 Continuing to wait for INR to become closer to 1.5 prior to EGD/colonoscopy.  INR is currently improving.  Suspect we can do procedure possibly Sunday or Monday pending INR and anesthesia availability -Continue daily CBC and transfuse as needed to maintain HGB > 7  -Continue to hold Coumadin, appreciate cardiology recommendations -Continue daily INR -Since INR is  less than 2 can consider heparin drip  Cirrhosis on CTA Normal LFTs No previous abdominal imaging. Denies alcohol use.   S/p mechanical AVR and mitral valve repair on Coumadin PT 28.6/INR 2.7 initally   Aneurysm of ascending aorta   Persistent afib  Lucas Bowman  01/21/2023, 9:50 AM

## 2023-01-22 DIAGNOSIS — K922 Gastrointestinal hemorrhage, unspecified: Secondary | ICD-10-CM | POA: Diagnosis not present

## 2023-01-22 LAB — HEPARIN LEVEL (UNFRACTIONATED): Heparin Unfractionated: 0.24 IU/mL — ABNORMAL LOW (ref 0.30–0.70)

## 2023-01-22 MED ORDER — PEG 3350-KCL-NA BICARB-NACL 420 G PO SOLR
4000.0000 mL | Freq: Once | ORAL | Status: AC
  Administered 2023-01-22: 4000 mL via ORAL
  Filled 2023-01-22: qty 4000

## 2023-01-22 MED ORDER — HEPARIN (PORCINE) 25000 UT/250ML-% IV SOLN: 1250.0000 [IU]/h | INTRAVENOUS | Status: AC

## 2023-01-22 MED ORDER — HEPARIN (PORCINE) 25000 UT/250ML-% IV SOLN
1100.0000 [IU]/h | INTRAVENOUS | Status: DC
  Administered 2023-01-22: 1100 [IU]/h via INTRAVENOUS
  Filled 2023-01-22: qty 250

## 2023-01-22 MED ORDER — PANTOPRAZOLE SODIUM 40 MG PO TBEC
40.0000 mg | DELAYED_RELEASE_TABLET | Freq: Every day | ORAL | Status: DC
  Administered 2023-01-22 – 2023-01-25 (×4): 40 mg via ORAL
  Filled 2023-01-22 (×4): qty 1

## 2023-01-22 MED ORDER — HYDROMORPHONE HCL 1 MG/ML IJ SOLN
1.0000 mg | Freq: Four times a day (QID) | INTRAMUSCULAR | Status: DC | PRN
  Administered 2023-01-22 – 2023-01-24 (×4): 1 mg via INTRAVENOUS
  Filled 2023-01-22 (×4): qty 1

## 2023-01-22 NOTE — Progress Notes (Signed)
ANTICOAGULATION CONSULT NOTE - Initial Consult  Pharmacy Consult for heparin  Indication: atrial fibrillation/mechanical valve  Allergies  Allergen Reactions   Other     Pneumonia shot-redness, swelling   Sulfonamide Derivatives Other (See Comments)    Patient Measurements: Height: 5\' 8"  (172.7 cm) Weight: 75.7 kg (166 lb 14.2 oz) IBW/kg (Calculated) : 68.4 Heparin Dosing Weight: 75.7 kg  Vital Signs: Temp: 98.4 F (36.9 C) (07/27 0333) Temp Source: Oral (07/27 0333) BP: 121/86 (07/27 0333) Pulse Rate: 96 (07/27 0333)  Labs: Recent Labs    01/20/23 0621 01/20/23 1104 01/20/23 1426 01/21/23 0538 01/21/23 1604 01/22/23 0549  HGB 7.5*  --    < > 8.7* 8.4* 8.0*  HCT 23.0*  --    < > 28.0* 26.6* 24.7*  PLT  --   --   --  183 182 190  LABPROT  --  22.5*  --  21.7*  --  18.9*  INR  --  2.0*  --  1.9*  --  1.6*  CREATININE 0.93  --   --  0.85  --  0.75   < > = values in this interval not displayed.    Estimated Creatinine Clearance: 79.6 mL/min (by C-G formula based on SCr of 0.75 mg/dL).   Medical History: Past Medical History:  Diagnosis Date   Anemia    Depression    Gastritis    GERD (gastroesophageal reflux disease)    Gout    H/O aortic valve replacement    Hemorrhoids    Hiatal hernia    Internal hemorrhoids    Irritable bowel syndrome    Mitral insufficiency    Neck pain    Septic shock (HCC) 03/06/2009   Tubular adenoma     Medications:  Scheduled:   allopurinol  300 mg Oral Daily   carvedilol  6.25 mg Oral BID WC   DULoxetine  30 mg Oral Daily   loratadine  10 mg Oral Daily   multivitamin with minerals  1 tablet Oral Daily   traZODone  100 mg Oral QHS   Infusions:  PRN: acetaminophen **OR** acetaminophen, albuterol, cyclobenzaprine, HYDROcodone-acetaminophen, ondansetron **OR** ondansetron (ZOFRAN) IV  Assessment: KD, is a 72 YO M, presenting with an upper GI bleed with a PMH of a mechanical AVR, mitral valve repair / annuloplasty in 2001,  A.Fib (on warfarin), and HTN. Patient's last warfarin dose was on 7/22 and anticoagulation therapy has been held until patient's INR < 2. Pts current INR is 1.6 (7/27). Pts hgb and plts are stable and improved since admission with no additional S/sx of bleeding. It is appropriate to initiate heparin at this time.   Goal of Therapy:  Heparin level 0.3-0.7 units/ml Monitor platelets by anticoagulation protocol: Yes   Plan:  Start heparin infusion at 1,100 units/hr Check anti-Xa level in 8 hours and daily while on heparin Continue to monitor H&H and platelets  Roslyn Smiling, PharmD PGY1 Pharmacy Resident 01/22/2023 8:10 AM

## 2023-01-22 NOTE — Evaluation (Signed)
Physical Therapy Evaluation Patient Details Name: Lucas Bowman MRN: [id number] DOB: [birth year] Today's Date: 01/22/2023  History of Present Illness  72 y.o. male presents to [facility] hospital on 01/18/2023 with GIB with hematemesis, maroon stool and presyncopal symptoms. Pt diagnosed with cirrhosis and acute pancreatitis. PMH includes mechanical AVR, afib, HTN.  Clinical Impression  Pt presents to PT with deficits in gait, balance, power, endurance. Pt is an unreliable historian due to cognitive deficits, however based on his reports he is below his baseline. Pt reports reduced tolerance for ambulation and slowed gait speed. Pt demonstrates fair balance when mobilizing, not requiring physical assistance. Pt reports lightheadedness when ambulating, although BP stable. Pt will benefit from continued acute PT services in an effort to restore independence and prior level of function. PT recommends discharge home when medically appropriate.        Assistance Recommended at Discharge Intermittent Supervision/Assistance  If plan is discharge home, recommend the following:  Can travel by private vehicle  A little help with bathing/dressing/bathroom;Assistance with cooking/housework;Direct supervision/assist for medications management;Direct supervision/assist for financial management;Assist for transportation        Equipment Recommendations None recommended by PT  Recommendations for Other Services       Functional Status Assessment Patient has had a recent decline in their functional status and demonstrates the ability to make significant improvements in function in a reasonable and predictable amount of time.     Precautions / Restrictions Precautions Precautions: Fall Precaution Comments: cognitive impairment Restrictions Weight Bearing Restrictions: No      Mobility  Bed Mobility                    Transfers Overall transfer level: Needs assistance Equipment used:  None Transfers: Sit to/from Stand Sit to Stand: Supervision                Ambulation/Gait Ambulation/Gait assistance: Supervision Gait Distance (Feet): 250 Feet Assistive device: None Gait Pattern/deviations: Step-through pattern Gait velocity: functional Gait velocity interpretation: 1.31 - 2.62 ft/sec, indicative of limited community ambulator   General Gait Details: slowed step-through gait  Stairs            Wheelchair Mobility     Tilt Bed    Modified Rankin (Stroke Patients Only)       Balance Overall balance assessment: Needs assistance Sitting-balance support: No upper extremity supported, Feet supported Sitting balance-Leahy Scale: Good     Standing balance support: No upper extremity supported, During functional activity Standing balance-Leahy Scale: Fair                               Pertinent Vitals/Pain Pain Assessment Pain Assessment: 0-10 Pain Score: 6  Pain Location: shoulder and neck Pain Descriptors / Indicators: Sore Pain Intervention(s): Monitored during session    Home Living Family/patient expects to be discharged to:: Private residence Living Arrangements: Spouse/significant other Available Help at Discharge: Family;Available 24 hours/day Type of Home: House Home Access: Ramped entrance       Home Layout: One level Home Equipment: Agricultural consultant (2 wheels) Additional Comments: pt has difficulty accuratley reporting available DME    Prior Function Prior Level of Function : Independent/Modified Independent             Mobility Comments: one recent fall, otherwise independent ADLs Comments: pt reports some difficulty with bathing, seems to report that his R shoulder dislocates frequently when he is attempting to bathe himself  Hand Dominance   Dominant Hand: Right    Extremity/Trunk Assessment   Upper Extremity Assessment Upper Extremity Assessment: Defer to OT evaluation RUE Deficits /  Details: shoulder pain at baseline, [facility] for tasks assessed strength 4/, sensation intact, coordination wnl LUE Deficits / Details: shoulder pain at baseline, [facility] for tasks assessed strength 4/, sensation intact, coordination wnl    Lower Extremity Assessment Lower Extremity Assessment: Generalized weakness    Cervical / Trunk Assessment Cervical / Trunk Assessment: Normal  Communication   Communication: No difficulties  Cognition Arousal/Alertness: Awake/alert Behavior During Therapy: WFL for tasks assessed/performed Overall Cognitive Status: History of cognitive impairments - at baseline                                 General Comments: tangential thought, slowed processing and problem solving, Poor attention.        General Comments General comments (skin integrity, edema, etc.): VSS on RA, pt reports mild lightheadness when ambulating, BP 120/64 at end of gait assessment    Exercises     Assessment/Plan    PT Assessment Patient needs continued PT services  PT Problem List Decreased strength;Decreased activity tolerance;Decreased balance;Decreased mobility;Decreased knowledge of use of DME;Pain       PT Treatment Interventions DME instruction;Gait training;Functional mobility training;Therapeutic activities;Therapeutic exercise;Neuromuscular re-education;Balance training;Patient/family education    PT Goals (Current goals can be found in the Care Plan section)  Acute Rehab PT Goals Patient Stated Goal: to go home PT Goal Formulation: With patient Time For Goal Achievement: 02/05/23 Potential to Achieve Goals: Fair Additional Goals Additional Goal #1: Pt will score >19/24 on the DGI to indicate a reduced risk for falls    Frequency Min 1X/week     Co-evaluation               AM-PAC PT "6 Clicks" Mobility  Outcome Measure Help needed turning from your back to your side while in a flat bed without using bedrails?: A Little Help needed moving  from lying on your back to sitting on the side of a flat bed without using bedrails?: A Little Help needed moving to and from a bed to a chair (including a wheelchair)?: A Little Help needed standing up from a chair using your arms (e.g., wheelchair or bedside chair)?: A Little Help needed to walk in hospital room?: A Little Help needed climbing 3-5 steps with a railing? : A Little 6 Click Score: 18    End of Session Equipment Utilized During Treatment: Gait belt Activity Tolerance: Patient tolerated treatment well Patient left: in chair;with call bell/phone within reach;with chair alarm set Nurse Communication: Mobility status PT Visit Diagnosis: Other abnormalities of gait and mobility (R26.89);Muscle weakness (generalized) (M62.81)    Time: 1000-1030 PT Time Calculation (min) (ACUTE ONLY): 30 min   Charges:   PT Evaluation $PT Eval Low Complexity: 1 Low   PT General Charges $$ ACUTE PT VISIT: 1 Visit         Arlyss Gandy, PT, DPT Acute Rehabilitation Office [phone]   Arlyss Gandy 01/22/2023, 10:55 AM

## 2023-01-22 NOTE — Evaluation (Signed)
Occupational Therapy Evaluation Patient Details Name: Lucas Bowman MRN: [id number] DOB: 1951/06/04 Today's Date: 01/22/2023   History of Present Illness 72 y.o. male presents to [facility] hospital on 01/18/2023 with GIB with hematemesis, maroon stool and presyncopal symptoms. Pt diagnosed with cirrhosis and acute pancreatitis. PMH includes mechanical AVR, afib, HTN.   Clinical Impression   PTA, pt was living at home with his wife, he reports he was independent with ADL/IADL and functional mobility without AD. Pt reports he was still driving and independent with medication management. Pt currently requires minguard assistance with functional mobility during ADL completion. He demonstrates cognitive limitations impacting safety with ADL/IADL. Pt reports hx of cognitive limitations at baseline, unsure how close to baseline pt is currently. He required extended time to order his breakfast and scored an 18/24 on the short blessed test. A score greater than 10 indicates impairment consistent with dementia. Due to decline in current level of function, pt would benefit from acute OT to address established goals to facilitate safe D/C to venue listed below. At this time, recommend HHOT with assistance for driving, medication management, meal preparation, finances and functional mobility. Will continue to follow acutely.       Recommendations for follow up therapy are one component of a multi-disciplinary discharge planning process, led by the attending physician.  Recommendations may be updated based on patient status, additional functional criteria and insurance authorization.   Assistance Recommended at Discharge Intermittent Supervision/Assistance  Patient can return home with the following A little help with walking and/or transfers;A little help with bathing/dressing/bathroom;Assist for transportation;Direct supervision/assist for financial management;Direct supervision/assist for medications management     Functional Status Assessment  Patient has had a recent decline in their functional status and demonstrates the ability to make significant improvements in function in a reasonable and predictable amount of time.  Equipment Recommendations  None recommended by OT    Recommendations for Other Services       Precautions / Restrictions Precautions Precautions: Fall Restrictions Weight Bearing Restrictions: No      Mobility Bed Mobility Overal bed mobility: Needs Assistance Bed Mobility: Supine to Sit     Supine to sit: Supervision     General bed mobility comments: supervision for safety pt with increased time and effort    Transfers Overall transfer level: Needs assistance Equipment used: None Transfers: Sit to/from Stand Sit to Stand: Min guard           General transfer comment: minguard for safety      Balance Overall balance assessment: Mild deficits observed, not formally tested                                         ADL either performed or assessed with clinical judgement   ADL Overall ADL's : Needs assistance/impaired Eating/Feeding: Set up;Sitting   Grooming: Min guard;Standing   Upper Body Bathing: Set up;Sitting   Lower Body Bathing: Min guard;Sit to/from stand   Upper Body Dressing : Set up;Sitting   Lower Body Dressing: Min guard;Sit to/from stand   Toilet Transfer: Min guard;Ambulation   Toileting- Clothing Manipulation and Hygiene: Min guard;Sit to/from stand       Functional mobility during ADLs: Min guard General ADL Comments: minguard for safety and functional mobility     Vision Baseline Vision/History: 1 Wears glasses Ability to See in Adequate Light: 1 Impaired Patient Visual Report: No change from  baseline       Perception     Praxis      Pertinent Vitals/Pain Pain Assessment Pain Assessment: 0-10 Pain Score: 6  Pain Location: all chronic pain R shoulder, neck, back Pain Descriptors /  Indicators: Sore Pain Intervention(s): Limited activity within patient's tolerance, Monitored during session     Hand Dominance Right   Extremity/Trunk Assessment Upper Extremity Assessment Upper Extremity Assessment: RUE deficits/detail;LUE deficits/detail RUE Deficits / Details: shoulder pain at baseline, [facility] for tasks assessed strength 4/, sensation intact, coordination wnl LUE Deficits / Details: shoulder pain at baseline, [facility] for tasks assessed strength 4/, sensation intact, coordination wnl   Lower Extremity Assessment Lower Extremity Assessment: Defer to PT evaluation   Cervical / Trunk Assessment Cervical / Trunk Assessment: Normal   Communication Communication Communication: No difficulties   Cognition Arousal/Alertness: Awake/alert Behavior During Therapy: WFL for tasks assessed/performed Overall Cognitive Status: No family/caregiver present to determine baseline cognitive functioning                                 General Comments: Pt with tangential thinking, reports he has cognitive limitations at baseline. Pt scored 18/24 on short blessed test indicating impairment consistent with dementia. Pt demonstrated difficulty with processing, problem solving, working memory, short term memory, attention. discussed importance of having family assist with medication management and other IADL;pt required extended time to order food     General Comments  pt in A-fib throughout session (per chart, chronic afib) otherwise VSS on RA    Exercises     Shoulder Instructions      Home Living Family/patient expects to be discharged to:: Private residence Living Arrangements: Spouse/significant other Available Help at Discharge: Family;Available 24 hours/day Type of Home: House Home Access: Level entry     Home Layout: One level     Bathroom Shower/Tub: Chief Strategy Officer: Standard     Home Equipment: Agricultural consultant (2 wheels)           Prior Functioning/Environment Prior Level of Function : Independent/Modified Independent             Mobility Comments: independent 1 fall, ADLs Comments: independent with ADL, driving, grocery shopping, manages medications        OT Problem List: Decreased activity tolerance;Impaired balance (sitting and/or standing);Decreased safety awareness;Decreased cognition      OT Treatment/Interventions: Self-care/ADL training;Therapeutic exercise;Energy conservation;DME and/or AE instruction;Cognitive remediation/compensation;Patient/family education;Balance training    OT Goals(Current goals can be found in the care plan section) Acute Rehab OT Goals Patient Stated Goal: to go home OT Goal Formulation: With patient Time For Goal Achievement: 02/05/23 Potential to Achieve Goals: Good ADL Goals Pt Will Perform Grooming: with modified independence;standing Pt Will Transfer to Toilet: with modified independence;ambulating Additional ADL Goal #1: Pt will complete multistep cognitive task with <3errors with minimal visual/verbal cues.  OT Frequency: Min 2X/week    Co-evaluation              AM-PAC OT "6 Clicks" Daily Activity     Outcome Measure Help from another person eating meals?: A Little Help from another person taking care of personal grooming?: A Little Help from another person toileting, which includes using toliet, bedpan, or urinal?: A Little Help from another person bathing (including washing, rinsing, drying)?: A Little Help from another person to put on and taking off regular upper body clothing?: A Little Help from another person to  put on and taking off regular lower body clothing?: A Little 6 Click Score: 18   End of Session Equipment Utilized During Treatment: Gait belt Nurse Communication: Mobility status  Activity Tolerance: Patient tolerated treatment well Patient left: in chair;with call bell/phone within reach;with chair alarm set  OT Visit Diagnosis:  Other abnormalities of gait and mobility (R26.89);Muscle weakness (generalized) (M62.81);Other symptoms and signs involving cognitive function                Time: 1478-2956 OT Time Calculation (min): 35 min Charges:  OT General Charges $OT Visit: 1 Visit OT Evaluation $OT Eval Moderate Complexity: 1 Mod OT Treatments $Self Care/Home Management : 8-22 mins  Rosey Bath OTR/L Acute Rehabilitation Services Office: [phone]   Rebeca Alert 01/22/2023, 10:37 AM

## 2023-01-22 NOTE — Progress Notes (Signed)
ANTICOAGULATION CONSULT NOTE   Pharmacy Consult for heparin  Indication: atrial fibrillation/mechanical valve  Allergies  Allergen Reactions   Other     Pneumonia shot-redness, swelling   Sulfonamide Derivatives Other (See Comments)    Patient Measurements: Height: 5\' 8"  (172.7 cm) Weight: 75.7 kg (166 lb 14.2 oz) IBW/kg (Calculated) : 68.4 Heparin Dosing Weight: 75.7 kg  Vital Signs: Temp: 97.5 F (36.4 C) (07/27 0915) Temp Source: Oral (07/27 1200) BP: 123/63 (07/27 1200)  Labs: Recent Labs    01/20/23 0621 01/20/23 1104 01/20/23 1426 01/21/23 0538 01/21/23 1604 01/22/23 0549 01/22/23 1722  HGB 7.5*  --    < > 8.7* 8.4* 8.0* 8.3*  HCT 23.0*  --    < > 28.0* 26.6* 24.7* 26.2*  PLT  --   --    < > 183 182 190 198  LABPROT  --  22.5*  --  21.7*  --  18.9*  --   INR  --  2.0*  --  1.9*  --  1.6*  --   HEPARINUNFRC  --   --   --   --   --   --  0.24*  CREATININE 0.93  --   --  0.85  --  0.75  --    < > = values in this interval not displayed.    Estimated Creatinine Clearance: 79.6 mL/min (by C-G formula based on SCr of 0.75 mg/dL).   Medical History: Past Medical History:  Diagnosis Date   Anemia    Depression    Gastritis    GERD (gastroesophageal reflux disease)    Gout    H/O aortic valve replacement    Hemorrhoids    Hiatal hernia    Internal hemorrhoids    Irritable bowel syndrome    Mitral insufficiency    Neck pain    Septic shock (HCC) 03/06/2009   Tubular adenoma     Medications:  Scheduled:   allopurinol  300 mg Oral Daily   carvedilol  6.25 mg Oral BID WC   DULoxetine  30 mg Oral Daily   loratadine  10 mg Oral Daily   multivitamin with minerals  1 tablet Oral Daily   pantoprazole  40 mg Oral Daily   polyethylene glycol-electrolytes  4,000 mL Oral Once   traZODone  100 mg Oral QHS   Infusions:   heparin 1,100 Units/hr (01/22/23 1034)   PRN: acetaminophen **OR** acetaminophen, albuterol, cyclobenzaprine, HYDROcodone-acetaminophen,  ondansetron **OR** ondansetron (ZOFRAN) IV  Assessment: Lucas Bowman, is a 72 YO M, presenting with an upper GI bleed with a PMH of a mechanical AVR, mitral valve repair / annuloplasty in 2001, A.Fib (on warfarin), and HTN. Patient's last warfarin dose was on 7/22 and anticoagulation therapy has been held until patient's INR < 2. Pts current INR is 1.6 (7/27). Pts hgb and plts are stable and improved since admission with no additional S/sx of bleeding.   Heparin level returned subtherapeutic at 0.24, confirmed no issues with infusion with the nurse. Will increase heparin and ensure infusion is set to stop at midnight.   Goal of Therapy:  Heparin level 0.3-0.7 units/ml Monitor platelets by anticoagulation protocol: Yes   Plan:  Increase heparin infusion to 1250 units/hr Will not order another level as infusion to be turned off at midnight Monitor CBC and s/sx of bleeding    Lucas Bowman, PharmD PGY1 Pharmacy Resident 01/22/2023 5:56 PM

## 2023-01-22 NOTE — Progress Notes (Signed)
Progress Note  Patient Name: Lucas Bowman Date of Encounter: 01/22/2023  [facility] Cardiologist: None   Subjective   Admitted with GI bleed.  Off Coumadin.  INR 1.6 today.  Plan for endoscopy tomorrow.  On IV heparin due to mechanical aortic valve and atrial fibrillation.  Inpatient Medications    Scheduled Meds:  allopurinol  300 mg Oral Daily   carvedilol  6.25 mg Oral BID WC   DULoxetine  30 mg Oral Daily   loratadine  10 mg Oral Daily   multivitamin with minerals  1 tablet Oral Daily   pantoprazole  40 mg Oral Daily   polyethylene glycol-electrolytes  4,000 mL Oral Once   traZODone  100 mg Oral QHS   Continuous Infusions:  heparin 1,100 Units/hr (01/22/23 1034)   PRN Meds: acetaminophen **OR** acetaminophen, albuterol, cyclobenzaprine, HYDROcodone-acetaminophen, ondansetron **OR** ondansetron (ZOFRAN) IV   Vital Signs    Vitals:   01/21/23 2326 01/21/23 2340 01/22/23 0333 01/22/23 0915  BP: 91/63 110/79 121/86   Pulse: 72 76 96   Resp: 20 17 18    Temp: 98.5 F (36.9 C)  98.4 F (36.9 C) (!) 97.5 F (36.4 C)  TempSrc: Oral  Oral Oral  SpO2: 94% 96% 97%   Weight:      Height:        Intake/Output Summary (Last 24 hours) at 01/22/2023 1152 Last data filed at 01/21/2023 2100 Gross per 24 hour  Intake 240 ml  Output --  Net 240 ml      01/19/2023    6:27 PM 01/18/2023    6:33 PM 03/08/2019    2:38 PM  Last 3 Weights  Weight (lbs) 166 lb 14.2 oz 170 lb 172 lb 9.6 oz  Weight (kg) 75.7 kg 77.111 kg 78.291 kg      Telemetry    Atrial fibrillation-personally reviewed  ECG    No new EKG to review - Personally Reviewed  Physical Exam  GEN: Well nourished, well developed, in no acute distress  HEENT: normal  Neck: no JVD, carotid bruits, or masses Cardiac: RRR; crisp valve sounds Respiratory:  clear to auscultation bilaterally, normal work of breathing GI: soft, nontender, nondistended, + BS MS: no deformity or atrophy  Skin: warm and  dry Neuro:  Strength and sensation are intact Psych: euthymic mood, full affect    High Sensitivity Troponin:  No results for input(s): "TROPONINIHS" in the last 720 hours.    Chemistry Recent Labs  Lab 01/18/23 1845 01/18/23 1851 01/19/23 0730 01/20/23 0621 01/21/23 0538 01/22/23 0549  NA 134*   < > 138 139 139 137  K 4.0   < > 3.7 3.8 3.7 3.5  CL 101   < > 99 105 100 103  CO2 26  --  25 26 28 24   GLUCOSE 108*   < > 108* 97 100* 95  BUN 29*   < > 23 16 10 10   CREATININE 1.11   < > 1.09 0.93 0.85 0.75  CALCIUM 8.4*  --  8.7* 8.5* 9.1 8.6*  PROT 5.9*  --  5.6*  --   --   --   ALBUMIN 3.4*  --  3.3*  --   --   --   AST 22  --  22  --   --   --   ALT 13  --  14  --   --   --   ALKPHOS 50  --  46  --   --   --  BILITOT 0.2*  --  0.9  --   --   --   GFRNONAA >60  --  >60 >60 >60 >60  ANIONGAP 7  --  14 8 11 10    < > = values in this interval not displayed.     Hematology Recent Labs  Lab 01/21/23 0538 01/21/23 1604 01/22/23 0549  WBC 5.2 5.5 5.2  RBC 3.20* 3.00* 2.81*  HGB 8.7* 8.4* 8.0*  HCT 28.0* 26.6* 24.7*  MCV 87.5 88.7 87.9  MCH 27.2 28.0 28.5  MCHC 31.1 31.6 32.4  RDW 15.7* 15.8* 15.7*  PLT 183 182 190    BNPNo results for input(s): "BNP", "PROBNP" in the last 168 hours.   DDimer No results for input(s): "DDIMER" in the last 168 hours.   Radiology    No results found.  Patient Profile     72 y.o. male  with permanent atrial fibrillation, history of mechanical aortic valve replacement (on chronic warfarin), history of ascending aortic aneurysm s/p mesh repair, mitral valve repair, HTN, HLD, diverticulosis, and GERD/gastritis who is being seen 01/19/2023 for the evaluation of anticoagulation management in the setting of acute GIB at the request of internal medicine.   Assessment & Plan    1.  Acute GI bleed: GI following.  Plan for endoscopy tomorrow.  2.  Chronic atrial fibrillation: Currently on warfarin.  Heart rates have been better controlled.   Warfarin on hold and now on heparin.  Dejean Tribby stop heparin tonight in anticipation of endoscopy.  3.  Mechanical aortic valve: Currently on heparin due to GI bleed.  No further bleeding thus far.  Would stop heparin as above for endoscopy tomorrow.  Jadwiga Faidley need to restart heparin and bridged to therapeutic Coumadin post endoscopy.     For questions or updates, please contact [facility] Please consult [email or web address] for contact info under        Signed, Daniella Dewberry Jorja Loa, MD  01/22/2023, 11:52 AM

## 2023-01-22 NOTE — Progress Notes (Signed)
PROGRESS NOTE    BARTT HA  WUJ:[id number] DOB: September 05, 1950 DOA: 01/18/2023 PCP: Kathlee Nations, MD   Chief Complaint  Patient presents with   Emesis   Diarrhea    Brief Narrative:    Lucas Bowman is a 72 y.o. male with medical history significant of Mechanical AVR, mitral valve repair / annuloplasty in 2001, A.Fib, on chronic coumadin for mechanical valve, HTN.  - Pt in to ED with onset of GIB with hematemesis ,  Maroon stools , Has had presyncope with this and fatigue.with h/o chronic afib and mechanical AVR on chronic warfarin , seen by GI,GI recommended holding coumadin and let INR drift down to plan EGD/Colon once INR<1.5.  [name] remains 1.9 today, received total 4 units PRBC during hospital stay.    Assessment & Plan:   Principal Problem:   UGIB (upper gastrointestinal bleed) Active Problems:   ABLA (acute blood loss anemia)   HEART VALVE REPLACEMENT, HX OF   Acute pancreatitis   Chronic a-fib (HCC)   Essential hypertension   Cirrhosis (HCC)   MCI (mild cognitive impairment)   Current use of long term anticoagulation   H/O mechanical aortic valve replacement   Symptomatic anemia   Family history of colon cancer    UGIB (upper gastrointestinal bleed) Acute blood loss anemia - new diagnosis of cirrhosis, GI input greatly appreciated, plan for endoscopy once INR less than 1.5. -presents with hemoglobin 4.9 on admission, received 4 units, stable this morning at 8 -GI input greatly appreciated, plan for endoscopy/colonoscopy tomorrow -Continue to hold warfarin. -Continue to monitor CBC closely and transfuse as needed -Evidence of cirrhosis on imaging, per GI, hepatitis panel is negative -Globin this morning is 8, continue to monitor, transfuse if continues to trend down   Chronic a-fib (HCC) -Warfarin on hold, INR is 1.6, started on heparin gtt. - Back on coreg as BP improved .  Cirrhosis on CTA Normal LFTs No previous abdominal imaging. Denies alcohol  use.  Negative hepatitis panel   S/p mechanical AVR and mitral valve repair on Coumadin PT 28.6/INR 2.7 on admission.  Down to 1.9 today, given his aortic valve, will hold on heparin gtt. over the next 24 hours, can start without bolus if hemoglobin remained stable.   Patient with finding of abdominal pancreatitis on imaging - Question of acute pancreatitis. - Being called on imaging study; however, Lipase normal, only having minimal epigastric discomfort. - No EtOH intake - H/o cholecystectomy in past  History of mechanical heart valve replacement - [employer] Jude mechanical AVR. 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 -Morning within normal limits    Essential hypertension Blood pressure initially soft, now improved, back on Coreg, continue to hold Lotensin    DVT prophylaxis: INR is 2  Code Status: Full Family Communication: None at bedside Disposition:   Status is: Inpatient  Consultants:  Cardiology GI   Subjective:  No significant events overnight, he denies any complaints today  Objective: Vitals:   01/21/23 2340 01/22/23 0333 01/22/23 0915 01/22/23 1200  BP: 110/79 121/86  123/63  Pulse: 76 96    Resp: 17 18    Temp:  98.4 F (36.9 C) (!) 97.5 F (36.4 C)   TempSrc:  Oral Oral Oral  SpO2: 96% 97%  100%  Weight:  Height:        Intake/Output Summary (Last 24 hours) at 01/22/2023 1440 Last data filed at 01/22/2023 0930 Gross per 24 hour  Intake 600 ml  Output --  Net 600 ml   Filed Weights   01/18/23 1833 01/19/23 1827  Weight: 77.1 kg 75.7 kg    Examination:  Awake Alert, Oriented X 3, No new F.N deficits, Normal affect Symmetrical Chest wall movement, Good air movement bilaterally, CTAB Regular, mechanical click +ve  B.Sounds, Abd Soft, No tenderness, No rebound - guarding or rigidity. No Cyanosis, Clubbing or edema, No new Rash or bruise      Data Reviewed: I have personally reviewed following labs and imaging studies  CBC: Recent Labs  Lab 01/18/23 1845 01/18/23 1851 01/20/23 1426 01/20/23 2009 01/21/23 0538 01/21/23 1604 01/22/23 0549  WBC 7.2  --   --   --  5.2 5.5 5.2  NEUTROABS 4.7  --   --   --   --   --   --   HGB 4.9*   < > 8.7* 7.8* 8.7* 8.4* 8.0*  HCT 15.5*   < > 28.0* 24.7* 28.0* 26.6* 24.7*  MCV 86.6  --   --   --  87.5 88.7 87.9  PLT 198  --   --   --  183 182 190   < > = values in this interval not displayed.    Basic Metabolic Panel: Recent Labs  Lab 01/18/23 1845 01/18/23 1851 01/19/23 0730 01/20/23 0621 01/21/23 0538 01/22/23 0549  NA 134* 136 138 139 139 137  K 4.0 4.1 3.7 3.8 3.7 3.5  CL 101 101 99 105 100 103  CO2 26  --  25 26 28 24   GLUCOSE 108* 104* 108* 97 100* 95  BUN 29* 30* 23 16 10 10   CREATININE 1.11 1.20 1.09 0.93 0.85 0.75  CALCIUM 8.4*  --  8.7* 8.5* 9.1 8.6*    GFR: Estimated Creatinine Clearance: 79.6 mL/min (by C-G formula based on SCr of 0.75 mg/dL).  Liver Function Tests: Recent Labs  Lab 01/18/23 1845 01/19/23 0730  AST 22 22  ALT 13 14  ALKPHOS 50 46  BILITOT 0.2* 0.9  PROT 5.9* 5.6*  ALBUMIN 3.4* 3.3*    CBG: No results for input(s): "GLUCAP" in the last 168 hours.   No results found for this or any previous visit (from the past 240 hour(s)).       Radiology Studies: No results found.      Scheduled Meds:  allopurinol  300 mg Oral Daily   carvedilol  6.25 mg Oral BID WC   DULoxetine  30 mg Oral Daily   loratadine  10 mg Oral Daily   multivitamin with minerals  1 tablet Oral Daily   pantoprazole  40 mg Oral Daily   polyethylene glycol-electrolytes  4,000 mL Oral Once   traZODone  100 mg Oral QHS   Continuous Infusions:  heparin 1,100 Units/hr (01/22/23 1034)     LOS: 3 days     Lucas Bienenstock,  MD [facility]   To contact the attending provider between 7A-7P or the covering provider during after hours 7P-7A, please log into the web site [email or web address] and access using universal [place] password for that web site. If you do not have the password, please call the hospital operator.  01/22/2023, 2:40 PM

## 2023-01-23 ENCOUNTER — Encounter (HOSPITAL_COMMUNITY)
Admission: EM | Disposition: A | Discharge status: Home Health | Payer: Self-pay | Source: Home / Self Care | Attending: Internal Medicine

## 2023-01-23 ENCOUNTER — Inpatient Hospital Stay (HOSPITAL_COMMUNITY): Payer: Medicare Other | Admitting: Certified Registered Nurse Anesthetist

## 2023-01-23 ENCOUNTER — Encounter (HOSPITAL_COMMUNITY): Payer: Self-pay | Admitting: Internal Medicine

## 2023-01-23 DIAGNOSIS — D123 Benign neoplasm of transverse colon: Secondary | ICD-10-CM

## 2023-01-23 DIAGNOSIS — K317 Polyp of stomach and duodenum: Secondary | ICD-10-CM | POA: Diagnosis not present

## 2023-01-23 DIAGNOSIS — I482 Chronic atrial fibrillation, unspecified: Secondary | ICD-10-CM | POA: Diagnosis not present

## 2023-01-23 DIAGNOSIS — I10 Essential (primary) hypertension: Secondary | ICD-10-CM | POA: Diagnosis not present

## 2023-01-23 DIAGNOSIS — D509 Iron deficiency anemia, unspecified: Secondary | ICD-10-CM | POA: Diagnosis not present

## 2023-01-23 DIAGNOSIS — D62 Acute posthemorrhagic anemia: Secondary | ICD-10-CM | POA: Diagnosis not present

## 2023-01-23 DIAGNOSIS — K7460 Unspecified cirrhosis of liver: Secondary | ICD-10-CM | POA: Diagnosis not present

## 2023-01-23 DIAGNOSIS — K922 Gastrointestinal hemorrhage, unspecified: Secondary | ICD-10-CM | POA: Diagnosis not present

## 2023-01-23 HISTORY — PX: COLONOSCOPY WITH PROPOFOL: SHX5780

## 2023-01-23 HISTORY — PX: ESOPHAGOGASTRODUODENOSCOPY (EGD) WITH PROPOFOL: SHX5813

## 2023-01-23 HISTORY — PX: POLYPECTOMY: SHX5525

## 2023-01-23 LAB — BASIC METABOLIC PANEL WITH GFR
Anion gap: 14 (ref 5–15)
BUN: 6 mg/dL — ABNORMAL LOW (ref 8–23)
CO2: 22 mmol/L (ref 22–32)
Calcium: 9.2 mg/dL (ref 8.9–10.3)
Chloride: 101 mmol/L (ref 98–111)
Creatinine, Ser: 0.76 mg/dL (ref 0.61–1.24)
GFR, Estimated: >60 mL/min (ref >60)
Glucose, Bld: 101 mg/dL — ABNORMAL HIGH (ref 70–99)
Potassium: 3.8 mmol/L (ref 3.5–5.1)
Sodium: 137 mmol/L (ref 135–145)

## 2023-01-23 LAB — PROTIME-INR
INR: 1.3 — ABNORMAL HIGH (ref 0.8–1.2)
Prothrombin Time: 16.7 s — ABNORMAL HIGH (ref 11.4–15.2)

## 2023-01-23 LAB — CBC
HCT: 27.8 % — ABNORMAL LOW (ref 39.0–52.0)
Hemoglobin: 8.9 g/dL — ABNORMAL LOW (ref 13.0–17.0)
MCH: 27.4 pg (ref 26.0–34.0)
MCHC: 32 g/dL (ref 30.0–36.0)
MCV: 85.5 fL (ref 80.0–100.0)
Platelets: 232 10*3/uL (ref 150–400)
RBC: 3.25 MIL/uL — ABNORMAL LOW (ref 4.22–5.81)
RDW: 15.8 % — ABNORMAL HIGH (ref 11.5–15.5)
WBC: 4.9 10*3/uL (ref 4.0–10.5)
nRBC: 0 % (ref 0.0–0.2)

## 2023-01-23 SURGERY — COLONOSCOPY WITH PROPOFOL
Anesthesia: Monitor Anesthesia Care

## 2023-01-23 MED ORDER — SODIUM CHLORIDE 0.9 % IV SOLN: INTRAVENOUS | Status: DC

## 2023-01-23 MED ORDER — HEPARIN (PORCINE) 25000 UT/250ML-% IV SOLN: 1250.0000 [IU]/h | INTRAVENOUS | Status: DC

## 2023-01-23 MED ORDER — WARFARIN - PHARMACIST DOSING INPATIENT: Freq: Every day | Status: DC

## 2023-01-23 MED ORDER — LACTATED RINGERS IV SOLN: INTRAVENOUS | Status: DC

## 2023-01-23 MED ORDER — PROPOFOL 10 MG/ML IV BOLUS
INTRAVENOUS | Status: DC | PRN
  Administered 2023-01-23 (×2): 20 mg via INTRAVENOUS

## 2023-01-23 MED ORDER — LIDOCAINE 2% (20 MG/ML) 5 ML SYRINGE
INTRAMUSCULAR | Status: DC | PRN
  Administered 2023-01-23: 40 mg via INTRAVENOUS

## 2023-01-23 MED ORDER — PROPOFOL 500 MG/50ML IV EMUL
INTRAVENOUS | Status: DC | PRN
  Administered 2023-01-23: 180 ug/kg/min via INTRAVENOUS

## 2023-01-23 MED ORDER — WARFARIN SODIUM 5 MG PO TABS
5.0000 mg | ORAL_TABLET | Freq: Once | ORAL | Status: AC
  Administered 2023-01-23: 5 mg via ORAL
  Filled 2023-01-23: qty 1

## 2023-01-23 MED ORDER — HEPARIN (PORCINE) 25000 UT/250ML-% IV SOLN
1300.0000 [IU]/h | INTRAVENOUS | Status: DC
  Administered 2023-01-23: 1250 [IU]/h via INTRAVENOUS
  Administered 2023-01-24: 1300 [IU]/h via INTRAVENOUS
  Filled 2023-01-23 (×2): qty 250

## 2023-01-23 SURGICAL SUPPLY — 25 items

## 2023-01-23 NOTE — Anesthesia Postprocedure Evaluation (Signed)
Anesthesia Post Note  Patient: Lucas Bowman  Procedure(s) Performed: COLONOSCOPY WITH PROPOFOL ESOPHAGOGASTRODUODENOSCOPY (EGD) WITH PROPOFOL POLYPECTOMY     Patient location during evaluation: Endoscopy Anesthesia Type: MAC Level of consciousness: awake Pain management: pain level controlled Vital Signs Assessment: post-procedure vital signs reviewed and stable Respiratory status: spontaneous breathing, nonlabored ventilation and respiratory function stable Cardiovascular status: stable and blood pressure returned to baseline Postop Assessment: no apparent nausea or vomiting Anesthetic complications: no   No notable events documented.  Last Vitals:  Vitals:   01/23/23 1210 01/23/23 1220  BP: 117/74 138/65  Pulse: 85 87  Resp: 17 18  Temp:    SpO2: 100% 100%    Last Pain:  Vitals:   01/23/23 1220  TempSrc:   PainSc: Asleep                 Anhelica Fowers P Rodneisha Bonnet

## 2023-01-23 NOTE — Progress Notes (Signed)
PROGRESS NOTE    Lucas Bowman  ZOX:[id number] DOB: 04-25-1951 DOA: 01/18/2023 PCP: Kathlee Nations, MD   Chief Complaint  Patient presents with   Emesis   Diarrhea    Brief Narrative:    Lucas Bowman is a 72 y.o. male with medical history significant of Mechanical AVR, mitral valve repair / annuloplasty in 2001, A.Fib, on chronic coumadin for mechanical valve, HTN.  - Pt in to ED with onset of GIB with hematemesis ,  Maroon stools , Has had presyncope with this and fatigue.with h/o chronic afib and mechanical AVR on chronic warfarin , seen by GI,GI recommended holding coumadin and let INR drift down to plan EGD/Colon once INR<1.5.  [name] remains 1.9 today, received total 4 units PRBC during hospital stay.    Assessment & Plan:   Principal Problem:   UGIB (upper gastrointestinal bleed) Active Problems:   ABLA (acute blood loss anemia)   HEART VALVE REPLACEMENT, HX OF   Acute pancreatitis   Chronic a-fib (HCC)   Essential hypertension   Cirrhosis (HCC)   MCI (mild cognitive impairment)   Current use of long term anticoagulation   H/O mechanical aortic valve replacement   Symptomatic anemia   Family history of colon cancer    GIB ( gastrointestinal bleed) Acute blood loss anemia - new diagnosis of cirrhosis, GI input greatly appreciated, plan for endoscopy once INR less than 1.5. -presents with hemoglobin 4.9 on admission, received 4 units, 4 hemoglobin remained stable -GI input greatly appreciated, went for endoscopy/colonoscopy 7/28, was significant for gastric polyp status postresection, and 6 polyps in colon s/p resection as well. -Evidence of cirrhosis on imaging, per GI, hepatitis panel is negative   Chronic a-fib (HCC) -Warfarin on hold, be resumed today, meanwhile he will bridge with heparin GTT - Back on coreg as BP improved .  Cirrhosis on CTA Normal LFTs No previous abdominal imaging. Denies alcohol use.  Negative hepatitis panel   S/p mechanical AVR  and mitral valve repair on Coumadin PT 28.6/INR 2.7 on admission.   -This post procedure, can resume warfarin, meanwhile well bridge with  heparin gtt.   Patient with finding of abdominal pancreatitis on imaging - Question of acute pancreatitis. - Being called on imaging study; however, Lipase normal, only having minimal epigastric discomfort. - No EtOH intake - H/o cholecystectomy in past  History of mechanical heart valve replacement - [employer]. Jude mechanical AVR. 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 -Morning within normal limits    Essential hypertension Blood pressure initially soft, now improved, back on Coreg, continue to hold Lotensin    DVT prophylaxis: INR is 2  Code Status: Full Family Communication: None at bedside Disposition:   Status is: Inpatient  Consultants:  Cardiology GI   Subjective:  Patient reportedly had a rough night given he kept going back and forth to the bathroom secondary to bowel prep  Objective: Vitals:   01/23/23 1159 01/23/23 1200 01/23/23 1210 01/23/23 1220  BP: 101/68 122/71 117/74 138/65  Pulse: 81 74 85 87  Resp: 18 19 17 18   Temp:      TempSrc:      SpO2: 100% 100% 100% 100%  Weight:      Height:        Intake/Output Summary (Last 24  hours) at 01/23/2023 1428 Last data filed at 01/23/2023 1141 Gross per 24 hour  Intake 490 ml  Output --  Net 490 ml   Filed Weights   01/18/23 1833 01/19/23 1827  Weight: 77.1 kg 75.7 kg    Examination:  Awake Alert, Oriented X 3, No new F.N deficits, Normal affect Symmetrical Chest wall movement, Good air movement bilaterally, CTAB Irregular,  mechanical click present +ve B.Sounds, Abd Soft, No tenderness, No rebound - guarding or rigidity. No Cyanosis, Clubbing  or edema, No new Rash or bruise       Data Reviewed: I have personally reviewed following labs and imaging studies  CBC: Recent Labs  Lab 01/18/23 1845 01/18/23 1851 01/21/23 0538 01/21/23 1604 01/22/23 0549 01/22/23 1722 01/23/23 0456  WBC 7.2  --  5.2 5.5 5.2 4.9 4.9  NEUTROABS 4.7  --   --   --   --   --   --   HGB 4.9*   < > 8.7* 8.4* 8.0* 8.3* 8.9*  HCT 15.5*   < > 28.0* 26.6* 24.7* 26.2* 27.8*  MCV 86.6  --  87.5 88.7 87.9 86.2 85.5  PLT 198  --  183 182 190 198 232   < > = values in this interval not displayed.    Basic Metabolic Panel: Recent Labs  Lab 01/19/23 0730 01/20/23 0621 01/21/23 0538 01/22/23 0549 01/23/23 0456  NA 138 139 139 137 137  K 3.7 3.8 3.7 3.5 3.8  CL 99 105 100 103 101  CO2 25 26 28 24 22   GLUCOSE 108* 97 100* 95 101*  BUN 23 16 10 10  6*  CREATININE 1.09 0.93 0.85 0.75 0.76  CALCIUM 8.7* 8.5* 9.1 8.6* 9.2    GFR: Estimated Creatinine Clearance: 79.6 mL/min (by C-G formula based on SCr of 0.76 mg/dL).  Liver Function Tests: Recent Labs  Lab 01/18/23 1845 01/19/23 0730  AST 22 22  ALT 13 14  ALKPHOS 50 46  BILITOT 0.2* 0.9  PROT 5.9* 5.6*  ALBUMIN 3.4* 3.3*    CBG: No results for input(s): "GLUCAP" in the last 168 hours.   No results found for this or any previous visit (from the past 240 hour(s)).       Radiology Studies: No results found.      Scheduled Meds:  allopurinol  300 mg Oral Daily   carvedilol  6.25 mg Oral BID WC   DULoxetine  30 mg Oral Daily   loratadine  10 mg Oral Daily   multivitamin with minerals  1 tablet Oral Daily   pantoprazole  40 mg Oral Daily   traZODone  100 mg Oral QHS   Continuous Infusions:  heparin     lactated ringers 20 mL/hr at 01/23/23 1027     LOS: 4 days     Huey Bienenstock, MD [facility]   To contact the attending provider between 7A-7P or the covering provider during after hours 7P-7A, please log into the web site [email or web address] and access using  universal [place] password for that web site. If you do not have the password, please call the hospital operator.  01/23/2023, 2:28 PM

## 2023-01-23 NOTE — Interval H&P Note (Signed)
History and Physical Interval Note:  01/23/2023 10:31 AM  Lucas Bowman  has presented today for surgery, with the diagnosis of IDA.  The various methods of treatment have been discussed with the patient and family. After consideration of risks, benefits and other options for treatment, the patient has consented to  Procedure(s): COLONOSCOPY WITH PROPOFOL (N/A) ESOPHAGOGASTRODUODENOSCOPY (EGD) WITH PROPOFOL (N/A) as a surgical intervention.  The patient's history has been reviewed, patient examined, no change in status, stable for surgery.  I have reviewed the patient's chart and labs.  Questions were answered to the patient's satisfaction.     Leyda Vanderwerf D

## 2023-01-23 NOTE — H&P (View-Only) (Signed)
Subjective: Difficult time with drinking the prep.  Objective: Vital signs in last 24 hours: Temp:  [97.6 F (36.4 C)-98.6 F (37 C)] 97.6 F (36.4 C) (07/28 0800) Pulse Rate:  [80-138] 95 (07/28 1022) Resp:  [20-22] 22 (07/28 1022) BP: (123-147)/(63-107) 147/87 (07/28 1022) SpO2:  [93 %-100 %] 96 % (07/28 1022) Last BM Date : 01/22/23  Intake/Output from previous day: 07/27 0701 - 07/28 0700 In: 840 [P.O.:840] Out: -  Intake/Output this shift: No intake/output data recorded.  General appearance: uncomfortable drinking prep GI: some minor abdominal distension  Lab Results: Recent Labs    01/22/23 0549 01/22/23 1722 01/23/23 0456  WBC 5.2 4.9 4.9  HGB 8.0* 8.3* 8.9*  HCT 24.7* 26.2* 27.8*  PLT 190 198 232   BMET Recent Labs    01/21/23 0538 01/22/23 0549 01/23/23 0456  NA 139 137 137  K 3.7 3.5 3.8  CL 100 103 101  CO2 28 24 22   GLUCOSE 100* 95 101*  BUN 10 10 6*  CREATININE 0.85 0.75 0.76  CALCIUM 9.1 8.6* 9.2   LFT No results for input(s): "PROT", "ALBUMIN", "AST", "ALT", "ALKPHOS", "BILITOT", "BILIDIR", "IBILI" in the last 72 hours. PT/INR Recent Labs    01/22/23 0549 01/23/23 0456  LABPROT 18.9* 16.7*  INR 1.6* 1.3*   Hepatitis Panel Recent Labs    01/21/23 2047  HEPBSAG NON REACTIVE  HCVAB NON REACTIVE  HEPAIGM NON REACTIVE  HEPBIGM NON REACTIVE   C-Diff No results for input(s): "CDIFFTOX" in the last 72 hours. Fecal Lactopherrin No results for input(s): "FECLLACTOFRN" in the last 72 hours.  Studies/Results: No results found.  Medications: Scheduled:  [MAR Hold] allopurinol  300 mg Oral Daily   [MAR Hold] carvedilol  6.25 mg Oral BID WC   [MAR Hold] DULoxetine  30 mg Oral Daily   [MAR Hold] loratadine  10 mg Oral Daily   [MAR Hold] multivitamin with minerals  1 tablet Oral Daily   [MAR Hold] pantoprazole  40 mg Oral Daily   [MAR Hold] traZODone  100 mg Oral QHS   Continuous:  sodium chloride 20 mL/hr at 01/23/23 0409    lactated ringers 20 mL/hr at 01/23/23 1027    Assessment/Plan: 1) IDA. 2) Probable duodenal ulcer.  Plan: 1) EGD/colonoscopy tomorrow.  LOS: 4 days   Tiauna Whisnant D 01/23/2023, 10:30 AM

## 2023-01-23 NOTE — Transfer of Care (Signed)
Immediate Anesthesia Transfer of Care Note  Patient: Lucas Bowman  Procedure(s) Performed: COLONOSCOPY WITH PROPOFOL ESOPHAGOGASTRODUODENOSCOPY (EGD) WITH PROPOFOL POLYPECTOMY  Patient Location: Endoscopy Unit  Anesthesia Type:MAC  Level of Consciousness: sedated  Airway & Oxygen Therapy: Patient Spontanous Breathing and Patient connected to nasal cannula oxygen  Post-op Assessment: Report given to RN and Post -op Vital signs reviewed and stable  Post vital signs: Reviewed and stable  Last Vitals:  Vitals Value Taken Time  BP    Temp    Pulse    Resp    SpO2      Last Pain:  Vitals:   01/23/23 1022  TempSrc:   PainSc: 8          Complications: No notable events documented.

## 2023-01-23 NOTE — Progress Notes (Signed)
ANTICOAGULATION CONSULT NOTE   Pharmacy Consult for warfarin  Indication: atrial fibrillation/mechanical valve  Allergies  Allergen Reactions   Other     Pneumonia shot-redness, swelling   Sulfonamide Derivatives Other (See Comments)    Patient Measurements: Height: 5\' 8"  (172.7 cm) Weight: 75.7 kg (166 lb 14.2 oz) IBW/kg (Calculated) : 68.4  Vital Signs: Temp: 97.6 F (36.4 C) (07/28 0800) Temp Source: Oral (07/28 0800) BP: 138/65 (07/28 1220) Pulse Rate: 87 (07/28 1220)  Labs: Recent Labs    01/21/23 0538 01/21/23 1604 01/22/23 0549 01/22/23 1722 01/23/23 0456  HGB 8.7*   < > 8.0* 8.3* 8.9*  HCT 28.0*   < > 24.7* 26.2* 27.8*  PLT 183   < > 190 198 232  LABPROT 21.7*  --  18.9*  --  16.7*  INR 1.9*  --  1.6*  --  1.3*  HEPARINUNFRC  --   --   --  0.24*  --   CREATININE 0.85  --  0.75  --  0.76   < > = values in this interval not displayed.    Estimated Creatinine Clearance: 79.6 mL/min (by C-G formula based on SCr of 0.76 mg/dL).   Medical History: Past Medical History:  Diagnosis Date   Anemia    Depression    Gastritis    GERD (gastroesophageal reflux disease)    Gout    H/O aortic valve replacement    Hemorrhoids    Hiatal hernia    Internal hemorrhoids    Irritable bowel syndrome    Mitral insufficiency    Neck pain    Septic shock (HCC) 03/06/2009   Sleep apnea    Tubular adenoma     Medications:  Scheduled:   allopurinol  300 mg Oral Daily   carvedilol  6.25 mg Oral BID WC   DULoxetine  30 mg Oral Daily   loratadine  10 mg Oral Daily   multivitamin with minerals  1 tablet Oral Daily   pantoprazole  40 mg Oral Daily   traZODone  100 mg Oral QHS   warfarin  5 mg Oral Once   Warfarin - Pharmacist Dosing Inpatient   Does not apply q1600   Infusions:   heparin     lactated ringers 20 mL/hr at 01/23/23 1027   PRN: acetaminophen **OR** acetaminophen, albuterol, cyclobenzaprine, HYDROcodone-acetaminophen, HYDROmorphone (DILAUDID)  injection, ondansetron **OR** ondansetron (ZOFRAN) IV  Assessment: Lucas Bowman, is a 72 YO M, presenting with an upper GI bleed with a PMH of a mechanical AVR, mitral valve repair / annuloplasty in 2001, A.Fib (on warfarin), and HTN. Patient's last warfarin dose was on 7/22 and anticoagulation therapy has been held until patient's INR < 2. Pts current INR is 1.6 (7/27). Pts hgb and plts are stable and improved since admission with no additional S/sx of bleeding. Heparin was started on 7/27 and held for colonoscopy and endoscopy procedure on 01/23/23. Heparin and warfarin are to be resumed at 1800 tonight (7/28).   Goal of Therapy:  INR 2.5-3.5  - Pt unable to confirm goal - will attempt to follow-up with outpatient provider Monitor platelets by anticoagulation protocol: Yes   Plan:  Warfarin 5 mg x 1 dose Check INR daily  Continue to monitor H&H and platelets Monitor s/sx of bleeding   Roslyn Smiling, PharmD PGY1 Pharmacy Resident 01/23/2023 2:59 PM

## 2023-01-23 NOTE — Op Note (Addendum)
[facility] Patient Name: Lucas Bowman Procedure Date : 01/23/2023 MRN: [id number] Attending MD: Jeani Hawking , MD, [id number] Date of Birth: Sep 10, 1950 CSN: [id number] Age: 72 Admit Type: Inpatient Procedure:                Colonoscopy Indications:              Iron deficiency anemia Providers:                Jeani Hawking, MD, Rozetta Nunnery, Technician,                            Fransisca Connors Referring MD:              Medicines:                Propofol per Anesthesia Complications:            No immediate complications. Estimated Blood Loss:     Estimated blood loss: none. Procedure:                Pre-Anesthesia Assessment:                           - Prior to the procedure, a History and Physical                            was performed, and patient medications and                            allergies were reviewed. The patient's tolerance of                            previous anesthesia was also reviewed. The risks                            and benefits of the procedure and the sedation                            options and risks were discussed with the patient.                            All questions were answered, and informed consent                            was obtained. Prior Anticoagulants: The patient has                            taken Coumadin (warfarin), last dose was 5 days                            prior to procedure. ASA Grade Assessment: III - A                            patient with severe systemic disease. After  reviewing the risks and benefits, the patient was                            deemed in satisfactory condition to undergo the                            procedure.                           - Sedation was administered by an anesthesia                            professional. Deep sedation was attained.                           After obtaining informed consent, the colonoscope                             was passed under direct vision. Throughout the                            procedure, the patient's blood pressure, pulse, and                            oxygen saturations were monitored continuously. The                            PCF-HQ190L ([id number] Olympus colonoscope was                            introduced through the anus and advanced to the the                            cecum, identified by appendiceal orifice and                            ileocecal valve. The colonoscopy was performed                            without difficulty. The patient tolerated the                            procedure well. The quality of the bowel                            preparation was evaluated using the BBPS [facility]                            Bowel Preparation Scale) with scores of: Right                            Colon = 2 (minor amount of residual staining, small  fragments of stool and/or opaque liquid, but mucosa                            seen well), Transverse Colon = 3 (entire mucosa                            seen well with no residual staining, small                            fragments of stool or opaque liquid) and Left Colon                            = 3 (entire mucosa seen well with no residual                            staining, small fragments of stool or opaque                            liquid). The total BBPS score equals 8. The quality                            of the bowel preparation was good. The ileocecal                            valve, appendiceal orifice, and rectum were                            photographed. Scope In: 11:17:51 AM Scope Out: 11:38:52 AM Scope Withdrawal Time: 0 hours 14 minutes 41 seconds  Total Procedure Duration: 0 hours 21 minutes 1 second  Findings:      Six sessile and semi-pedunculated polyps were found in the transverse       colon and ascending colon. The polyps were 2 to 4 mm in size. These       polyps  were removed with a cold snare. Resection and retrieval were       complete.      A few small-mouthed diverticula were found in the sigmoid colon. Impression:               - Six 2 to 4 mm polyps in the transverse colon and                            in the ascending colon, removed with a cold snare.                            Resected and retrieved.                           - Diverticulosis in the sigmoid colon. Recommendation:           - Return patient to hospital ward for ongoing care.                           - Resume regular diet.                           -  Continue present medications.                           - Await pathology results.                           - Repeat colonoscopy in 3 years for surveillance,                            if clinically appropriate.                           - Restart coumadin today. Procedure Code(s):        --- Professional ---                           [phone], Colonoscopy, flexible; with removal of                            tumor(s), polyp(s), or other lesion(s) by snare                            technique Diagnosis Code(s):        --- Professional ---                           D12.3, Benign neoplasm of transverse colon (hepatic                            flexure or splenic flexure)                           D12.2, Benign neoplasm of ascending colon                           D50.9, Iron deficiency anemia, unspecified                           K57.30, Diverticulosis of large intestine without                            perforation or abscess without bleeding CPT copyright 2022 American Medical Association. All rights reserved. The codes documented in this report are preliminary and upon coder review may  be revised to meet current compliance requirements. Jeani Hawking, MD Jeani Hawking, MD 01/23/2023 11:50:25 AM This report has been signed electronically. Number of Addenda: 0

## 2023-01-23 NOTE — Progress Notes (Signed)
Subjective: Difficult time with drinking the prep.  Objective: Vital signs in last 24 hours: Temp:  [97.6 F (36.4 C)-98.6 F (37 C)] 97.6 F (36.4 C) (07/28 0800) Pulse Rate:  [80-138] 95 (07/28 1022) Resp:  [20-22] 22 (07/28 1022) BP: (123-147)/(63-107) 147/87 (07/28 1022) SpO2:  [93 %-100 %] 96 % (07/28 1022) Last BM Date : 01/22/23  Intake/Output from previous day: 07/27 0701 - 07/28 0700 In: 840 [P.O.:840] Out: -  Intake/Output this shift: No intake/output data recorded.  General appearance: uncomfortable drinking prep GI: some minor abdominal distension  Lab Results: Recent Labs    01/22/23 0549 01/22/23 1722 01/23/23 0456  WBC 5.2 4.9 4.9  HGB 8.0* 8.3* 8.9*  HCT 24.7* 26.2* 27.8*  PLT 190 198 232   BMET Recent Labs    01/21/23 0538 01/22/23 0549 01/23/23 0456  NA 139 137 137  K 3.7 3.5 3.8  CL 100 103 101  CO2 28 24 22   GLUCOSE 100* 95 101*  BUN 10 10 6*  CREATININE 0.85 0.75 0.76  CALCIUM 9.1 8.6* 9.2   LFT No results for input(s): "PROT", "ALBUMIN", "AST", "ALT", "ALKPHOS", "BILITOT", "BILIDIR", "IBILI" in the last 72 hours. PT/INR Recent Labs    01/22/23 0549 01/23/23 0456  LABPROT 18.9* 16.7*  INR 1.6* 1.3*   Hepatitis Panel Recent Labs    01/21/23 2047  HEPBSAG NON REACTIVE  HCVAB NON REACTIVE  HEPAIGM NON REACTIVE  HEPBIGM NON REACTIVE   C-Diff No results for input(s): "CDIFFTOX" in the last 72 hours. Fecal Lactopherrin No results for input(s): "FECLLACTOFRN" in the last 72 hours.  Studies/Results: No results found.  Medications: Scheduled:  [MAR Hold] allopurinol  300 mg Oral Daily   [MAR Hold] carvedilol  6.25 mg Oral BID WC   [MAR Hold] DULoxetine  30 mg Oral Daily   [MAR Hold] loratadine  10 mg Oral Daily   [MAR Hold] multivitamin with minerals  1 tablet Oral Daily   [MAR Hold] pantoprazole  40 mg Oral Daily   [MAR Hold] traZODone  100 mg Oral QHS   Continuous:  sodium chloride 20 mL/hr at 01/23/23 0409    lactated ringers 20 mL/hr at 01/23/23 1027    Assessment/Plan: 1) IDA. 2) Probable duodenal ulcer.  Plan: 1) EGD/colonoscopy tomorrow.  LOS: 4 days   Lucas Bowman D 01/23/2023, 10:30 AM

## 2023-01-23 NOTE — Plan of Care (Signed)
°  Problem: Clinical Measurements: °Goal: Ability to maintain clinical measurements within normal limits will improve °Outcome: Progressing °  °Problem: Nutrition: °Goal: Adequate nutrition will be maintained °Outcome: Progressing °  °Problem: Coping: °Goal: Level of anxiety will decrease °Outcome: Progressing °  °Problem: Pain Managment: °Goal: General experience of comfort will improve °Outcome: Progressing °  °

## 2023-01-23 NOTE — Anesthesia Preprocedure Evaluation (Addendum)
Anesthesia Evaluation  Patient identified by MRN, date of birth, ID band Patient awake    Reviewed: Allergy & Precautions, NPO status , Patient's Chart, lab work & pertinent test results  Airway Mallampati: I  TM Distance: >3 FB Neck ROM: Full    Dental no notable dental hx.    Pulmonary sleep apnea and Continuous Positive Airway Pressure Ventilation    Pulmonary exam normal        Cardiovascular hypertension, Pt. on medications and Pt. on home beta blockers Normal cardiovascular exam     Neuro/Psych  PSYCHIATRIC DISORDERS  Depression     Neuromuscular disease    GI/Hepatic Neg liver ROS, hiatal hernia, Bowel prep,GERD  Medicated,,  Endo/Other  negative endocrine ROS    Renal/GU negative Renal ROS     Musculoskeletal  (+) Arthritis ,    Abdominal   Peds  Hematology  (+) Blood dyscrasia (Warfarin), anemia   Anesthesia Other Findings IDA  Reproductive/Obstetrics                             Anesthesia Physical Anesthesia Plan  ASA: 3  Anesthesia Plan: MAC   Post-op Pain Management:    Induction: Intravenous  PONV Risk Score and Plan: 1 and Propofol infusion and Treatment may vary due to age or medical condition  Airway Management Planned: Nasal Cannula  Additional Equipment:   Intra-op Plan:   Post-operative Plan:   Informed Consent: I have reviewed the patients History and Physical, chart, labs and discussed the procedure including the risks, benefits and alternatives for the proposed anesthesia with the patient or authorized representative who has indicated his/her understanding and acceptance.     Dental advisory given  Plan Discussed with: CRNA  Anesthesia Plan Comments:        Anesthesia Quick Evaluation

## 2023-01-23 NOTE — Op Note (Signed)
[facility] Patient Name: Lucas Bowman Procedure Date : 01/23/2023 MRN: [id number] Attending MD: Jeani Hawking , MD, [id number] Date of Birth: [birth year] CSN: [id number] Age: 72 Admit Type: Inpatient Procedure:                Upper GI endoscopy Indications:              Iron deficiency anemia Providers:                Jeani Hawking, MD, Rozetta Nunnery, Technician,                            Fransisca Connors Referring MD:              Medicines:                Propofol per Anesthesia Complications:            No immediate complications. Estimated Blood Loss:     Estimated blood loss: none. Procedure:                Pre-Anesthesia Assessment:                           - Prior to the procedure, a History and Physical                            was performed, and patient medications and                            allergies were reviewed. The patient's tolerance of                            previous anesthesia was also reviewed. The risks                            and benefits of the procedure and the sedation                            options and risks were discussed with the patient.                            All questions were answered, and informed consent                            was obtained. Prior Anticoagulants: The patient has                            taken Coumadin (warfarin), last dose was 5 days                            prior to procedure. ASA Grade Assessment: III - A                            patient with severe systemic disease. After  reviewing the risks and benefits, the patient was                            deemed in satisfactory condition to undergo the                            procedure.                           - Sedation was administered by an anesthesia                            professional. Deep sedation was attained.                           After obtaining informed consent, the endoscope was                             passed under direct vision. Throughout the                            procedure, the patient's blood pressure, pulse, and                            oxygen saturations were monitored                            continuously.[id number] The [id number] [phone])                            Olympus pediatric colonoscope was introduced                            through the mouth, and advanced to the second part                            of duodenum. The upper GI endoscopy was                            accomplished without difficulty. The patient                            tolerated the procedure well. Scope In: Scope Out: Findings:      The esophagus was normal.      A few small sessile polyps with bleeding and stigmata of recent bleeding       were found in the gastric fundus and in the gastric body. The polyp was       removed with a hot snare. Resection and retrieval were complete.      The examined duodenum was normal.      There was no evidence of any uclerations, erosions, or inflammation in       the duodenum. In the gastric lumen hematin was noted next to a small       polyp and one small sessile polyp was bleeding. The bleeding polyp was       removed  with a hot snare and the other polyp was ablated with the tip of       the snare. Another nonbleeding suspicious bleeding polyp was ablated. Impression:               - Normal esophagus.                           - A few gastric polyps. Resected and retrieved.                           - Normal examined duodenum. Recommendation:           - Return patient to hospital ward for ongoing care.                           - Resume regular diet.                           - Continue present medications.                           - Await pathology results.                           - Restart coumadin.                           - [facility] to resume care in the AM. Procedure Code(s):        --- Professional ---                            [phone], Esophagogastroduodenoscopy, flexible,                            transoral; with removal of tumor(s), polyp(s), or                            other lesion(s) by snare technique Diagnosis Code(s):        --- Professional ---                           K31.7, Polyp of stomach and duodenum                           D50.9, Iron deficiency anemia, unspecified CPT copyright 2022 American Medical Association. All rights reserved. The codes documented in this report are preliminary and upon coder review may  be revised to meet current compliance requirements. Jeani Hawking, MD Jeani Hawking, MD 01/23/2023 11:54:54 AM This report has been signed electronically. Number of Addenda: 0

## 2023-01-23 NOTE — Progress Notes (Signed)
ANTICOAGULATION CONSULT NOTE   Pharmacy Consult for heparin  Indication: atrial fibrillation/mechanical valve  Allergies  Allergen Reactions   Other     Pneumonia shot-redness, swelling   Sulfonamide Derivatives Other (See Comments)    Patient Measurements: Height: 5\' 8"  (172.7 cm) Weight: 75.7 kg (166 lb 14.2 oz) IBW/kg (Calculated) : 68.4 Heparin Dosing Weight: 75.7 kg  Vital Signs: Temp: 97.6 F (36.4 C) (07/28 0800) Temp Source: Oral (07/28 0800) BP: 138/65 (07/28 1220) Pulse Rate: 87 (07/28 1220)  Labs: Recent Labs    01/21/23 0538 01/21/23 1604 01/22/23 0549 01/22/23 1722 01/23/23 0456  HGB 8.7*   < > 8.0* 8.3* 8.9*  HCT 28.0*   < > 24.7* 26.2* 27.8*  PLT 183   < > 190 198 232  LABPROT 21.7*  --  18.9*  --  16.7*  INR 1.9*  --  1.6*  --  1.3*  HEPARINUNFRC  --   --   --  0.24*  --   CREATININE 0.85  --  0.75  --  0.76   < > = values in this interval not displayed.    Estimated Creatinine Clearance: 79.6 mL/min (by C-G formula based on SCr of 0.76 mg/dL).   Medical History: Past Medical History:  Diagnosis Date   Anemia    Depression    Gastritis    GERD (gastroesophageal reflux disease)    Gout    H/O aortic valve replacement    Hemorrhoids    Hiatal hernia    Internal hemorrhoids    Irritable bowel syndrome    Mitral insufficiency    Neck pain    Septic shock (HCC) 03/06/2009   Sleep apnea    Tubular adenoma     Medications:  Scheduled:   allopurinol  300 mg Oral Daily   carvedilol  6.25 mg Oral BID WC   DULoxetine  30 mg Oral Daily   loratadine  10 mg Oral Daily   multivitamin with minerals  1 tablet Oral Daily   pantoprazole  40 mg Oral Daily   traZODone  100 mg Oral QHS   Infusions:   heparin     lactated ringers 20 mL/hr at 01/23/23 1027   PRN: acetaminophen **OR** acetaminophen, albuterol, cyclobenzaprine, HYDROcodone-acetaminophen, HYDROmorphone (DILAUDID) injection, ondansetron **OR** ondansetron (ZOFRAN)  IV  Assessment: KD, is a 72 YO M, presenting with an upper GI bleed with a PMH of a mechanical AVR, mitral valve repair / annuloplasty in 2001, A.Fib (on warfarin), and HTN. Patient's last warfarin dose was on 7/22 and anticoagulation therapy has been held until patient's INR < 2. Pts current INR is 1.6 (7/27). Pts hgb and plts are stable and improved since admission with no additional S/sx of bleeding. Heparin was started on 7/27 and held for colonoscopy and endoscopy procedure on 01/23/23. It is appropriate to resume heparin at 1800 tonight (7/28).   Goal of Therapy:  Heparin level 0.3-0.7 units/ml Monitor platelets by anticoagulation protocol: Yes   Plan:  Restart heparin infusion at 1250 units/hr Check anti-Xa level in 8 hours and daily while on heparin Continue to monitor H&H and platelets Monitor s/sx of bleeding   Roslyn Smiling, PharmD PGY1 Pharmacy Resident 01/23/2023 1:47 PM

## 2023-01-24 DIAGNOSIS — Z952 Presence of prosthetic heart valve: Secondary | ICD-10-CM | POA: Diagnosis not present

## 2023-01-24 DIAGNOSIS — K7460 Unspecified cirrhosis of liver: Secondary | ICD-10-CM | POA: Diagnosis not present

## 2023-01-24 DIAGNOSIS — Z5181 Encounter for therapeutic drug level monitoring: Secondary | ICD-10-CM | POA: Diagnosis not present

## 2023-01-24 DIAGNOSIS — I482 Chronic atrial fibrillation, unspecified: Secondary | ICD-10-CM | POA: Diagnosis not present

## 2023-01-24 DIAGNOSIS — I4819 Other persistent atrial fibrillation: Secondary | ICD-10-CM | POA: Diagnosis not present

## 2023-01-24 DIAGNOSIS — Z7901 Long term (current) use of anticoagulants: Secondary | ICD-10-CM | POA: Diagnosis not present

## 2023-01-24 DIAGNOSIS — K922 Gastrointestinal hemorrhage, unspecified: Secondary | ICD-10-CM | POA: Diagnosis not present

## 2023-01-24 DIAGNOSIS — D62 Acute posthemorrhagic anemia: Secondary | ICD-10-CM | POA: Diagnosis not present

## 2023-01-24 DIAGNOSIS — D649 Anemia, unspecified: Secondary | ICD-10-CM | POA: Diagnosis not present

## 2023-01-24 LAB — TYPE AND SCREEN
ABO/RH(D): O POS
Antibody Screen: NEGATIVE
Unit division: 0

## 2023-01-24 LAB — CBC
HCT: 24.8 % — ABNORMAL LOW (ref 39.0–52.0)
Hemoglobin: 7.9 g/dL — ABNORMAL LOW (ref 13.0–17.0)
MCH: 27.8 pg (ref 26.0–34.0)
MCHC: 31.9 g/dL (ref 30.0–36.0)
MCV: 87.3 fL (ref 80.0–100.0)
Platelets: 210 10*3/uL (ref 150–400)
RBC: 2.84 MIL/uL — ABNORMAL LOW (ref 4.22–5.81)
RDW: 16.1 % — ABNORMAL HIGH (ref 11.5–15.5)
WBC: 4.5 10*3/uL (ref 4.0–10.5)
nRBC: 0 % (ref 0.0–0.2)

## 2023-01-24 LAB — BPAM RBC
Blood Product Expiration Date: 202408302359
ISSUE DATE / TIME: 202407291810
Unit Type and Rh: 5100

## 2023-01-24 LAB — TSH: TSH: 2.202 u[IU]/mL (ref 0.350–4.500)

## 2023-01-24 LAB — HEMOGLOBIN AND HEMATOCRIT, BLOOD
HCT: 28.2 % — ABNORMAL LOW (ref 39.0–52.0)
Hemoglobin: 8.9 g/dL — ABNORMAL LOW (ref 13.0–17.0)

## 2023-01-24 LAB — IRON AND TIBC
Iron: 5 ug/dL — ABNORMAL LOW (ref 45–182)
Saturation Ratios: 2 % — ABNORMAL LOW (ref 17.9–39.5)
TIBC: 342 ug/dL (ref 250–450)
UIBC: 337 ug/dL

## 2023-01-24 LAB — HEPARIN LEVEL (UNFRACTIONATED): Heparin Unfractionated: 0.31 IU/mL (ref 0.30–0.70)

## 2023-01-24 LAB — PREPARE RBC (CROSSMATCH)

## 2023-01-24 LAB — VITAMIN B12: Vitamin B-12: 750 pg/mL (ref 180–914)

## 2023-01-24 MED ORDER — SODIUM CHLORIDE 0.9% IV SOLUTION: Freq: Once | INTRAVENOUS | Status: AC

## 2023-01-24 MED ORDER — FLUTICASONE PROPIONATE 50 MCG/ACT NA SUSP
2.0000 | Freq: Every day | NASAL | Status: DC
  Administered 2023-01-24 – 2023-01-25 (×2): 2 via NASAL
  Filled 2023-01-24: qty 16

## 2023-01-24 MED ORDER — WARFARIN SODIUM 7.5 MG PO TABS
7.5000 mg | ORAL_TABLET | Freq: Once | ORAL | Status: AC
  Administered 2023-01-24: 7.5 mg via ORAL
  Filled 2023-01-24: qty 1

## 2023-01-24 NOTE — Progress Notes (Signed)
Subjective:  Denies SSCP, palpitations or Dyspnea Significant acute/chronic right shoulder pain   Objective:  Vitals:   01/23/23 1220 01/23/23 1935 01/23/23 2338 01/24/23 0353  BP: 138/65 105/71 (!) 110/57 124/73  Pulse: 87 77 82 77  Resp: 18 20 20 18   Temp:  97.8 F (36.6 C) 97.8 F (36.6 C) 97.8 F (36.6 C)  TempSrc:  Oral Oral Oral  SpO2: 100% 100% 94% 96%  Weight:      Height:        Intake/Output from previous day:  Intake/Output Summary (Last 24 hours) at 01/24/2023 0901 Last data filed at 01/24/2023 0758 Gross per 24 hour  Intake 418.03 ml  Output --  Net 418.03 ml    Physical Exam:  Pale white male Lungs clear Mechanical AVR with normal clicks and no AR Abdomen benign Lungs clear No edema  Lab Results: Basic Metabolic Panel: Recent Labs    01/23/23 0456 01/24/23 0700  NA 137 139  K 3.8 3.5  CL 101 108  CO2 22 25  GLUCOSE 101* 90  BUN 6* 5*  CREATININE 0.76 0.74  CALCIUM 9.2 8.7*   Liver Function Tests: No results for input(s): "AST", "ALT", "ALKPHOS", "BILITOT", "PROT", "ALBUMIN" in the last 72 hours. No results for input(s): "LIPASE", "AMYLASE" in the last 72 hours. CBC: Recent Labs    01/23/23 0456 01/24/23 0659  WBC 4.9 4.5  HGB 8.9* 7.9*  HCT 27.8* 24.8*  MCV 85.5 87.3  PLT 232 210   Cardiac Enzymes: No results for input(s): "CKTOTAL", "CKMB", "CKMBINDEX", "TROPONINI" in the last 72 hours. BNP: Invalid input(s): "POCBNP" D-Dimer: No results for input(s): "DDIMER" in the last 72 hours. Hemoglobin A1C: No results for input(s): "HGBA1C" in the last 72 hours. Fasting Lipid Panel: No results for input(s): "CHOL", "HDL", "LDLCALC", "TRIG", "CHOLHDL", "LDLDIRECT" in the last 72 hours. Thyroid Function Tests: No results for input(s): "TSH", "T4TOTAL", "T3FREE", "THYROIDAB" in the last 72 hours.  Invalid input(s): "FREET3" Anemia Panel: No results for input(s): "VITAMINB12", "FOLATE", "FERRITIN", "TIBC", "IRON", "RETICCTPCT" in  the last 72 hours.  Imaging: Imaging results have been reviewed  Cardiac Studies:  ECG:  Orders placed or performed during the hospital encounter of 04/04/14   EKG 12-Lead   EKG 12-Lead     Telemetry:  Echo:   Medications:    allopurinol  300 mg Oral Daily   carvedilol  6.25 mg Oral BID WC   DULoxetine  30 mg Oral Daily   loratadine  10 mg Oral Daily   multivitamin with minerals  1 tablet Oral Daily   pantoprazole  40 mg Oral Daily   traZODone  100 mg Oral QHS   warfarin  7.5 mg Oral ONCE-1600   Warfarin - Pharmacist Dosing Inpatient   Does not apply q1600      heparin 1,250 Units/hr (01/24/23 0758)   lactated ringers 20 mL/hr at 01/23/23 1027    Assessment/Plan:   Anticoagulation:  GI bleed post endo with polyps in stomach and colon back on coumadin INR 1.5 continue heparin until INR 2.2 He has used lovenox shots at home before. Normal coumadin dose 5 mg daily and 2.5 mg Thurs/Sunday. His primary follows his INR q 4 weeks  AVR  normal valve sounds he follows with cardiology in [place]  Anemia:  Hct low 24.8 with Hb 7.9 consider transfusion given need for anticoagulation Bleeding source presumed polyps  Shoulder:  right needs MRI and f/u ortho   Charlton Haws 01/24/2023, 9:01 AM

## 2023-01-24 NOTE — Progress Notes (Addendum)
ANTICOAGULATION CONSULT NOTE   Pharmacy Consult for warfarin/heparin Indication: atrial fibrillation/mechanical valve  Allergies  Allergen Reactions   Other     Pneumonia shot-redness, swelling   Sulfonamide Derivatives Other (See Comments)    Patient Measurements: Height: 5\' 8"  (172.7 cm) Weight: 75.7 kg (166 lb 14.2 oz) IBW/kg (Calculated) : 68.4  Vital Signs: Temp: 97.8 F (36.6 C) (07/29 0353) Temp Source: Oral (07/29 0353) BP: 124/73 (07/29 0353) Pulse Rate: 77 (07/29 0353)  Labs: Recent Labs    01/22/23 0549 01/22/23 1722 01/23/23 0456 01/24/23 0659 01/24/23 0700 01/24/23 0709  HGB 8.0* 8.3* 8.9* 7.9*  --   --   HCT 24.7* 26.2* 27.8* 24.8*  --   --   PLT 190 198 232 210  --   --   LABPROT 18.9*  --  16.7*  --  18.2*  --   INR 1.6*  --  1.3*  --  1.5*  --   HEPARINUNFRC  --  0.24*  --   --   --  0.31  CREATININE 0.75  --  0.76  --  0.74  --     Estimated Creatinine Clearance: 79.6 mL/min (by C-G formula based on SCr of 0.74 mg/dL).   Medical History: Past Medical History:  Diagnosis Date   Anemia    Depression    Gastritis    GERD (gastroesophageal reflux disease)    Gout    H/O aortic valve replacement    Hemorrhoids    Hiatal hernia    Internal hemorrhoids    Irritable bowel syndrome    Mitral insufficiency    Neck pain    Septic shock (HCC) 03/06/2009   Sleep apnea    Tubular adenoma     Medications:  Scheduled:   allopurinol  300 mg Oral Daily   carvedilol  6.25 mg Oral BID WC   DULoxetine  30 mg Oral Daily   loratadine  10 mg Oral Daily   multivitamin with minerals  1 tablet Oral Daily   pantoprazole  40 mg Oral Daily   traZODone  100 mg Oral QHS   warfarin  7.5 mg Oral ONCE-1600   Warfarin - Pharmacist Dosing Inpatient   Does not apply q1600   Infusions:   heparin 1,250 Units/hr (01/24/23 0758)   lactated ringers 20 mL/hr at 01/23/23 1027   PRN: acetaminophen **OR** acetaminophen, albuterol, cyclobenzaprine,  HYDROcodone-acetaminophen, HYDROmorphone (DILAUDID) injection, ondansetron **OR** ondansetron (ZOFRAN) IV  Assessment: KD, is a 72 YO M, presenting with an upper GI bleed with a PMH of a mechanical AVR, mitral valve repair / annuloplasty in 2001, A.Fib (on warfarin), and HTN. Patient's last warfarin dose was on 7/22 and anticoagulation therapy has been held until patient's INR < 2. Pts current INR is 1.6 (7/27). Pts hgb and plts are stable and improved since admission with no additional S/sx of bleeding. Heparin was started on 7/27 and held for colonoscopy and endoscopy procedure on 01/23/23. Heparin and warfarin are to be resumed at 1800 tonight (7/28).   Heparin level came back therapeutic this AM but on the lower end of goal. INR 1.5. We will give a slightly higher dose of coumadin today and increase heparin slightly.  Hgb 7.9, plt wnl  Goal of Therapy:  INR 2.5-3.5  - Pt unable to confirm goal - will attempt to follow-up with outpatient provider Heparin level 0.3-0.5 Monitor platelets by anticoagulation protocol: Yes   Plan:  Warfarin 7.5 mg x 1 dose Increase heparin to 1300 units/hr  Check INR/heparin level daily  Continue to monitor H&H and platelets  Ulyses Southward, PharmD, BCIDP, AAHIVP, CPP Infectious Disease Pharmacist 01/24/2023 8:50 AM

## 2023-01-24 NOTE — Progress Notes (Addendum)
Progress Note  Primary GI: [facility] DOA: 01/18/2023         Hospital Day: 7   Subjective  Chief Complaint: GI bleed  No family was present at the time of my evaluation. Had BM this AM and another a few hours ago, dark but less than before.  Has mild lower AB pain.  No nausea, vomiting.    Objective   Vital signs in last 24 hours: Temp:  [97.8 F (36.6 C)-98.1 F (36.7 C)] 98.1 F (36.7 C) (07/29 1215) Pulse Rate:  [77-89] 77 (07/29 1215) Resp:  [18-24] 20 (07/29 1215) BP: (105-124)/(57-81) 115/81 (07/29 1215) SpO2:  [94 %-100 %] 98 % (07/29 1215) Last BM Date : 01/23/23 Last BM recorded by nurses in past 5 days Stool Type: Type 4 (Like a smooth, soft sausage or snake) (01/24/2023 11:09 AM)  General: chronically ill appearing  male in no acute distress, pallor Heart:  Regular rate and rhythm; no murmurs Pulm: Clear anteriorly; no wheezing Abdomen:  Soft, Obese AB, Active bowel sounds. mild tenderness in the lower abdomen. Without guarding and Without rebound, No organomegaly appreciated. Extremities:  without  edema. Neurologic:  Alert and  oriented x4;  No focal deficits.  Psych:  Cooperative. Normal mood and affect.  Intake/Output from previous day: 07/28 0701 - 07/29 0700 In: 250 [I.V.:250] Out: -  Intake/Output this shift: Total I/O In: 168 [I.V.:168] Out: -   Studies/Results: No results found.  Lab Results: Recent Labs    01/22/23 1722 01/23/23 0456 01/24/23 0659  WBC 4.9 4.9 4.5  HGB 8.3* 8.9* 7.9*  HCT 26.2* 27.8* 24.8*  PLT 198 232 210   BMET Recent Labs    01/22/23 0549 01/23/23 0456 01/24/23 0700  NA 137 137 139  K 3.5 3.8 3.5  CL 103 101 108  CO2 24 22 25   GLUCOSE 95 101* 90  BUN 10 6* 5*  CREATININE 0.75 0.76 0.74  CALCIUM 8.6* 9.2 8.7*   LFT No results for input(s): "PROT", "ALBUMIN", "AST", "ALT", "ALKPHOS", "BILITOT", "BILIDIR", "IBILI" in the last 72 hours. 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 Elnoria Howard EGD small sessile polyps with bleeding and stigmata of recent bleeding in gastric fundus and gastric body status post removal hot snare, normal duodenum, no evidence of ulceration, erosions or inflammation.   Pending pathology. Colonoscopy with 6 polyps 2 to 4 mm transverse colon ascending colon,  diverticulosis.  Recall 3 years.  Pending pathology. Hemoglobin 8.3, to 8.9 currently 7.9 with continuing dark stool but less than previously.  Could be residual.  -Most likely bleeding secondary to gastric polyps status post ablation -Has some minor decrease hemoglobin 8.3-8.9 7.9, likely more residual/equalization -Patient continues to have transfusion dependent anemia, acute GI bleeding, can consider repeat EGD/enteroscopy versus capsule endoscopy. -Continue heparin infusion for now, restart Coumadin unknown, per cardiology versus Dr. Leonides Schanz -Continue to follow along for now. -Continue supportive care monitor CBC transfuse to keep greater than 7  Cirrohosis on  CTA No evidence of esophageal varices, no portal hypertension. Negative acute hepatitis panel 7/26, normal ammonia Normal LFTs -No history of significant drinking, patient used to be heavier send prior to weight loss, no previous neurological workup -Will get, iron, ferritin, AMA, ASMA, alpha-1 antitrypsin, ANA, IgG  S/p mechanical AVR and mitral valve repair on Coumadin INR currently 1.5 patient on heparin infusion   Aneurysm of ascending aorta   Persistent afib   Principal Problem:   UGIB (upper gastrointestinal bleed) Active Problems:   Essential hypertension   HEART VALVE REPLACEMENT, HX OF   Cirrhosis (HCC)   Acute pancreatitis   MCI (mild cognitive impairment)   Chronic a-fib (HCC)   ABLA (acute blood loss anemia)   Current use of long term anticoagulation   H/O mechanical aortic valve replacement   Symptomatic anemia   Family history of colon cancer    LOS: 5 days   Doree Albee  01/24/2023, 1:22 PM

## 2023-01-24 NOTE — Progress Notes (Signed)
Physical Therapy Treatment Patient Details Name: Lucas Bowman MRN: [id number] DOB: 11-15-1950 Today's Date: 01/24/2023   History of Present Illness 72 y.o. male presents to [facility] hospital on 01/18/2023 with GIB with hematemesis, maroon stool and presyncopal symptoms. Pt diagnosed with cirrhosis and acute pancreatitis. PMH includes mechanical AVR, afib, HTN.    PT Comments  Patient received standing in room with NT, preparing to brush teeth. Patient requires supervision for mobility. He has good balance, but decreased awareness, generally reporting not feeling well, but difficulty elaborating. Patient with slow response time, slowed processing. He only needs supervision for all mobility without AD. Patient will continue to benefit from skilled PT to ensure safety with mobility.      If plan is discharge home, recommend the following: A little help with bathing/dressing/bathroom;Assistance with cooking/housework;Direct supervision/assist for medications management;Direct supervision/assist for financial management;Assist for transportation   Can travel by private vehicle        Equipment Recommendations  None recommended by PT    Recommendations for Other Services       Precautions / Restrictions Precautions Precautions: Fall Precaution Comments: cognitive impairment Restrictions Weight Bearing Restrictions: No     Mobility  Bed Mobility               General bed mobility comments: NT, patient received standing in room with NT present    Transfers Overall transfer level: Needs assistance Equipment used: None Transfers: Sit to/from Stand Sit to Stand: Supervision           General transfer comment: minguard for safety    Ambulation/Gait Ambulation/Gait assistance: Supervision Gait Distance (Feet): 100 Feet Assistive device: None Gait Pattern/deviations: Step-through pattern, Decreased stride length Gait velocity: decreased     General Gait Details: slowed  step-through gait   Stairs             Wheelchair Mobility     Tilt Bed    Modified Rankin (Stroke Patients Only)       Balance Overall balance assessment: Mild deficits observed, not formally tested Sitting-balance support: Feet supported, No upper extremity supported Sitting balance-Leahy Scale: Normal     Standing balance support: During functional activity, No upper extremity supported Standing balance-Leahy Scale: Good                              Cognition Arousal/Alertness: Awake/alert Behavior During Therapy: WFL for tasks assessed/performed Overall Cognitive Status: History of cognitive impairments - at baseline                                 General Comments: tangential thought, slowed processing and problem solving, Poor attention.        Exercises      General Comments        Pertinent Vitals/Pain Pain Assessment Pain Assessment: Faces Pain Location: shoulder and neck Pain Descriptors / Indicators: Sore Pain Intervention(s): Monitored during session, Premedicated before session, Repositioned    Home Living                          Prior Function            PT Goals (current goals can now be found in the care plan section) Acute Rehab PT Goals Patient Stated Goal: to go home PT Goal Formulation: With patient Time For Goal Achievement: 02/05/23 Potential to  Achieve Goals: Good Additional Goals Additional Goal #1: Pt will score >19/24 on the DGI to indicate a reduced risk for falls Progress towards PT goals: Progressing toward goals    Frequency    Min 1X/week      PT Plan Current plan remains appropriate    Co-evaluation              AM-PAC PT "6 Clicks" Mobility   Outcome Measure  Help needed turning from your back to your side while in a flat bed without using bedrails?: None Help needed moving from lying on your back to sitting on the side of a flat bed without using  bedrails?: A Little Help needed moving to and from a bed to a chair (including a wheelchair)?: A Little Help needed standing up from a chair using your arms (e.g., wheelchair or bedside chair)?: A Little Help needed to walk in hospital room?: A Little Help needed climbing 3-5 steps with a railing? : A Little 6 Click Score: 19    End of Session   Activity Tolerance: Patient limited by fatigue;Patient limited by pain Patient left: in chair;with call bell/phone within reach Nurse Communication: Mobility status PT Visit Diagnosis: Other abnormalities of gait and mobility (R26.89);Muscle weakness (generalized) (M62.81)     Time: 1610-9604 PT Time Calculation (min) (ACUTE ONLY): 18 min  Charges:    $Gait Training: 8-22 mins PT General Charges $$ ACUTE PT VISIT: 1 Visit                     Kem Parcher, PT, GCS 01/24/23,11:39 AM

## 2023-01-24 NOTE — Progress Notes (Signed)
PROGRESS NOTE    Lucas Bowman  UVO:[id number] DOB: Jan 09, 1951 DOA: 01/18/2023 PCP: Kathlee Nations, MD   Chief Complaint  Patient presents with   Emesis   Diarrhea    Brief Narrative:    Lucas Bowman is a 72 y.o. male with medical history significant of Mechanical AVR, mitral valve repair / annuloplasty in 2001, A.Fib, on chronic coumadin for mechanical valve, HTN.  - Pt in to ED with onset of GIB with hematemesis ,  Maroon stools , Has had presyncope with this and fatigue.with h/o chronic afib and mechanical AVR on chronic warfarin , seen by GI,GI recommended holding coumadin and let INR drift down to plan EGD/Colon once INR<1.5.  [name] remains 1.9 today, received total 4 units PRBC during hospital stay.    Assessment & Plan:   Principal Problem:   UGIB (upper gastrointestinal bleed) Active Problems:   ABLA (acute blood loss anemia)   HEART VALVE REPLACEMENT, HX OF   Acute pancreatitis   Chronic a-fib (HCC)   Essential hypertension   Cirrhosis (HCC)   MCI (mild cognitive impairment)   Current use of long term anticoagulation   H/O mechanical aortic valve replacement   Symptomatic anemia   Family history of colon cancer    GIB ( gastrointestinal bleed) Acute blood loss anemia - new diagnosis of cirrhosis, GI input greatly appreciated, plan for endoscopy once INR less than 1.5. -presents with hemoglobin 4.9 on admission, received 4 units, 4 hemoglobin remained stable -GI input greatly appreciated, went for endoscopy/colonoscopy 7/28, was significant for gastric polyp status postresection, and 6 polyps in colon s/p resection as well. -Evidence of cirrhosis on imaging, per GI, hepatitis panel is negative -Hemoglobin is 7.9 this morning, will transfuse 1 unit PRBC per cardiology recommendation given he is on anticoagulation   Chronic a-fib (HCC) -Warfarin has been held initially in anticipation for procedure, now back on warfarin, continue with heparin GTT bridge   -Continue with Coreg   Cirrhosis on CTA Normal LFTs No previous abdominal imaging. Denies alcohol use.  Negative hepatitis panel   S/p mechanical AVR and mitral valve repair on Coumadin PT 28.6/INR 2.7 on admission.   -esume warfarin, meanwhile well bridge with  heparin gtt.   Patient with finding of abdominal pancreatitis on imaging - Question of acute pancreatitis. - Being called on imaging study; however, Lipase normal, only having minimal epigastric discomfort. - No EtOH intake - H/o cholecystectomy in past  History of mechanical heart valve replacement - [employer] mechanical AVR. 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 -Morning within normal limits    Essential hypertension Blood pressure initially soft, now improved, back on Coreg, continue to hold Lotensin    DVT prophylaxis: INR is 2  Code Status: Full Family Communication: None at bedside Disposition:   Status is: Inpatient  Consultants:  Cardiology GI   Subjective:  No significant events overnight, he reports 1 BM overnight, was watery, dark in color.  Objective: Vitals:   01/24/23 0353 01/24/23 0819 01/24/23 0900 01/24/23 1215  BP: 124/73 121/73  115/81  Pulse: 77 83 89 77  Resp: 18 (!) 21 (!) 24 20  Temp: 97.8 F (36.6 C) 97.9 F (36.6 C)  98.1 F (36.7 C)  TempSrc: Oral Oral  Oral  SpO2: 96% 100%  97% 98%  Weight:      Height:        Intake/Output Summary (Last 24 hours) at 01/24/2023 1503 Last data filed at 01/24/2023 0758 Gross per 24 hour  Intake 168.03 ml  Output --  Net 168.03 ml   Filed Weights   01/18/23 1833 01/19/23 1827  Weight: 77.1 kg 75.7 kg    Examination:  Awake Alert, Oriented X 3, No new F.N deficits, Normal affect Symmetrical Chest wall  movement, Good air movement bilaterally, CTAB Irregular , mechanical click present +ve B.Sounds, Abd Soft, No tenderness, No rebound - guarding or rigidity. No Cyanosis, Clubbing or edema, No new Rash or bruise        Data Reviewed: I have personally reviewed following labs and imaging studies  CBC: Recent Labs  Lab 01/18/23 1845 01/18/23 1851 01/21/23 1604 01/22/23 0549 01/22/23 1722 01/23/23 0456 01/24/23 0659  WBC 7.2   < > 5.5 5.2 4.9 4.9 4.5  NEUTROABS 4.7  --   --   --   --   --   --   HGB 4.9*   < > 8.4* 8.0* 8.3* 8.9* 7.9*  HCT 15.5*   < > 26.6* 24.7* 26.2* 27.8* 24.8*  MCV 86.6   < > 88.7 87.9 86.2 85.5 87.3  PLT 198   < > 182 190 198 232 210   < > = values in this interval not displayed.    Basic Metabolic Panel: Recent Labs  Lab 01/20/23 0621 01/21/23 0538 01/22/23 0549 01/23/23 0456 01/24/23 0700  NA 139 139 137 137 139  K 3.8 3.7 3.5 3.8 3.5  CL 105 100 103 101 108  CO2 26 28 24 22 25   GLUCOSE 97 100* 95 101* 90  BUN 16 10 10  6* 5*  CREATININE 0.93 0.85 0.75 0.76 0.74  CALCIUM 8.5* 9.1 8.6* 9.2 8.7*    GFR: Estimated Creatinine Clearance: 79.6 mL/min (by C-G formula based on SCr of 0.74 mg/dL).  Liver Function Tests: Recent Labs  Lab 01/18/23 1845 01/19/23 0730  AST 22 22  ALT 13 14  ALKPHOS 50 46  BILITOT 0.2* 0.9  PROT 5.9* 5.6*  ALBUMIN 3.4* 3.3*    CBG: No results for input(s): "GLUCAP" in the last 168 hours.   No results found for this or any previous visit (from the past 240 hour(s)).       Radiology Studies: No results found.      Scheduled Meds:  allopurinol  300 mg Oral Daily   carvedilol  6.25 mg Oral BID WC   DULoxetine  30 mg Oral Daily   fluticasone  2 spray Each Nare Daily   loratadine  10 mg Oral Daily   multivitamin with minerals  1 tablet Oral Daily   pantoprazole  40 mg Oral Daily   traZODone  100 mg Oral QHS   warfarin  7.5 mg Oral ONCE-1600   Warfarin - Pharmacist Dosing Inpatient   Does not  apply q1600   Continuous Infusions:  heparin 1,300 Units/hr (01/24/23 1002)   lactated ringers 20 mL/hr at 01/23/23 1027     LOS: 5 days     Huey Bienenstock, MD [facility]   To contact the attending provider between 7A-7P or the covering provider during after hours 7P-7A, please log into the web site [email or web address] and access using universal [place] password for that web site. If you do not have the password, please call the hospital operator.  01/24/2023, 3:03 PM

## 2023-01-25 ENCOUNTER — Inpatient Hospital Stay (HOSPITAL_COMMUNITY): Payer: Medicare Other

## 2023-01-25 ENCOUNTER — Other Ambulatory Visit (HOSPITAL_COMMUNITY): Payer: Self-pay

## 2023-01-25 DIAGNOSIS — I350 Nonrheumatic aortic (valve) stenosis: Secondary | ICD-10-CM | POA: Diagnosis not present

## 2023-01-25 DIAGNOSIS — D62 Acute posthemorrhagic anemia: Secondary | ICD-10-CM | POA: Diagnosis not present

## 2023-01-25 DIAGNOSIS — K922 Gastrointestinal hemorrhage, unspecified: Secondary | ICD-10-CM | POA: Diagnosis not present

## 2023-01-25 DIAGNOSIS — Z952 Presence of prosthetic heart valve: Secondary | ICD-10-CM | POA: Diagnosis not present

## 2023-01-25 DIAGNOSIS — Z5181 Encounter for therapeutic drug level monitoring: Secondary | ICD-10-CM | POA: Diagnosis not present

## 2023-01-25 DIAGNOSIS — I482 Chronic atrial fibrillation, unspecified: Secondary | ICD-10-CM | POA: Diagnosis not present

## 2023-01-25 DIAGNOSIS — Z7901 Long term (current) use of anticoagulants: Secondary | ICD-10-CM | POA: Diagnosis not present

## 2023-01-25 MED ORDER — ENOXAPARIN SODIUM 80 MG/0.8ML IJ SOSY
80.0000 mg | PREFILLED_SYRINGE | Freq: Two times a day (BID) | INTRAMUSCULAR | 0 refills | Status: DC
  Filled 2023-01-25: qty 11.2, 7d supply, fill #0

## 2023-01-25 MED ORDER — ENOXAPARIN SODIUM 80 MG/0.8ML IJ SOSY
1.0000 mg/kg | PREFILLED_SYRINGE | Freq: Two times a day (BID) | INTRAMUSCULAR | 0 refills | Status: DC
  Filled 2023-01-25: qty 11.2, 7d supply, fill #0

## 2023-01-25 MED ORDER — WARFARIN SODIUM 7.5 MG PO TABS
7.5000 mg | ORAL_TABLET | Freq: Once | ORAL | Status: DC
  Filled 2023-01-25: qty 1

## 2023-01-25 MED ORDER — ENOXAPARIN SODIUM 80 MG/0.8ML IJ SOSY
1.0000 mg/kg | PREFILLED_SYRINGE | Freq: Two times a day (BID) | INTRAMUSCULAR | Status: DC
  Administered 2023-01-25: 75 mg via SUBCUTANEOUS
  Filled 2023-01-25: qty 0.8

## 2023-01-25 NOTE — TOC Progression Note (Addendum)
Transition of Care [facility]) - Progression Note    Patient Details  Name: Lucas Bowman MRN: [id number] Date of Birth: [birth year]  Transition of Care [facility]) CM/SW Contact  Gordy Clement, RN Phone Number: 01/25/2023, 2:04 PM  Clinical Narrative:     Update:  2:04 PM Skilled Nursing added to referral    Patient being recommended Home Health PT and OT. [facility] has previously worked with this patient  Referral made. No equipment recommendations. TOC will continue to follow patient for any additional discharge needs            Expected Discharge Plan and Services                                               Social Determinants of Health (SDOH) Interventions SDOH Screenings   Tobacco Use: Low Risk  (01/18/2023)  Recent Concern: Tobacco Use - Medium Risk (12/24/2022)   Received from [facility]    Readmission Risk Interventions     No data to display

## 2023-01-25 NOTE — Plan of Care (Signed)
  Problem: Activity: Goal: Risk for activity intolerance will decrease Outcome: Progressing   Problem: Coping: Goal: Level of anxiety will decrease Outcome: Progressing   Problem: Pain Managment: Goal: General experience of comfort will improve Outcome: Progressing   Problem: Skin Integrity: Goal: Risk for impaired skin integrity will decrease Outcome: Progressing   

## 2023-01-25 NOTE — TOC Transition Note (Signed)
Transition of Care [facility]) - CM/SW Discharge Note   Patient Details  Name: Lucas Bowman MRN: [id number] Date of Birth: 1950-10-09  [facility]) CM/SW Contact:  Gordy Clement, RN Phone Number: 01/25/2023, 2:30 PM   Clinical Narrative:     Patient to dc to home today  [facility] will provide services  Spouse to transport  No additional TOC needs           Patient Goals and CMS Choice      Discharge Placement                         Discharge Plan and Services Additional resources added to the After Visit Summary for                                       Social Determinants of Health (SDOH) Interventions SDOH Screenings   Tobacco Use: Low Risk  (01/18/2023)  Recent Concern: Tobacco Use - Medium Risk (12/24/2022)   Received from [facility]     Readmission Risk Interventions     No data to display

## 2023-01-25 NOTE — Progress Notes (Signed)
Occupational Therapy Treatment Patient Details Name: Lucas Bowman MRN: [id number] DOB: 01/11/1951 Today's Date: 01/25/2023   History of present illness 72 y.o. male presents to [facility] hospital on 01/18/2023 with GIB with hematemesis, maroon stool and presyncopal symptoms. Pt diagnosed with cirrhosis and acute pancreatitis. PMH includes mechanical AVR, afib, HTN.   OT comments  Pt making good progress with functional goals. Pt eager to d/c home. Pt walked to bathroom and sink with no AD with Sup, min guard A for safety at toilet and shower. Pt able to follow 3/4 multistep tasks without errors or cues required to initiate and complete. OT will continue to follow acutely to maximize level of function and safety   Recommendations for follow up therapy are one component of a multi-disciplinary discharge planning process, led by the attending physician.  Recommendations may be updated based on patient status, additional functional criteria and insurance authorization.    Assistance Recommended at Discharge Intermittent Supervision/Assistance  Patient can return home with the following  A little help with walking and/or transfers;A little help with bathing/dressing/bathroom;Assist for transportation;Direct supervision/assist for financial management;Direct supervision/assist for medications management   Equipment Recommendations  None recommended by OT    Recommendations for Other Services      Precautions / Restrictions Precautions Precautions: Fall Precaution Comments: cognitive impairment Restrictions Weight Bearing Restrictions: No       Mobility Bed Mobility Overal bed mobility: Needs Assistance Bed Mobility: Supine to Sit     Supine to sit: Independent          Transfers Overall transfer level: Needs assistance Equipment used: None Transfers: Sit to/from Stand Sit to Stand: Supervision           General transfer comment: min guard A for safety in bat[place]room  transferring to toilet and shower stall     Balance Overall balance assessment: Mild deficits observed, not formally tested Sitting-balance support: Feet supported, No upper extremity supported Sitting balance-Leahy Scale: Good     Standing balance support: During functional activity, No upper extremity supported Standing balance-Leahy Scale: Fair                             ADL either performed or assessed with clinical judgement   ADL Overall ADL's : Needs assistance/impaired     Grooming: Wash/dry hands;Wash/dry face;Supervision/safety;Standing                   Toilet Transfer: Min guard;Ambulation;Grab bars   Toileting- Clothing Manipulation and Hygiene: Supervision/safety;Sit to/from stand   Tub/ Shower Transfer: Min guard;Ambulation;Grab bars   Functional mobility during ADLs: Min guard;Supervision/safety General ADL Comments: min guard A for safety. Pt able to follow 3/4 multistep tasks without errors or cues required    Extremity/Trunk Assessment Upper Extremity Assessment Upper Extremity Assessment: Overall WFL for tasks assessed   Lower Extremity Assessment Lower Extremity Assessment: Defer to PT evaluation   Cervical / Trunk Assessment Cervical / Trunk Assessment: Normal    Vision Baseline Vision/History: 0 No visual deficits Ability to See in Adequate Light: 1 Impaired Patient Visual Report: No change from baseline     Perception     Praxis      Cognition Arousal/Alertness: Awake/alert Behavior During Therapy: WFL for tasks assessed/performed Overall Cognitive Status: History of cognitive impairments - at baseline  General Comments: tangential thought, slowed processing and problem solving, Poor attention.        Exercises      Shoulder Instructions       General Comments      Pertinent Vitals/ Pain       Pain Assessment Pain Assessment: 0-10 Pain Score: 4  Pain  Location: shoulder and neck Pain Descriptors / Indicators: Sore Pain Intervention(s): Monitored during session  Home Living                                          Prior Functioning/Environment              Frequency  Min 2X/week        Progress Toward Goals  OT Goals(current goals can now be found in the care plan section)  Progress towards OT goals: Progressing toward goals     Plan Discharge plan remains appropriate    Co-evaluation                 AM-PAC OT "6 Clicks" Daily Activity     Outcome Measure   Help from another person eating meals?: None Help from another person taking care of personal grooming?: A Little Help from another person toileting, which includes using toliet, bedpan, or urinal?: A Little Help from another person bathing (including washing, rinsing, drying)?: A Little Help from another person to put on and taking off regular upper body clothing?: A Little Help from another person to put on and taking off regular lower body clothing?: A Little 6 Click Score: 19    End of Session    OT Visit Diagnosis: Other abnormalities of gait and mobility (R26.89);Muscle weakness (generalized) (M62.81);Other symptoms and signs involving cognitive function   Activity Tolerance Patient tolerated treatment well   Patient Left with call bell/phone within reach;in bed;with family/visitor present (sitting EOB)   Nurse Communication          Time: 2536-6440 OT Time Calculation (min): 28 min  Charges: OT General Charges $OT Visit: 1 Visit OT Treatments $Self Care/Home Management : 8-22 mins $Therapeutic Activity: 8-22 mins    Galen Manila 01/25/2023, 1:00 PM

## 2023-01-25 NOTE — Discharge Summary (Signed)
Physician Discharge Summary  LEGEN EICHEL ZOX:[id number] DOB: 1950-08-30 DOA: 01/18/2023  PCP: Kathlee Nations, MD  Admit date: 01/18/2023 Discharge date: 01/25/2023  Admitted From: (Home) Disposition:  (Home )  Recommendations for Outpatient Follow-up:  Follow up with PCP in 3 days to repeat CBC and INR level, Please follow on final results of lab workup sent for liver cirrhosis Stop Lovenox once INR goal or more than 2. Please follow on the final biopsy results of gastric and colon polyps Home Health: (YES)  Discharge Condition: (Stable) CODE STATUS: (FULL) Diet recommendation: Heart Healthy Brief/Interim Summary:   Lucas Bowman is a 72 y.o. male with medical history significant of Mechanical AVR, mitral valve repair / annuloplasty in 2001, A.Fib, on chronic coumadin for mechanical valve, HTN.  - Pt in to ED with onset of GIB with hematemesis ,  Maroon stools , Has had presyncope with this and fatigue.with h/o chronic afib and mechanical AVR on chronic warfarin , seen by GI,GI recommended holding coumadin and let INR drift down to plan EGD/Colon once INR<1.5.  Will require total 5 units PRBC transfusion during hospital stay, patient went for endoscopy/colonoscopy please see discussion below     GIB ( gastrointestinal bleed) Acute blood loss anemia - new diagnosis of cirrhosis -presents with hemoglobin 4.9 on admission, received 4 units, 4 hemoglobin remained stable -GI input greatly appreciated, went for endoscopy/colonoscopy 7/28, was significant for gastric polyp status postresection, and 6 polyps in colon s/p resection as well. -Patient required total of 5 units PRBC transfusion during hospital stay, his hemoglobin has remained stable -Bleeding felt secondary to multiple polyps -Evidence of cirrhosis on imaging, per GI, hepatitis panel is negative    Chronic a-fib (HCC) -Warfarin has been held initially in anticipation for procedure, now back on warfarin, he was kept on  heparin bridge as well, please see discussion below under mechanical valve section c -Continue with Coreg    Cirrhosis on CTA Normal LFTs No previous abdominal imaging. Denies alcohol use.  Negative hepatitis panel possibly related to NASH, serology workup has been sent, please follow on final results   S/p mechanical AVR and mitral valve repair on Coumadin Warfarin has been held, patient was kept on heparin gtt., now s/p procedure he is on heparin gtt., he is transition to Lovenox today, to continue his home warfarin, and to stop Lovenox once INR equal or more than 2, I have discussed with his daughter, they will arrange for outpatient follow-up with INR and CBC this Friday.   Patient with finding of abdominal pancreatitis on imaging - Question of acute pancreatitis. - Being called on imaging study; however, Lipase normal, only having minimal epigastric discomfort. 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 -Morning within normal limits   Essential hypertension Blood pressure initially soft, now improved, back on Coreg, continue to hold Lotensin   Discharge Diagnoses:  Principal Problem:   UGIB (upper gastrointestinal bleed) Active Problems:   ABLA (acute blood loss anemia)   HEART VALVE REPLACEMENT, HX OF   Acute pancreatitis   Chronic a-fib (HCC)   Essential hypertension   Cirrhosis (HCC)   MCI (mild cognitive impairment)   Current use of long term anticoagulation   H/O mechanical aortic valve replacement   Symptomatic anemia   Family history of colon cancer    Discharge  Instructions  Discharge Instructions     Diet - low sodium heart healthy   Complete by: As directed    Discharge instructions   Complete by: As directed    Follow with Primary MD Kathlee Nations, MD in 7 days   Get  CBC, CMP,  checked  by Primary MD next visit.    Activity: As tolerated with Full fall precautions use walker/cane & assistance as needed   Disposition Home    Diet: Heart Healthy    On your next visit with your primary care physician please Get Medicines reviewed and adjusted.   Please request your Prim.MD to go over all Hospital Tests and Procedure/Radiological results at the follow up, please get all Hospital records sent to your Prim MD by signing hospital release before you go home.   If you experience worsening of your admission symptoms, develop shortness of breath, life threatening emergency, suicidal or homicidal thoughts you must seek medical attention immediately by calling 911 or calling your MD immediately  if symptoms less severe.  You Must read complete instructions/literature along with all the possible adverse reactions/side effects for all the Medicines you take and that have been prescribed to you. Take any new Medicines after you have completely understood and accpet all the possible adverse reactions/side effects.   Do not drive, operating heavy machinery, perform activities at heights, swimming or participation in water activities or provide baby sitting services if your were admitted for syncope or siezures until you have seen by Primary MD or a Neurologist and advised to do so again.  Do not drive when taking Pain medications.    Do not take more than prescribed Pain, Sleep and Anxiety Medications  Special Instructions: If you have smoked or chewed Tobacco  in the last 2 yrs please stop smoking, stop any regular Alcohol  and or any Recreational drug use.  Wear Seat belts while driving.   Please note  You were cared for by a hospitalist during your hospital stay. If you have any questions about your discharge medications or the care you received while you were in the hospital after you are discharged, you can call the unit and asked to speak with the  hospitalist on call if the hospitalist that took care of you is not available. Once you are discharged, your primary care physician will handle any further medical issues. Please note that NO REFILLS for any discharge medications will be authorized once you are discharged, as it is imperative that you return to your primary care physician (or establish a relationship with a primary care physician if you do not have one) for your aftercare needs so that they can reassess your need for medications and monitor your lab values.   Increase activity slowly   Complete by: As directed       Allergies as of 01/25/2023       Reactions   Other    Pneumonia shot-redness, swelling   Sulfonamide Derivatives Other (See Comments)        Medication List     STOP taking these medications    benazepril 5 MG tablet Commonly known as: LOTENSIN       TAKE these medications    albuterol 108 (90 Base) MCG/ACT inhaler Commonly known as: VENTOLIN HFA Inhale 1-2 puffs into the lungs 2 (two) times daily as needed for wheezing or shortness of breath.   allopurinol 300 MG tablet Commonly known as: ZYLOPRIM Take 300 mg by mouth daily.  Bentyl 10 MG capsule Generic drug: dicyclomine Take 10 mg by mouth daily as needed for spasms.   carvedilol 6.25 MG tablet Commonly known as: COREG Take 6.25 mg by mouth 2 (two) times daily with a meal.   cyclobenzaprine 10 MG tablet Commonly known as: FLEXERIL Take 10 mg by mouth at bedtime as needed for muscle spasms.   DULoxetine 30 MG capsule Commonly known as: CYMBALTA Take 30 mg by mouth daily.   enoxaparin 80 MG/0.8ML injection Commonly known as: LOVENOX Inject 0.8 mLs (80 mg total) into the skin every 12 (twelve) hours for 14 doses. Please stop taking Lovenox once your INR is 2 or more   fluticasone 50 MCG/ACT nasal spray Commonly known as: FLONASE Place 2 sprays into both nostrils daily.   furosemide 20 MG tablet Commonly known as: LASIX Take  20 mg by mouth daily.   gemfibrozil 600 MG tablet Commonly known as: LOPID Take 600 mg by mouth daily.   HYDROcodone-acetaminophen 10-325 MG tablet Commonly known as: NORCO Take 1 tablet by mouth every 6 (six) hours as needed for moderate pain.   loratadine 10 MG tablet Commonly known as: CLARITIN Take 10 mg by mouth daily.   multivitamin tablet Take 1 tablet by mouth daily.   naloxone 4 MG/0.1ML Liqd nasal spray kit Commonly known as: NARCAN Place 1 spray into the nose as needed.   nystatin cream Commonly known as: MYCOSTATIN Apply 1 application  topically 2 (two) times daily as needed for dry skin.   ondansetron 4 MG disintegrating tablet Commonly known as: ZOFRAN-ODT Take 4 mg by mouth every 8 (eight) hours as needed for nausea or vomiting.   pantoprazole 40 MG tablet Commonly known as: PROTONIX Take 40 mg by mouth daily.   traZODone 100 MG tablet Commonly known as: DESYREL Take 100 mg by mouth at bedtime.   Vitamin D 50 MCG (2000 UT) Caps Take 1 capsule by mouth daily.   warfarin 5 MG tablet Commonly known as: COUMADIN Take 5 mg by mouth See admin instructions. Except for tues and thurs - takes 2.5mg .        Follow-up Information     [facility] Follow up.   Why: [facility] will provide your home health physical and occupational therapy. They will contact you within 48 hours of your discharge home Contact information: [address] [place] [address] [phone]         Kathlee Nations, MD Follow up.   Specialty: Internal Medicine Why: Follow with PCP for repeat CBC and INR in 3 days Contact information: [id number] [place] [address] [phone]                Allergies  Allergen Reactions   Other     Pneumonia shot-redness, swelling   Sulfonamide Derivatives Other (See Comments)    Consultations: Cardiology Gastroenterology   Procedures/Studies: ECHOCARDIOGRAM COMPLETE  Result Date:  01/25/2023    ECHOCARDIOGRAM REPORT   Patient Name:   ZIAN SALZILLO Date of Exam: 01/25/2023 Medical Rec #:  [id number]         Height:       68.0 in Accession #:    [id number]        Weight:       166.9 lb Date of Birth:  [birth year]          BSA:          1.892 m Patient Age:    34 years  BP:           148/95 mmHg Patient Gender: M                 HR:           81 bpm. Exam Location:  Inpatient Procedure: 2D Echo, Cardiac Doppler and Color Doppler Indications:     Aortic valve disorder  History:         Patient has prior history of Echocardiogram examinations, most                  recent 02/19/2010. Cirrhosis, Aortic Valve Disease and s/p MV                  repair 2001, Arrythmias:Atrial Fibrillation,                  Signs/Symptoms:Fatigue; Risk Factors:Hypertension, Dyslipidemia                  and Sleep Apnea.                  Aortic Valve: St. Jude mechanical valve is present in the                  aortic position. Procedure Date: 2001.  Sonographer:     Wallie Char Referring Phys:  [id number] Imogene Burn Diagnosing Phys: Dorthula Nettles  Sonographer Comments: Image acquisition challenging due to respiratory motion. IMPRESSIONS  1. Left ventricular ejection fraction, by estimation, is 60 to 65%. The left ventricle has normal function. The left ventricle has no regional wall motion abnormalities. There is mild concentric left ventricular hypertrophy.  2. Right ventricular systolic function is mildly reduced. The right ventricular size is moderately enlarged. There is mildly elevated pulmonary artery systolic pressure. The estimated right ventricular systolic pressure is 44.8 mmHg.  3. Left atrial size was severely dilated.  4. Right atrial size was severely dilated.  5. The mitral valve has been repaired/replaced. Trivial mitral valve regurgitation. The mean mitral valve gradient is 8.0 mmHg. Moderate to severe mitral annular calcification.  6. The aortic valve has been repaired/replaced. Aortic  valve regurgitation is trivial. Mild aortic valve stenosis. There is a St. Jude mechanical valve present in the aortic position. Procedure Date: 2001. Aortic valve area, by VTI measures 2.87 cm. Aortic valve mean gradient measures 23.0 mmHg. Aortic valve Vmax measures 3.30 m/s.  7. There is mild dilatation of the ascending aorta, measuring 43 mm.  8. The inferior vena cava is dilated in size with <50% respiratory variability, suggesting right atrial pressure of 15 mmHg. FINDINGS  Left Ventricle: Left ventricular ejection fraction, by estimation, is 60 to 65%. The left ventricle has normal function. The left ventricle has no regional wall motion abnormalities. The left ventricular internal cavity size was normal in size. There is  mild concentric left ventricular hypertrophy. Left ventricular diastolic function could not be evaluated due to atrial fibrillation. Right Ventricle: The right ventricular size is moderately enlarged. No increase in right ventricular wall thickness. Right ventricular systolic function is mildly reduced. There is mildly elevated pulmonary artery systolic pressure. The tricuspid regurgitant velocity is 2.73 m/s, and with an assumed right atrial pressure of 15 mmHg, the estimated right ventricular systolic pressure is 44.8 mmHg. Left Atrium: Left atrial size was severely dilated. Right Atrium: Right atrial size was severely dilated. Pericardium: There is no evidence of pericardial effusion. Mitral Valve: The mitral valve has been repaired/replaced. Moderate to severe mitral annular  calcification. Trivial mitral valve regurgitation. MV peak gradient, 21.0 mmHg. The mean mitral valve gradient is 8.0 mmHg. Tricuspid Valve: The tricuspid valve is normal in structure. Tricuspid valve regurgitation is mild. Aortic Valve: The aortic valve has been repaired/replaced. Aortic valve regurgitation is trivial. Mild aortic stenosis is present. Aortic valve mean gradient measures 23.0 mmHg. Aortic valve  peak gradient measures 43.4 mmHg. Aortic valve area, by VTI measures 2.87 cm. There is a St. Jude mechanical valve present in the aortic position. Procedure Date: 2001. Pulmonic Valve: The pulmonic valve was normal in structure. Pulmonic valve regurgitation is trivial. Aorta: The aortic root and ascending aorta are structurally normal, with no evidence of dilitation. There is mild dilatation of the ascending aorta, measuring 43 mm. Venous: The inferior vena cava is dilated in size with less than 50% respiratory variability, suggesting right atrial pressure of 15 mmHg. IAS/Shunts: No atrial level shunt detected by color flow Doppler.  LEFT VENTRICLE PLAX 2D LVIDd:         4.30 cm     Diastology LVIDs:         3.20 cm     LV e' medial:    5.42 cm/s LV PW:         1.10 cm     LV E/e' medial:  41.0 LV IVS:        1.10 cm     LV e' lateral:   12.60 cm/s LVOT diam:     2.30 cm     LV E/e' lateral: 17.6 LV SV:         185 LV SV Index:   98 LVOT Area:     4.15 cm  LV Volumes (MOD) LV vol d, MOD A2C: 89.8 ml LV vol d, MOD A4C: 91.9 ml LV vol s, MOD A2C: 43.4 ml LV vol s, MOD A4C: 44.8 ml LV SV MOD A2C:     46.4 ml LV SV MOD A4C:     91.9 ml LV SV MOD BP:      48.2 ml RIGHT VENTRICLE            IVC RV Basal diam:  4.20 cm    IVC diam: 2.60 cm RV S prime:     9.52 cm/s TAPSE (M-mode): 1.1 cm LEFT ATRIUM             Index        RIGHT ATRIUM           Index LA diam:        5.20 cm 2.75 cm/m   RA Area:     29.50 cm LA Vol (A2C):   67.6 ml 35.72 ml/m  RA Volume:   107.00 ml 56.54 ml/m LA Vol (A4C):   74.8 ml 39.53 ml/m LA Biplane Vol: 75.3 ml 39.79 ml/m  AORTIC VALVE AV Area (Vmax):    2.85 cm AV Area (Vmean):   2.89 cm AV Area (VTI):     2.87 cm AV Vmax:           329.50 cm/s AV Vmean:          220.000 cm/s AV VTI:            0.645 m AV Peak Grad:      43.4 mmHg AV Mean Grad:      23.0 mmHg LVOT Vmax:         226.00 cm/s LVOT Vmean:        153.000 cm/s LVOT VTI:  0.446 m LVOT/AV VTI ratio: 0.69  AORTA Ao Root  diam: 3.20 cm Ao Asc diam:  4.30 cm MITRAL VALVE                TRICUSPID VALVE MV Area (PHT): 1.88 cm     TR Peak grad:   29.8 mmHg MV Area VTI:   2.83 cm     TR Vmax:        273.00 cm/s MV Peak grad:  21.0 mmHg MV Mean grad:  8.0 mmHg     SHUNTS MV Vmax:       2.29 m/s     Systemic VTI:  0.45 m MV Vmean:      126.0 cm/s   Systemic Diam: 2.30 cm MV Decel Time: 403 msec MV E velocity: 222.00 cm/s Aditya Sabharwal Electronically signed by Dorthula Nettles Signature Date/Time: 01/25/2023/1:56:13 PM    Final (Updated)    CT ANGIO GI BLEED  Result Date: 01/18/2023 CLINICAL DATA:  Left lower quadrant abdominal pain.  Bloody stools. EXAM: CTA ABDOMEN AND PELVIS WITHOUT AND WITH CONTRAST TECHNIQUE: Multidetector CT imaging of the abdomen and pelvis was performed using the standard protocol during bolus administration of intravenous contrast. Multiplanar reconstructed images and MIPs were obtained and reviewed to evaluate the vascular anatomy. RADIATION DOSE REDUCTION: This exam was performed according to the departmental dose-optimization program which includes automated exposure control, adjustment of the mA and/or kV according to patient size and/or use of iterative reconstruction technique. CONTRAST:  OMNIPAQUE IOHEXOL 350 MG/ML SOLN COMPARISON:  None Available. FINDINGS: Evaluation of this exam is limited due to respiratory motion artifact. VASCULAR Aorta: Moderate atherosclerotic calcification. No aneurysmal dilatation or dissection. No periaortic fluid collection. Celiac: The celiac trunk and its major branches are patent. SMA: The SMA is patent. Renals: The renal arteries are patent. IMA: The IMA is patent. Inflow: Mild atherosclerotic calcification of the iliac arteries. The iliac arteries are patent. No S1 dilatation or dissection. Proximal Outflow: The visualized proximal outflow is patent. Veins: The visualized iliac veins and IVC are unremarkable. The SMV, splenic vein, and main portal vein are  patent. No portal venous gas. Review of the MIP images confirms the above findings. NON-VASCULAR Lower chest: The visualized lung bases are clear. No intra-abdominal free air or free fluid. Hepatobiliary: Cirrhosis. There is mild periportal edema. Cholecystectomy. Pancreas: There is inflammatory changes of the upper abdomen surrounding the pancreas and duodenal C-loop suspicious for acute pancreatitis. Correlation with pancreatic enzymes recommended. No drainable fluid collection/abscess or pseudocyst. No dilatation of the main pancreatic duct or gland atrophy. Spleen: Normal in size without focal abnormality. Adrenals/Urinary Tract: The adrenal glands unremarkable. There is no hydronephrosis or nephrolithiasis on either side. Subcentimeter right renal hypodense lesions are too small to characterize, likely cysts. No imaging follow-up. There is symmetric enhancement of the kidneys. The visualized ureters and urinary bladder appear unremarkable. Stomach/Bowel: Inflammatory changes surrounding the duodenal C-loop likely related to acute pancreatitis. Duodenitis as the cause of the inflammatory process is less likely. There is sigmoid diverticulosis without active inflammatory changes. There is no bowel obstruction. No CT findings of active GI bleed. The appendix is normal. Lymphatic: Mildly enlarged peripancreatic lymph nodes, likely reactive. Reproductive: The prostate and seminal vesicles are grossly unremarkable. Other: None Musculoskeletal: Degenerative changes of the spine. No acute osseous pathology. IMPRESSION: 1. No CT findings of active GI bleed. 2. Findings suspicious for acute pancreatitis. Correlation with pancreatic enzymes recommended. 3. Cirrhosis. 4. Sigmoid diverticulosis. No bowel obstruction. Normal appendix. Electronically  Signed   By: Elgie Collard M.D.   On: 01/18/2023 22:14   CT Head Wo Contrast  Result Date: 01/18/2023 CLINICAL DATA:  Head and neck trauma EXAM: CT HEAD WITHOUT CONTRAST  CT CERVICAL SPINE WITHOUT CONTRAST TECHNIQUE: Multidetector CT imaging of the head and cervical spine was performed following the standard protocol without intravenous contrast. Multiplanar CT image reconstructions of the cervical spine were also generated. RADIATION DOSE REDUCTION: This exam was performed according to the departmental dose-optimization program which includes automated exposure control, adjustment of the mA and/or kV according to patient size and/or use of iterative reconstruction technique. COMPARISON:  None Available. FINDINGS: CT HEAD FINDINGS Brain: No acute territorial infarction, hemorrhage or intracranial mass. Moderate white matter hypodensity consistent with chronic small vessel ischemic change. Nonenlarged ventricles Vascular: No hyperdense vessels. Vertebral and carotid vascular calcification Skull: Normal. Negative for fracture or focal lesion. Sinuses/Orbits: Opacified left maxillary sinus with scattered calcifications Other: None CT CERVICAL SPINE FINDINGS Alignment: Reversal of cervical lordosis. 3 mm anterolisthesis C3 on C4 and C4 on C5. This is likely due to advanced posterior facet degenerative changes. Facet alignment within normal limits. Skull base and vertebrae: No acute fracture. No primary bone lesion or focal pathologic process. Soft tissues and spinal canal: No prevertebral fluid or swelling. No visible canal hematoma. Disc levels: Multilevel degenerative changes. Partial ankylosis C5-C6. Moderate disc space narrowing C4-C5, advanced disc space narrowing C6-C7 and C7-T1. Hypertrophic facet degenerative changes at multiple levels with foraminal stenosis. Upper chest: Negative. Other: None IMPRESSION: 1. No CT evidence for acute intracranial abnormality. 2. Moderate chronic small vessel ischemic changes of the white matter. 3. Reversal of cervical lordosis and multilevel degenerative changes. No acute fracture Electronically Signed   By: Jasmine Pang M.D.   On: 01/18/2023  19:45   CT Cervical Spine Wo Contrast  Result Date: 01/18/2023 CLINICAL DATA:  Head and neck trauma EXAM: CT HEAD WITHOUT CONTRAST CT CERVICAL SPINE WITHOUT CONTRAST TECHNIQUE: Multidetector CT imaging of the head and cervical spine was performed following the standard protocol without intravenous contrast. Multiplanar CT image reconstructions of the cervical spine were also generated. RADIATION DOSE REDUCTION: This exam was performed according to the departmental dose-optimization program which includes automated exposure control, adjustment of the mA and/or kV according to patient size and/or use of iterative reconstruction technique. COMPARISON:  None Available. FINDINGS: CT HEAD FINDINGS Brain: No acute territorial infarction, hemorrhage or intracranial mass. Moderate white matter hypodensity consistent with chronic small vessel ischemic change. Nonenlarged ventricles Vascular: No hyperdense vessels. Vertebral and carotid vascular calcification Skull: Normal. Negative for fracture or focal lesion. Sinuses/Orbits: Opacified left maxillary sinus with scattered calcifications Other: None CT CERVICAL SPINE FINDINGS Alignment: Reversal of cervical lordosis. 3 mm anterolisthesis C3 on C4 and C4 on C5. This is likely due to advanced posterior facet degenerative changes. Facet alignment within normal limits. Skull base and vertebrae: No acute fracture. No primary bone lesion or focal pathologic process. Soft tissues and spinal canal: No prevertebral fluid or swelling. No visible canal hematoma. Disc levels: Multilevel degenerative changes. Partial ankylosis C5-C6. Moderate disc space narrowing C4-C5, advanced disc space narrowing C6-C7 and C7-T1. Hypertrophic facet degenerative changes at multiple levels with foraminal stenosis. Upper chest: Negative. Other: None IMPRESSION: 1. No CT evidence for acute intracranial abnormality. 2. Moderate chronic small vessel ischemic changes of the white matter. 3. Reversal of  cervical lordosis and multilevel degenerative changes. No acute fracture Electronically Signed   By: Jasmine Pang M.D.   On: 01/18/2023  19:45   (Echo, Carotid, EGD, Colonoscopy, ERCP)    Subjective:  No significant events overnight, he denies any complaints today Discharge Exam: Vitals:   01/25/23 0813 01/25/23 1221  BP: (!) 148/95 115/83  Pulse: 94 74  Resp: 19 19  Temp: 97.9 F (36.6 C) 98 F (36.7 C)  SpO2: 97% 100%   Vitals:   01/24/23 2210 01/25/23 0551 01/25/23 0813 01/25/23 1221  BP: 104/69 120/71 (!) 148/95 115/83  Pulse: 77 89 94 74  Resp: 16 19 19 19   Temp: 97.8 F (36.6 C) 97.8 F (36.6 C) 97.9 F (36.6 C) 98 F (36.7 C)  TempSrc: Axillary Oral Oral Oral  SpO2: 96% 96% 97% 100%  Weight:      Height:        General: Pt is alert, awake, not in acute distress Cardiovascular: RRR, S1/S2 +, no rubs, no gallops, mechanical click present Respiratory: CTA bilaterally, no wheezing, no rhonchi Abdominal: Soft, NT, ND, bowel sounds + Extremities: no edema, no cyanosis    The results of significant diagnostics from this hospitalization (including imaging, microbiology, ancillary and laboratory) are listed below for reference.     Microbiology: No results found for this or any previous visit (from the past 240 hour(s)).   Labs: BNP (last 3 results) No results for input(s): "BNP" in the last 8760 hours. Basic Metabolic Panel: Recent Labs  Lab 01/21/23 0538 01/22/23 0549 01/23/23 0456 01/24/23 0700 01/25/23 0333  NA 139 137 137 139 138  K 3.7 3.5 3.8 3.5 4.1  CL 100 103 101 108 103  CO2 28 24 22 25 24   GLUCOSE 100* 95 101* 90 95  BUN 10 10 6* 5* 11  CREATININE 0.85 0.75 0.76 0.74 0.85  CALCIUM 9.1 8.6* 9.2 8.7* 8.9   Liver Function Tests: Recent Labs  Lab 01/18/23 1845 01/19/23 0730  AST 22 22  ALT 13 14  ALKPHOS 50 46  BILITOT 0.2* 0.9  PROT 5.9* 5.6*  ALBUMIN 3.4* 3.3*   Recent Labs  Lab 01/18/23 1845 01/19/23 0614  LIPASE 54* 44    Recent Labs  Lab 01/19/23 0730  AMMONIA 17   CBC: Recent Labs  Lab 01/18/23 1845 01/18/23 1851 01/22/23 0549 01/22/23 1722 01/23/23 0456 01/24/23 0659 01/24/23 2331 01/25/23 0333  WBC 7.2   < > 5.2 4.9 4.9 4.5  --  5.5  NEUTROABS 4.7  --   --   --   --   --   --   --   HGB 4.9*   < > 8.0* 8.3* 8.9* 7.9* 8.9* 8.7*  HCT 15.5*   < > 24.7* 26.2* 27.8* 24.8* 28.2* 27.7*  MCV 86.6   < > 87.9 86.2 85.5 87.3  --  87.4  PLT 198   < > 190 198 232 210  --  195   < > = values in this interval not displayed.   Cardiac Enzymes: No results for input(s): "CKTOTAL", "CKMB", "CKMBINDEX", "TROPONINI" in the last 168 hours. BNP: Invalid input(s): "POCBNP" CBG: No results for input(s): "GLUCAP" in the last 168 hours. D-Dimer No results for input(s): "DDIMER" in the last 72 hours. Hgb A1c No results for input(s): "HGBA1C" in the last 72 hours. Lipid Profile Recent Labs    01/25/23 0333  TRIG 40   Thyroid function studies Recent Labs    01/24/23 1629  TSH 2.202   Anemia work up Recent Labs    01/24/23 1629  VITAMINB12 750  TIBC 342  IRON 5*  Urinalysis    Component Value Date/Time   COLORURINE YELLOW 01/18/2023 2052   APPEARANCEUR CLEAR 01/18/2023 2052   LABSPEC 1.015 01/18/2023 2052   PHURINE 5.0 01/18/2023 2052   GLUCOSEU NEGATIVE 01/18/2023 2052   HGBUR NEGATIVE 01/18/2023 2052   BILIRUBINUR NEGATIVE 01/18/2023 2052   KETONESUR NEGATIVE 01/18/2023 2052   PROTEINUR NEGATIVE 01/18/2023 2052   NITRITE NEGATIVE 01/18/2023 2052   LEUKOCYTESUR NEGATIVE 01/18/2023 2052   Sepsis Labs Recent Labs  Lab 01/22/23 1722 01/23/23 0456 01/24/23 0659 01/25/23 0333  WBC 4.9 4.9 4.5 5.5   Microbiology No results found for this or any previous visit (from the past 240 hour(s)).   Time coordinating discharge: Over 30 minutes  SIGNED:   Huey Bienenstock, MD  [facility] 01/25/2023, 2:11 PM Pager   If 7PM-7AM, please contact  night-coverage [email or web address]

## 2023-01-25 NOTE — Progress Notes (Signed)
ANTICOAGULATION CONSULT NOTE   Pharmacy Consult for warfarin/enoxaparin Indication: atrial fibrillation/mechanical valve  Allergies  Allergen Reactions   Other     Pneumonia shot-redness, swelling   Sulfonamide Derivatives Other (See Comments)    Patient Measurements: Height: 5\' 8"  (172.7 cm) Weight: 75.7 kg (166 lb 14.2 oz) IBW/kg (Calculated) : 68.4  Vital Signs: Temp: 97.9 F (36.6 C) (07/30 0813) Temp Source: Oral (07/30 0813) BP: 148/95 (07/30 0813) Pulse Rate: 94 (07/30 0813)  Labs: Recent Labs    01/22/23 1722 01/23/23 0456 01/24/23 0659 01/24/23 0700 01/24/23 0709 01/24/23 2331 01/25/23 0333  HGB 8.3* 8.9* 7.9*  --   --  8.9* 8.7*  HCT 26.2* 27.8* 24.8*  --   --  28.2* 27.7*  PLT 198 232 210  --   --   --  195  LABPROT  --  16.7*  --  18.2*  --   --  18.6*  INR  --  1.3*  --  1.5*  --   --  1.5*  HEPARINUNFRC 0.24*  --   --   --  0.31  --  0.47  CREATININE  --  0.76  --  0.74  --   --  0.85    Estimated Creatinine Clearance: 74.9 mL/min (by C-G formula based on SCr of 0.85 mg/dL).   Medical History: Past Medical History:  Diagnosis Date   Anemia    Depression    Gastritis    GERD (gastroesophageal reflux disease)    Gout    H/O aortic valve replacement    Hemorrhoids    Hiatal hernia    Internal hemorrhoids    Irritable bowel syndrome    Mitral insufficiency    Neck pain    Septic shock (HCC) 03/06/2009   Sleep apnea    Tubular adenoma     Medications:  Scheduled:   allopurinol  300 mg Oral Daily   carvedilol  6.25 mg Oral BID WC   DULoxetine  30 mg Oral Daily   fluticasone  2 spray Each Nare Daily   loratadine  10 mg Oral Daily   multivitamin with minerals  1 tablet Oral Daily   pantoprazole  40 mg Oral Daily   traZODone  100 mg Oral QHS   Warfarin - Pharmacist Dosing Inpatient   Does not apply q1600   Infusions:   heparin 1,300 Units/hr (01/25/23 0723)   lactated ringers 20 mL/hr at 01/23/23 1027   PRN: acetaminophen **OR**  acetaminophen, albuterol, cyclobenzaprine, HYDROcodone-acetaminophen, HYDROmorphone (DILAUDID) injection, ondansetron **OR** ondansetron (ZOFRAN) IV  Assessment: KD, is a 72 YO M, presenting with an upper GI bleed with a PMH of a mechanical AVR, mitral valve repair / annuloplasty in 2001, A.Fib (on warfarin), and HTN. Warfarin PTA dose 2.5 mg TuTh; 5mg  all other days. Patient's last warfarin dose was on 7/22 and anticoagulation therapy has been held until patient's INR < 2. Pts current INR is 1.6 (7/27). Pts hgb and plts are stable and improved since admission with no additional S/sx of bleeding. Heparin was started on 7/27 and held for colonoscopy and endoscopy procedure on 01/23/23. On 01/25/23, Heparin switched to enoxaparin.   INR 1.5. Level subtherapeutic. CBC stable. Renal function stable   Goal of Therapy:  INR 2.5-3.5  Monitor platelets by anticoagulation protocol: Yes   Plan:  Warfarin 7.5 mg x 1 dose Discontinue Heparin. Initiate Enoxaparin 1mg /kg [place] Q12H Check INR daily  Continue to monitor H&H and platelets  Verdene Rio, PharmD PGY1 Pharmacy Resident

## 2023-01-25 NOTE — Discharge Instructions (Signed)
Follow with Primary MD Kathlee Nations, MD in 7 days   Get CBC, CMP,  checked  by Primary MD next visit.    Activity: As tolerated with Full fall precautions use walker/cane & assistance as needed   Disposition Home    Diet: Heart Healthy    On your next visit with your primary care physician please Get Medicines reviewed and adjusted.   Please request your Prim.MD to go over all Hospital Tests and Procedure/Radiological results at the follow up, please get all Hospital records sent to your Prim MD by signing hospital release before you go home.   If you experience worsening of your admission symptoms, develop shortness of breath, life threatening emergency, suicidal or homicidal thoughts you must seek medical attention immediately by calling 911 or calling your MD immediately  if symptoms less severe.  You Must read complete instructions/literature along with all the possible adverse reactions/side effects for all the Medicines you take and that have been prescribed to you. Take any new Medicines after you have completely understood and accpet all the possible adverse reactions/side effects.   Do not drive, operating heavy machinery, perform activities at heights, swimming or participation in water activities or provide baby sitting services if your were admitted for syncope or siezures until you have seen by Primary MD or a Neurologist and advised to do so again.  Do not drive when taking Pain medications.    Do not take more than prescribed Pain, Sleep and Anxiety Medications  Special Instructions: If you have smoked or chewed Tobacco  in the last 2 yrs please stop smoking, stop any regular Alcohol  and or any Recreational drug use.  Wear Seat belts while driving.   Please note  You were cared for by a hospitalist during your hospital stay. If you have any questions about your discharge medications or the care you received while you were in the hospital after you are discharged,  you can call the unit and asked to speak with the hospitalist on call if the hospitalist that took care of you is not available. Once you are discharged, your primary care physician will handle any further medical issues. Please note that NO REFILLS for any discharge medications will be authorized once you are discharged, as it is imperative that you return to your primary care physician (or establish a relationship with a primary care physician if you do not have one) for your aftercare needs so that they can reassess your need for medications and monitor your lab values.

## 2023-01-25 NOTE — Progress Notes (Signed)
    Subjective:  Chronic right shoulder pain    Objective:  Vitals:   01/24/23 1829 01/24/23 1945 01/24/23 2210 01/25/23 0551  BP: 113/67 113/82 104/69 120/71  Pulse: 88 83 77 89  Resp: 14 18 16 19   Temp: 98.4 F (36.9 C) 98 F (36.7 C) 97.8 F (36.6 C) 97.8 F (36.6 C)  TempSrc: Oral Axillary Axillary Oral  SpO2: 95% 97% 96% 96%  Weight:      Height:        Intake/Output from previous day:  Intake/Output Summary (Last 24 hours) at 01/25/2023 0743 Last data filed at 01/25/2023 0723 Gross per 24 hour  Intake 1085.14 ml  Output --  Net 1085.14 ml    Physical Exam:  Pale white male Lungs clear Mechanical AVR with normal clicks and no AR Abdomen benign Lungs clear No edema  Lab Results: Basic Metabolic Panel: Recent Labs    01/24/23 0700 01/25/23 0333  NA 139 138  K 3.5 4.1  CL 108 103  CO2 25 24  GLUCOSE 90 95  BUN 5* 11  CREATININE 0.74 0.85  CALCIUM 8.7* 8.9   Liver Function Tests: No results for input(s): "AST", "ALT", "ALKPHOS", "BILITOT", "PROT", "ALBUMIN" in the last 72 hours. No results for input(s): "LIPASE", "AMYLASE" in the last 72 hours. CBC: Recent Labs    01/24/23 0659 01/24/23 2331 01/25/23 0333  WBC 4.5  --  5.5  HGB 7.9* 8.9* 8.7*  HCT 24.8* 28.2* 27.7*  MCV 87.3  --  87.4  PLT 210  --  195   Cardiac Enzymes: No results for input(s): "CKTOTAL", "CKMB", "CKMBINDEX", "TROPONINI" in the last 72 hours. BNP: Invalid input(s): "POCBNP" D-Dimer: No results for input(s): "DDIMER" in the last 72 hours. Hemoglobin A1C: No results for input(s): "HGBA1C" in the last 72 hours. Fasting Lipid Panel: Recent Labs    01/25/23 0333  TRIG 40   Thyroid Function Tests: Recent Labs    01/24/23 1629  TSH 2.202   Anemia Panel: Recent Labs    01/24/23 1629  VITAMINB12 750  TIBC 342  IRON 5*    Imaging: Imaging results have been reviewed  Cardiac Studies:  ECG:    Echo:   Medications:    allopurinol  300 mg Oral Daily    carvedilol  6.25 mg Oral BID WC   DULoxetine  30 mg Oral Daily   fluticasone  2 spray Each Nare Daily   loratadine  10 mg Oral Daily   multivitamin with minerals  1 tablet Oral Daily   pantoprazole  40 mg Oral Daily   traZODone  100 mg Oral QHS   Warfarin - Pharmacist Dosing Inpatient   Does not apply q1600      heparin 1,300 Units/hr (01/25/23 0723)   lactated ringers 20 mL/hr at 01/23/23 1027    Assessment/Plan:   Anticoagulation:  GI bleed post endo with polyps in stomach and colon back on coumadin INR 1.5 continue heparin until INR 2.2 He has used lovenox shots at home before. Normal coumadin dose 5 mg daily and 2.5 mg Thurs/Sunday. His primary follows his INR q 4 weeks  AVR  normal valve sounds he follows with cardiology in [place] TTE pending post endo have also written for ECG  Anemia:  Hb improved post transfusion one unit yesterday 8.9  Bleeding source presumed polyps  Shoulder:  right needs MRI and f/u ortho   Charlton Haws 01/25/2023, 7:43 AM

## 2023-01-27 ENCOUNTER — Encounter (HOSPITAL_COMMUNITY): Payer: Self-pay | Admitting: Gastroenterology

## 2023-02-04 ENCOUNTER — Other Ambulatory Visit (HOSPITAL_COMMUNITY): Payer: Self-pay

## 2023-02-15 ENCOUNTER — Encounter: Payer: Self-pay | Admitting: Internal Medicine

## 2023-02-15 ENCOUNTER — Other Ambulatory Visit: Payer: Self-pay | Admitting: *Deleted

## 2023-02-15 ENCOUNTER — Ambulatory Visit (INDEPENDENT_AMBULATORY_CARE_PROVIDER_SITE_OTHER): Payer: Medicare Other | Admitting: Internal Medicine

## 2023-02-15 VITALS — BP 112/63 | HR 86 | Temp 98.0°F | Ht 68.0 in | Wt 159.6 lb

## 2023-02-15 DIAGNOSIS — K922 Gastrointestinal hemorrhage, unspecified: Secondary | ICD-10-CM | POA: Diagnosis not present

## 2023-02-15 DIAGNOSIS — R1310 Dysphagia, unspecified: Secondary | ICD-10-CM

## 2023-02-15 MED ORDER — LACTULOSE 10 GM/15ML PO SOLN: 10.0000 g | Freq: Every day | ORAL | 11 refills | Status: DC

## 2023-02-15 NOTE — Patient Instructions (Signed)
It was good to see you again today!  As discussed, cirrhosis or scarring in the liver per probably secondary to fatty liver or congestive congestion from mild heart failure  You are having trouble swallowing and constipated.  All these issues will be evaluated  Repeat liver ultrasound in 6 months to screen for hepatoma  Barium pill esophagram to further evaluate dysphagia  Lactulose syrup 15 cc daily dispense 1 plate with 11 refills.  Stop Metamucil  You need a video capsule study of your small intestine but the swallowing issue needs to be further evaluated  Office visit in 3 months.  Further recommendations to follow.

## 2023-02-15 NOTE — Progress Notes (Unsigned)
Primary Care Physician:  Majel Homer, MD Primary Gastroenterologist:  Dr.   Pre-Procedure History & Physical: HPI:  Lucas Bowman is a 72 y.o. male here for hospital follow-up.  Has not been seen here in a good 4 years.  He presented to [facility] via EMS with lightheadedness and fatigue.  Found to have Hemoccult positive stool and hemoglobin of 4.9 ultimately, he is given 5 units of blood while he was in the hospital.  He underwent a EGD and colonoscopy by Dr. Elnoria Howard which revealed some gastric and small colonic polyps.  CTA negative for bleeding thickening around the pancreas; lipase normal.  IgG4 level normal.   Clinically, no abdominal pain to go with pancreatitis.Cirrhotic appearing liver no evidence of portal hypertension or esophageal varices.  Anemia and bleeding attributed to his polyps.  Chronically anticoagulated due to atrial fibrillation and aortic valve replacement.  Long history of colonic polyps dealt with here in the past.  Reports esophageal dysphagia.  He has had a history of mini strokes.  States he is confused from time to time.  He is constipated from time to time takes Metamucil as needed.  Of note, patient was had an echo when he was in the hospital recently he had a thickened dilated right ventricle.  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  Takes opioids for chronic neck and shoulder pain.  Past Medical History:  Diagnosis Date   Anemia    Depression    Gastritis    GERD (gastroesophageal reflux disease)    Gout    H/O aortic valve replacement    Hemorrhoids    Hiatal hernia    Internal hemorrhoids    Irritable bowel syndrome    Mitral insufficiency    Neck pain    Septic shock (HCC) 03/06/2009   Sleep apnea    Tubular adenoma     Past Surgical History:  Procedure Laterality Date   AORTIC VALVE REPLACEMENT AND MITRAL VALVE REPAIR  01/08/2000   BIOPSY N/A 04/04/2014   Procedure: GASTRIC BIOPSIES;  Surgeon: Corbin Ade, MD;   Location: [facility];  Service: Endoscopy;  Laterality: N/A;   CARDIAC VALVE REPLACEMENT  01/08/2000   AVR and Mitral Valve Repair   COLONOSCOPY  01/28/2009   RMR: Internal hemorrhoids, otherwise normal rectum/Sigmoid polyp, status post hot snare polypectomy.ADENOMATOUS POLYP    COLONOSCOPY  02/17/2006   Dr.Margret Moat-Normal rectum.  Left colon polyps= tubular adenoma,Remainder of the colonic mucosa appeared normal.    COLONOSCOPY WITH PROPOFOL N/A 04/04/2014   Procedure: COLONOSCOPY WITH PROPOFOL;  Surgeon: Corbin Ade, MD;  Location: [facility];  Service: Endoscopy;  Laterality: N/A;  1018 in cecum, total withdrawal time:   COLONOSCOPY WITH PROPOFOL N/A 01/23/2023   Procedure: COLONOSCOPY WITH PROPOFOL;  Surgeon: Jeani Hawking, MD;  Location[facility] ENDOSCOPY;  Service: Gastroenterology;  Laterality: N/A;   CORONARY ANGIOPLASTY     ESOPHAGOGASTRODUODENOSCOPY  01/28/2009   [facility]:NFAO distal esophageal erosions consistent with mild erosive reflux esophagitis/ Small hiatal hernia otherwise.  Upper GI tract appeared normal.   ESOPHAGOGASTRODUODENOSCOPY  02/17/2006   Dr.Mckynna Vanloan-Normal esophagus, small hiatal hernia, otherwise normal stomach, D1 and D2   ESOPHAGOGASTRODUODENOSCOPY (EGD) WITH PROPOFOL N/A 04/04/2014   Procedure: ESOPHAGOGASTRODUODENOSCOPY (EGD) WITH PROPOFOL;  Surgeon: Corbin Ade, MD;  Location: [facility];  Service: Endoscopy;  Laterality: N/A;   ESOPHAGOGASTRODUODENOSCOPY (EGD) WITH PROPOFOL N/A 01/23/2023   Procedure: ESOPHAGOGASTRODUODENOSCOPY (EGD) WITH PROPOFOL;  Surgeon: Jeani Hawking, MD;  Location: [facility] ENDOSCOPY;  Service: Gastroenterology;  Laterality: N/A;   GALLBLADDER SURGERY     MALONEY DILATION N/A 04/04/2014   Procedure: Elease Hashimoto DILATION;  Surgeon: Corbin Ade, MD;  Location: [facility];  Service: Endoscopy;  Laterality: N/A;  #56 maloney dilator   NECK SURGERY     x2   POLYPECTOMY N/A 04/04/2014   Procedure: POLYPECTOMY;  Surgeon: Corbin Ade, MD;  Location: [facility];   Service: Endoscopy;  Laterality: N/A;   POLYPECTOMY  01/23/2023   Procedure: POLYPECTOMY;  Surgeon: Jeani Hawking, MD;  Location: [facility] ENDOSCOPY;  Service: Gastroenterology;;    Prior to Admission medications   Medication Sig Start Date End Date Taking? Authorizing Provider  albuterol (VENTOLIN HFA) 108 (90 Base) MCG/ACT inhaler Inhale 1-2 puffs into the lungs 2 (two) times daily as needed for wheezing or shortness of breath. 04/24/19   [provider]  allopurinol (ZYLOPRIM) 300 MG tablet Take 300 mg by mouth daily.  01/28/14   [provider]  carvedilol (COREG) 6.25 MG tablet Take 6.25 mg by mouth 2 (two) times daily with a meal. 02/12/14   [provider]  Cholecalciferol (VITAMIN D) 50 MCG (2000 UT) CAPS Take 1 capsule by mouth daily.    [provider]  cyclobenzaprine (FLEXERIL) 10 MG tablet Take 10 mg by mouth at bedtime as needed for muscle spasms. 04/10/19   [provider]  dicyclomine (BENTYL) 10 MG capsule Take 10 mg by mouth daily as needed for spasms.    [provider]  DULoxetine (CYMBALTA) 30 MG capsule Take 30 mg by mouth daily.    [provider]  enoxaparin (LOVENOX) 80 MG/0.8ML injection Inject 0.75 mLs (75 mg total) into the skin every 12 (twelve) hours for 14 doses. Please stop taking Lovenox once your INR is 2 or more 01/25/23 02/01/23  Elgergawy, Leana Roe, MD  fluticasone (FLONASE) 50 MCG/ACT nasal spray Place 2 sprays into both nostrils daily.    [provider]  furosemide (LASIX) 20 MG tablet Take 20 mg by mouth daily. 02/12/14   [provider]  gemfibrozil (LOPID) 600 MG tablet Take 600 mg by mouth daily.  02/04/14   [provider]  HYDROcodone-acetaminophen (NORCO) 10-325 MG per tablet Take 1 tablet by mouth every 6 (six) hours as needed for moderate pain.  02/18/14   [provider]  loratadine (CLARITIN) 10 MG tablet Take 10 mg by mouth daily.    [provider]   Multiple Vitamin (MULTIVITAMIN) tablet Take 1 tablet by mouth daily.    [provider]  naloxone [facility]) nasal spray 4 mg/0.1 mL Place 1 spray into the nose as needed.    [provider]  nystatin cream (MYCOSTATIN) Apply 1 application  topically 2 (two) times daily as needed for dry skin.    [provider]  ondansetron (ZOFRAN-ODT) 4 MG disintegrating tablet Take 4 mg by mouth every 8 (eight) hours as needed for nausea or vomiting.  02/05/14   [provider]  pantoprazole (PROTONIX) 40 MG tablet Take 40 mg by mouth daily.  01/28/14   [provider]  traZODone (DESYREL) 100 MG tablet Take 100 mg by mouth at bedtime. 04/21/19   [provider]  warfarin (COUMADIN) 5 MG tablet Take 5 mg by mouth See admin instructions. Except for tues and thurs - takes 2.5mg . 12/17/13   [provider]    Allergies as of 02/15/2023 - Review Complete 02/15/2023  Allergen Reaction Noted   Other  03/08/2019   Pneumococcal 13-val  conj vacc Swelling 04/25/2015   Sulfonamide derivatives Other (See Comments)     No family history on file.  Social History   Socioeconomic History   Marital status: Married    Spouse name: Not on file   Number of children: Not on file   Years of education: Not on file   Highest education level: Not on file  Occupational History   Not on file  Tobacco Use   Smoking status: Never   Smokeless tobacco: Never  Substance and Sexual Activity   Alcohol use: No   Drug use: No   Sexual activity: Not on file  Other Topics Concern   Not on file  Social History Narrative   Not on file   Social Determinants of Health   Financial Resource Strain: Not on file  Food Insecurity: Not on file  Transportation Needs: Not on file  Physical Activity: Not on file  Stress: Not on file  Social Connections: Not on file  Intimate Partner Violence: Not on file    Review of Systems: See HPI, otherwise negative ROS  Physical  Exam: BP 112/63 (BP Location: Right Arm, Patient Position: Sitting, Cuff Size: Normal)   Pulse 86   Temp 98 F (36.7 C) (Oral)   Ht 5\' 8"  (1.727 m)   Wt 159 lb 9.6 oz (72.4 kg)   SpO2 97%   BMI 24.27 kg/m  General:   Alert, appears in no acute distress.  Slow to find words.  Accompanied by his wife.   Skin: No jaundice or cutaneous stigmata of chronic liver disease.. Mouth:  No deformity or lesions. Abdomen: Non-distended, normal bowel sounds.  Soft and nontender without appreciable mass or hepatosplenomegaly.  Pulses:  Normal pulses noted. Extremities:  Without clubbing or edema.  Impression/Plan: 72 year old gentleman well-known to me from past encounters  -lost to follow-up for the past 4 years -now returns after hospitalization down in [place] as outlined above.  Significant GI bleed with a fairly unimpressive EGD and colonoscopy.  Fortunately no stigmata of chronic portal hypertension.  He does have a cirrhotic appearing liver on CT viral markers, autoimmune markers negative.  Has iron deficiency.  No family history of liver disease.  Certainly no alcohol exposure.   abnormal pancreas on CTA of uncertain significance.  Lipase not elevated clinical presentation not consistent with pancreatitis.  Likely etiology could be fatty plus minus congestion from chronic right heart failure.  Intermittently confused and constipated.  I do not believe we can reconcile degree of GI blood loss from findings of both EGD and colonoscopy even in the setting of anticoagulation.  I believe he would benefit from having his small bowel imaged at some point.  Dysphagia needs further evaluation initially.  Recommendations: As discussed, cirrhosis or scarring in the liver per probably secondary to fatty liver or congestive congestion from mild  right heart failure  You are having trouble swallowing and constipated.  All these issues will be evaluated  Repeat liver ultrasound in 6 months to screen for  hepatoma  Barium pill esophagram to further evaluate dysphagia  Lactulose syrup 15 cc daily dispense 1 plate with 11 refills.  Stop Metamucil  Video capsule study of  small intestine but swallowing issue needs to be further evaluated.  Will consider repeat imaging of the pancreas at a later date.  Office visit in 3 months.  Further recommendations to follow.     Notice: This dictation was prepared with Dragon dictation along with smaller phrase technology. Any transcriptional  errors that result from this process are unintentional and may not be corrected upon review.

## 2023-02-16 ENCOUNTER — Telehealth: Payer: Self-pay | Admitting: *Deleted

## 2023-02-16 ENCOUNTER — Encounter: Payer: Self-pay | Admitting: *Deleted

## 2023-02-16 NOTE — Telephone Encounter (Signed)
Pt informed of [facility] appt date and time. Will send message via MyChart per pt's request.

## 2023-02-21 ENCOUNTER — Ambulatory Visit (HOSPITAL_COMMUNITY)
Admission: RE | Admit: 2023-02-21 | Discharge: 2023-02-21 | Disposition: A | Discharge status: Home / Self Care | Payer: Medicare Other | Source: Ambulatory Visit | Attending: Internal Medicine | Admitting: Internal Medicine

## 2023-02-21 DIAGNOSIS — R1310 Dysphagia, unspecified: Secondary | ICD-10-CM | POA: Insufficient documentation

## 2023-04-08 ENCOUNTER — Encounter: Payer: Self-pay | Admitting: Internal Medicine

## 2023-05-10 ENCOUNTER — Ambulatory Visit (INDEPENDENT_AMBULATORY_CARE_PROVIDER_SITE_OTHER): Payer: Medicare Other | Admitting: Internal Medicine

## 2023-05-10 ENCOUNTER — Encounter: Payer: Self-pay | Admitting: Internal Medicine

## 2023-05-10 VITALS — BP 123/77 | HR 76 | Temp 98.0°F | Ht 68.0 in | Wt 179.2 lb

## 2023-05-10 DIAGNOSIS — R7989 Other specified abnormal findings of blood chemistry: Secondary | ICD-10-CM | POA: Diagnosis not present

## 2023-05-10 DIAGNOSIS — R1310 Dysphagia, unspecified: Secondary | ICD-10-CM | POA: Diagnosis not present

## 2023-05-10 DIAGNOSIS — K922 Gastrointestinal hemorrhage, unspecified: Secondary | ICD-10-CM

## 2023-05-10 NOTE — Progress Notes (Signed)
Primary Care Physician:  Majel Homer, MD Primary Gastroenterologist:  Dr. Jena Gauss  Pre-Procedure History & Physical: HPI:  Lucas Bowman is a 72 y.o. male here for follow-up of torrential GI bleed earlier this year in [place].  Small gastric polyps and negative colonoscopy found; presenting hemoglobin 4.9.  5 units transfused. He remains on Coumadin for aortic valve graft.  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  He was to have speech /swallowing evaluation due oropharyngeal symptoms.  He had a barium pill esophagram  -  had difficulty swallowing the pill but no esophageal obstruction  - small Zenker's.  He has not had any labs since August of this year at which time his hemoglobin is 9.3 Clinically, no further bleeding, constipation well-managed on lactulose daily.  Having 2-3 bowel movements daily.  Past Medical History:  Diagnosis Date   Anemia    Depression    Gastritis    GERD (gastroesophageal reflux disease)    Gout    H/O aortic valve replacement    Hemorrhoids    Hiatal hernia    Internal hemorrhoids    Irritable bowel syndrome    Mitral insufficiency    Neck pain    Septic shock (HCC) 03/06/2009   Sleep apnea    Tubular adenoma     Past Surgical History:  Procedure Laterality Date   AORTIC VALVE REPLACEMENT AND MITRAL VALVE REPAIR  01/08/2000   BIOPSY N/A 04/04/2014   Procedure: GASTRIC BIOPSIES;  Surgeon: Corbin Ade, MD;  Location: [facility];  Service: Endoscopy;  Laterality: N/A;   CARDIAC VALVE REPLACEMENT  01/08/2000   AVR and Mitral Valve Repair   COLONOSCOPY  01/28/2009   RMR: Internal hemorrhoids, otherwise normal rectum/Sigmoid polyp, status post hot snare polypectomy.ADENOMATOUS POLYP    COLONOSCOPY  02/17/2006   Dr.Elohim Brune-Normal rectum.  Left colon polyps= tubular adenoma,Remainder of the colonic mucosa appeared normal.    COLONOSCOPY WITH PROPOFOL N/A 04/04/2014   Procedure: COLONOSCOPY WITH PROPOFOL;   Surgeon: Corbin Ade, MD;  Location: [facility];  Service: Endoscopy;  Laterality: N/A;  1018 in cecum, total withdrawal time:   COLONOSCOPY WITH PROPOFOL N/A 01/23/2023   Procedure: COLONOSCOPY WITH PROPOFOL;  Surgeon: Jeani Hawking, MD;  Location: [facility] ENDOSCOPY;  Service: Gastroenterology;  Laterality: N/A;   CORONARY ANGIOPLASTY     ESOPHAGOGASTRODUODENOSCOPY  01/28/2009   VHQ:IONG distal esophageal erosions consistent with mild erosive reflux esophagitis/ Small hiatal hernia otherwise.  Upper GI tract appeared normal.   ESOPHAGOGASTRODUODENOSCOPY  02/17/2006   Dr.Juaquin Ludington-Normal esophagus, small hiatal hernia, otherwise normal stomach, D1 and D2   ESOPHAGOGASTRODUODENOSCOPY (EGD) WITH PROPOFOL N/A 04/04/2014   Procedure: ESOPHAGOGASTRODUODENOSCOPY (EGD) WITH PROPOFOL;  Surgeon: Corbin Ade, MD;  Location: [facility];  Service: Endoscopy;  Laterality: N/A;   ESOPHAGOGASTRODUODENOSCOPY (EGD) WITH PROPOFOL N/A 01/23/2023   Procedure: ESOPHAGOGASTRODUODENOSCOPY (EGD) WITH PROPOFOL;  Surgeon: Jeani Hawking, MD;  Location: [facility] ENDOSCOPY;  Service: Gastroenterology;  Laterality: N/A;   GALLBLADDER SURGERY     MALONEY DILATION N/A 04/04/2014   Procedure: Elease Hashimoto DILATION;  Surgeon: Corbin Ade, MD;  Location: [facility];  Service: Endoscopy;  Laterality: N/A;  #56 maloney dilator   NECK SURGERY     x2   POLYPECTOMY N/A 04/04/2014   Procedure: POLYPECTOMY;  Surgeon: Corbin Ade, MD;  Location: [facility];  Service: Endoscopy;  Laterality: N/A;   POLYPECTOMY  01/23/2023   Procedure: POLYPECTOMY;  Surgeon: Jeani Hawking, MD;  Location: [facility] ENDOSCOPY;  Service:  Gastroenterology;;    Prior to Admission medications   Medication Sig Start Date End Date Taking? Authorizing Provider  albuterol (VENTOLIN HFA) 108 (90 Base) MCG/ACT inhaler Inhale 1-2 puffs into the lungs 2 (two) times daily as needed for wheezing or shortness of breath. 04/24/19  Yes [provider]  allopurinol (ZYLOPRIM) 300 MG tablet  Oral for 90 Days   Yes [provider]  atorvastatin (LIPITOR) 20 MG tablet Oral for 90 Days 03/21/23  Yes [provider]  carvedilol (COREG) 6.25 MG tablet Take 6.25 mg by mouth 2 (two) times daily with a meal. 02/12/14  Yes [provider]  cetirizine (ZYRTEC) 10 MG chewable tablet Chew 10 mg by mouth daily.   Yes [provider]  Cholecalciferol (VITAMIN D) 50 MCG (2000 UT) CAPS Take 1 capsule by mouth daily.   Yes [provider]  cyanocobalamin (VITAMIN B12) 1000 MCG tablet Take 1,000 mcg by mouth daily.   Yes [provider]  cyclobenzaprine (FLEXERIL) 10 MG tablet Take 10 mg by mouth at bedtime. 04/15/23  Yes [provider]  dicyclomine (BENTYL) 10 MG capsule Take 10 mg by mouth daily as needed for spasms.   Yes [provider]  DULoxetine (CYMBALTA) 30 MG capsule Take 30 mg by mouth daily.   Yes [provider]  fluticasone (FLONASE) 50 MCG/ACT nasal spray Place 2 sprays into both nostrils daily.   Yes [provider]  furosemide (LASIX) 20 MG tablet Take 20 mg by mouth daily. 02/12/14  Yes [provider]  gemfibrozil (LOPID) 600 MG tablet Take 600 mg by mouth daily.  02/04/14  Yes [provider]  HYDROcodone-acetaminophen (NORCO) 10-325 MG per tablet Take 1 tablet by mouth every 6 (six) hours as needed for moderate pain.  02/18/14  Yes [provider]  hydrOXYzine (ATARAX) 10 MG tablet TAKE 1 TO 2 TABLETS BY MOUTH EVERY 8 HOURS AS NEEDED FOR ANXIETY Oral for 10 Days 02/21/23  Yes [provider]  lactulose (CHRONULAC) 10 GM/15ML solution Take 15 mLs (10 g total) by mouth daily. 02/15/23 02/10/24 Yes Bich Mchaney, Gerrit Friends, MD  Multiple Vitamin (MULTIVITAMIN) tablet Take 1 tablet by mouth daily.   Yes [provider]  naloxone (NARCAN) nasal spray 4 mg/0.1 mL Place 1 spray into the nose as needed.   Yes [provider]  ondansetron (ZOFRAN-ODT) 4 MG  disintegrating tablet Take 4 mg by mouth every 8 (eight) hours as needed for nausea or vomiting.  02/05/14  Yes [provider]  pantoprazole (PROTONIX) 40 MG tablet Take 40 mg by mouth daily.  01/28/14  Yes [provider]  traZODone (DESYREL) 100 MG tablet Take 100 mg by mouth at bedtime. 04/21/19  Yes [provider]  warfarin (COUMADIN) 5 MG tablet Take 5 mg by mouth See admin instructions. Except for tues and thurs - takes 2.5mg . 12/17/13  Yes [provider]    Allergies as of 05/10/2023 - Review Complete 05/10/2023  Allergen Reaction Noted   Other  03/08/2019   Pneumococcal 13-val conj vacc Swelling 04/25/2015   Sulfonamide derivatives Other (See Comments)    Sulfur  05/10/2023    No family history on file.  Social History   Socioeconomic History   Marital status: Married    Spouse name: Not on file   Number of children: Not on file   Years of education: Not on file   Highest education level: Not on file  Occupational History   Not on file  Tobacco Use   Smoking status: Never   Smokeless tobacco: Never  Substance and Sexual Activity   Alcohol use: No   Drug use: No   Sexual activity: Not on file  Other Topics Concern   Not on file  Social History Narrative   Not on file   Social Determinants of Health   Financial Resource Strain: Not on file  Food Insecurity: Not on file  Transportation Needs: Not on file  Physical Activity: Not on file  Stress: Not on file  Social Connections: Not on file  Intimate Partner Violence: Not on file    Review of Systems: See HPI, otherwise negative ROS  Physical Exam: BP 123/77 (BP Location: Right Arm, Patient Position: Sitting, Cuff Size: Normal)   Pulse 76   Temp 98 F (36.7 C) (Oral)   Ht 5\' 8"  (1.727 m)   Wt 179 lb 3.2 oz (81.3 kg)   SpO2 97%   BMI 27.25 kg/m  General:   Alert,  Well-developed, well-nourished, pleasant and cooperative in NAD Mouth:  No deformity or lesions. Neck:   Supple; no masses or thyromegaly. No significant cervical adenopathy. Lungs:  Clear throughout to auscultation.   No wheezes, crackles, or rhonchi. No acute distress. Heart:  Regular rate and rhythm; no murmurs, clicks, rubs,  or gallops. Abdomen: Non-distended, normal bowel sounds.  Soft and nontender without appreciable mass or hepatosplenomegaly.   Impression/Plan: 72 year old gentleman-very pleasant.  Longstanding anticoagulation due to aortic graft.  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  He remains well compensated.  Last hemoglobin 2 months ago 9.3 he has also had constipation and some dysphagia for which he was started on lactulose and a barium esophagram was obtained.  He has more oropharyngeal dysfunction then an esophageal obstruction  -small Zenker's may be contributing.  For what ever reason, he never was able to get a speech swallowing evaluation.  Recommendations:  Need updated labs: CBC, Chem-12 and INR  Will go ahead and proceed with small bowel capsule study as soon as can be arranged.  Since it is critical to remain anticoagulated, we can safely do an EGD and a capsule placement without interrupting anticoagulation.  He is averse to coming in the hospital with bridging IV heparin if he can avoid it. The risks, benefits, limitations, alternatives and imponderables have been reviewed with the patient. Potential for esophageal dilation, biopsy, etc. have also been reviewed.  Questions have been answered. All parties agreeable.   Will also proceed with a speech swallowing evaluation as soon as can be arranged  Further recommendations to follow.        Notice: This dictation was prepared with Dragon dictation along with smaller phrase technology. Any transcriptional errors that result from this process are  unintentional and may not be corrected upon review.

## 2023-05-10 NOTE — Patient Instructions (Signed)
It was good to see you again today!  You need updated labs: CBC, Chem-12 and INR  Will go ahead and proceed with small bowel capsule study as soon as can be arranged.  Since it is critical for you to remain anticoagulated we can safely do an EGD and a capsule placement without interrupting your anticoagulation.  Will also proceed with a speech swallowing evaluation as soon as can be arranged  Other recommendations to follow.

## 2023-05-11 ENCOUNTER — Other Ambulatory Visit: Payer: Self-pay | Admitting: *Deleted

## 2023-05-11 DIAGNOSIS — K219 Gastro-esophageal reflux disease without esophagitis: Secondary | ICD-10-CM

## 2023-05-11 DIAGNOSIS — R1310 Dysphagia, unspecified: Secondary | ICD-10-CM

## 2023-05-13 ENCOUNTER — Encounter: Payer: Self-pay | Admitting: Internal Medicine

## 2023-06-01 ENCOUNTER — Other Ambulatory Visit (HOSPITAL_COMMUNITY): Payer: Self-pay | Admitting: Occupational Therapy

## 2023-06-01 ENCOUNTER — Telehealth: Payer: Self-pay | Admitting: *Deleted

## 2023-06-01 DIAGNOSIS — R1312 Dysphagia, oropharyngeal phase: Secondary | ICD-10-CM

## 2023-06-01 DIAGNOSIS — K219 Gastro-esophageal reflux disease without esophagitis: Secondary | ICD-10-CM

## 2023-06-01 NOTE — Telephone Encounter (Signed)
LMOVM to call back to schedule EGD with givens capsule placement, Dr. Jena Gauss, ASA 3. Patient to stay on Coumadin.  Also per encounter form, he wants swallow eval after procedure is done.

## 2023-06-02 NOTE — Telephone Encounter (Signed)
Patient son stated patient is already scheduled for a swallowing eval with speech 07/06/23. He wants to know if he needs to have both of these done. Patient has some dementia and live away. Just wanting to see what to do so it doesn't a lot of confusion for pt.

## 2023-06-10 NOTE — Telephone Encounter (Addendum)
Rourk, Gerrit Friends, MD  Armstead Peaks, CMA So he has oropharyngeal dysphagia.  Ought to have a speech evaluation.  He had a life-threatening bleeding [place] and upper and lower endoscopy did not find a cause therefore, he ought to probably have a capsule endoscopy as recommended (would need an EGD to put capsule down) he needs both studies.  ----  Spoke with pt son and made aware. We have scheduled EGD with givens 2/5. Aware will send instructions and pre-op in mail. Confirmed address. Aware patient will stay on his coumadin.

## 2023-06-13 ENCOUNTER — Encounter: Payer: Self-pay | Admitting: *Deleted

## 2023-07-06 ENCOUNTER — Ambulatory Visit (HOSPITAL_COMMUNITY)
Admission: RE | Admit: 2023-07-06 | Discharge: 2023-07-06 | Disposition: A | Discharge status: Home / Self Care | Payer: Medicare Other | Source: Ambulatory Visit | Attending: Internal Medicine | Admitting: Internal Medicine

## 2023-07-06 ENCOUNTER — Ambulatory Visit (HOSPITAL_COMMUNITY): Payer: Medicare Other | Attending: Internal Medicine | Admitting: Speech Pathology

## 2023-07-06 DIAGNOSIS — R1310 Dysphagia, unspecified: Secondary | ICD-10-CM | POA: Insufficient documentation

## 2023-07-06 DIAGNOSIS — R1312 Dysphagia, oropharyngeal phase: Secondary | ICD-10-CM | POA: Diagnosis present

## 2023-07-06 DIAGNOSIS — K219 Gastro-esophageal reflux disease without esophagitis: Secondary | ICD-10-CM | POA: Insufficient documentation

## 2023-07-08 ENCOUNTER — Encounter (HOSPITAL_COMMUNITY): Payer: Self-pay | Admitting: Speech Pathology

## 2023-07-08 NOTE — Therapy (Signed)
 [facility] [address] [place], [place], [address] Phone: [phone]   Fax:  [phone]  Modified Barium Swallow  Patient Details  Name: Lucas Bowman MRN: [id number] Date of Birth: [birth year] No data recorded  Encounter Date: 07/06/2023   End of Session - 07/08/23 0740     Visit Number 1    Number of Visits 1    Activity Tolerance Patient tolerated treatment well             HPI/PMH: HPI: Lucas Bowman is a 73 y.o. male with history of GERD, hiatal hernia, Gastritis and Neck surgery.  Small gastric polyps and negative colonoscopy found; presenting hemoglobin 4.9.  5 units transfused. He remains on Coumadin  for aortic valve graft.  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  He was to have speech /swallowing evaluation due oropharyngeal symptoms.  He had a barium pill esophagram  -  had difficulty swallowing the pill but no esophageal obstruction  - small Zenker's. Pt with no history of speech therapy. MBSS ordered   Clinical Impression: Pt presents with mild/moderate sensorimotor oropharyngeal dysphagia. Pt presents with generalized weakness and generalized oral weakness; also note slow delayed verbal responses and difficulty with memory. Note premature spillage to the level of the pyriforms, delayed swallowing trigger, decreased epiglottic movement, decreased pharyngeal stripping wave and decreased anterior hyoid excursion. With small controlled cup sips of thin liquids note occasional penetration but no aspiration was visualized. With straw sips of thin liquids and sip taken while eating in attempt to clear barium tablet from the valleculae note trace to moderate amounts of aspiration. Trace aspiration was silent and episodes of greater volume resulted in a reflexive weak cough that was not effective in clearing aspirates. NTL trials resulted in increased moderate to severe pharyngeal residue.  With puree and regular textures note diffuse residue and majority of residue visualized in the valleculae seemingly d/t decreased stripping wave and lack of epiglottic movement. Multiple swallows are minimally effective in clearing residue. Pt attempted barium tablet with thin liquids and tablet stopped inthe valleculae and despite multiple attempts of thin liquids, puree and solid textures Pill did not pass through. Pt eventually coughed up intact tablet. 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 Recommend continue with soft diet and thin liquids with NO STRAWS. Recommend crush medications. SLP reviewed findings with Pt and Pt's wife at length. Pt will benefit from [facility] ST or OP ST f/u to provide further strategy recommendations and strengthening exercises. Pt's wife reports Pt has been seen by neuro, however unable to locate this report in epic; if this visit was not recent recommend consider new neuro referral. Thank you for this referral,  Factors that may increase risk of adverse event in presence of aspiration Lucas Bowman 2021): Factors that may increase risk of adverse event in presence of aspiration Lucas Bowman 2021): Frail or deconditioned; Weak cough; Reduced cognitive function   Recommendations/Plan: Swallowing Evaluation Recommendations Swallowing Evaluation Recommendations Recommendations: PO diet PO Diet Recommendation: Dysphagia 3 (Mechanical soft); Thin liquids (Level 0) Liquid Administration via: Cup Medication Administration: Crushed with puree Swallowing strategies  : Multiple dry swallows after each bite/sip; Small bites/sips Postural changes: Position pt fully upright for meals Oral care recommendations: Oral care BID (2x/day)    Treatment Plan No data recorded   Recommendations Recommendations for follow up therapy are one component of a multi-disciplinary discharge planning process, led by  the attending  physician.  Recommendations may be updated based on patient status, additional functional criteria and insurance authorization.  Recommend HH or OP ST f/u for dysphagia  Assessment: Orofacial Exam: Orofacial Exam Oral Cavity: Oral Hygiene: WFL Oral Cavity - Dentition: Missing dentition Orofacial Anatomy: WFL Oral Motor/Sensory Function: WFL    Anatomy:  Anatomy: WFL   Boluses Administered: Boluses Administered Boluses Administered: Thin liquids (Level 0); Mildly thick liquids (Level 2, nectar thick); Moderately thick liquids (Level 3, honey thick); Puree; Solid     Oral Impairment Domain: Oral Impairment Domain Lip Closure: No labial escape Tongue control during bolus hold: Escape to lateral buccal cavity/floor of mouth Bolus preparation/mastication: Slow prolonged chewing/mashing with complete recollection; Disorganized chewing/mashing with solid pieces of bolus unchewed Bolus transport/lingual motion: Delayed initiation of tongue motion (oral holding); Slow tongue motion Oral residue: Trace residue lining oral structures Location of oral residue : Tongue Initiation of pharyngeal swallow : Pyriform sinuses     Pharyngeal Impairment Domain: Pharyngeal Impairment Domain Soft palate elevation: Trace column of contrast or air between SP and PW Laryngeal elevation: Partial superior movement of thyroid cartilage/partial approximation of arytenoids to epiglottic petiole Anterior hyoid excursion: Partial anterior movement Epiglottic movement: Partial inversion Laryngeal vestibule closure: Incomplete, narrow column air/contrast in laryngeal vestibule Pharyngeal stripping wave : Present - diminished Pharyngeal contraction (A/P view only): N/A Pharyngoesophageal segment opening: Partial distention/partial duration, partial obstruction of flow; Minimal distention/minimal duration, marked obstruction of flow Tongue base retraction: Trace column of contrast or air between tongue base  and PPW; Narrow column of contrast or air between tongue base and PPW Pharyngeal residue: Trace residue within or on pharyngeal structures; Collection of residue within or on pharyngeal structures; Majority of contrast within or on pharyngeal structures Location of pharyngeal residue: Tongue base; Valleculae; Pharyngeal wall; Aryepiglottic folds; Pyriform sinuses; Diffuse (>3 areas)     Esophageal Impairment Domain: Esophageal Impairment Domain Esophageal clearance upright position: -- (Esophageal not complete secondary to Pt postitioning; Small Zenker's visualized in cervical esophagus)    Pill: Pill Consistency administered: Thin liquids (Level 0) Thin liquids (Level 0): Impaired (see clinical impressions)    Penetration/Aspiration Scale Score: Penetration/Aspiration Scale Score 1.  Material does not enter airway: Mildly thick liquids (Level 2, nectar thick); Moderately thick liquids (Level 3, honey thick); Puree; Solid; Pill 6.  Material enters airway, passes BELOW cords then ejected out: Thin liquids (Level 0) 7.  Material enters airway, passes BELOW cords and not ejected out despite cough attempt by patient: Thin liquids (Level 0) 8.  Material enters airway, passes BELOW cords without attempt by patient to eject out (silent aspiration) : Thin liquids (Level 0)    Compensatory Strategies: Compensatory Strategies Compensatory strategies: Yes Straw: Ineffective Other(comment): Effective (small controlled cup sips)       General Information: Caregiver present: Yes   Diet Prior to this Study: Dysphagia 3 (mechanical soft); Thin liquids (Level 0)    Temperature : Normal    Respiratory Status: WFL    Supplemental O2: None (Room air)    History of Recent Intubation: No   Behavior/Cognition: Alert; Cooperative; Pleasant mood  Self-Feeding Abilities: Able to self-feed  Baseline vocal quality/speech: Normal; Aphonic  Volitional Cough: Able to elicit  Volitional  Swallow: Able to elicit  No data recorded  Goal Planning: No data recorded No data recorded No data recorded No data recorded No data recorded  Pain: No data recorded  End of Session: Start Time:No data recorded Stop Time: No data recorded Time  Calculation:No data recorded Charges: No data recorded SLP visit diagnosis: SLP Visit Diagnosis: Dysphagia, unspecified (R13.10)    Past Medical History:  Past Medical History:  Diagnosis Date   Anemia    Depression    Gastritis    GERD (gastroesophageal reflux disease)    Gout    H/O aortic valve replacement    Hemorrhoids    Hiatal hernia    Internal hemorrhoids    Irritable bowel syndrome    Mitral insufficiency    Neck pain    Septic shock (HCC) 03/06/2009   Sleep apnea    Tubular adenoma    Past Surgical History:  Past Surgical History:  Procedure Laterality Date   AORTIC VALVE REPLACEMENT AND MITRAL VALVE REPAIR  01/08/2000   BIOPSY N/A 04/04/2014   Procedure: GASTRIC BIOPSIES;  Surgeon: Lamar CHRISTELLA Hollingshead, MD;  Location: [facility];  Service: Endoscopy;  Laterality: N/A;   CARDIAC VALVE REPLACEMENT  01/08/2000   AVR and Mitral Valve Repair   COLONOSCOPY  01/28/2009   RMR: Internal hemorrhoids, otherwise normal rectum/Sigmoid polyp, status post hot snare polypectomy.ADENOMATOUS POLYP    COLONOSCOPY  02/17/2006   Dr.Rourk-Normal rectum.  Left colon polyps= tubular adenoma,Remainder of the colonic mucosa appeared normal.    COLONOSCOPY WITH PROPOFOL  N/A 04/04/2014   Procedure: COLONOSCOPY WITH PROPOFOL ;  Surgeon: Lamar CHRISTELLA Hollingshead, MD;  Location: [facility];  Service: Endoscopy;  Laterality: N/A;  1018 in cecum, total withdrawal time:   COLONOSCOPY WITH PROPOFOL  N/A 01/23/2023   Procedure: COLONOSCOPY WITH PROPOFOL ;  Surgeon: Rollin Dover, MD;  Location: [facility] ENDOSCOPY;  Service: Gastroenterology;  Laterality: N/A;   CORONARY ANGIOPLASTY     ESOPHAGOGASTRODUODENOSCOPY  01/28/2009   MFM:Upwb distal esophageal erosions  consistent with mild erosive reflux esophagitis/ Small hiatal hernia otherwise.  Upper GI tract appeared normal.   ESOPHAGOGASTRODUODENOSCOPY  02/17/2006   Dr.Rourk-Normal esophagus, small hiatal hernia, otherwise normal stomach, D1 and D2   ESOPHAGOGASTRODUODENOSCOPY (EGD) WITH PROPOFOL  N/A 04/04/2014   Procedure: ESOPHAGOGASTRODUODENOSCOPY (EGD) WITH PROPOFOL ;  Surgeon: Lamar CHRISTELLA Hollingshead, MD;  Location: [facility];  Service: Endoscopy;  Laterality: N/A;   ESOPHAGOGASTRODUODENOSCOPY (EGD) WITH PROPOFOL  N/A 01/23/2023   Procedure: ESOPHAGOGASTRODUODENOSCOPY (EGD) WITH PROPOFOL ;  Surgeon: Rollin Dover, MD;  Location: [facility]COPY;  Service: Gastroenterology;  Laterality: N/A;   GALLBLADDER SURGERY     MALONEY DILATION N/A 04/04/2014   Procedure: AGAPITO DILATION;  Surgeon: Lamar CHRISTELLA Hollingshead, MD;  Location: [facility];  Service: Endoscopy;  Laterality: N/A;  #56 maloney dilator   NECK SURGERY     x2   POLYPECTOMY N/A 04/04/2014   Procedure: POLYPECTOMY;  Surgeon: Lamar CHRISTELLA Hollingshead, MD;  Location: [facility];  Service: Endoscopy;  Laterality: N/A;   POLYPECTOMY  01/23/2023   Procedure: POLYPECTOMY;  Surgeon: Rollin Dover, MD;  Location: [facility] ENDOSCOPY;  Service: Gastroenterology;;   Raguel DEL. Clois KILLIAN, CCC-SLP Speech Language Pathologist  Raguel DEL Clois 07/08/2023, 7:41 AM  Raguel DEL Clois, CCC-SLP 07/08/2023, 7:41 AM  [facility] [address] [place], [place], [address] Phone: [phone]   Fax:  [phone]  Name: Lucas Bowman MRN: [id number] Date of Birth: 1950-09-30

## 2023-07-26 NOTE — Patient Instructions (Addendum)
Lucas Bowman  07/26/2023     @PREFPERIOPPHARMACY @   Your procedure is scheduled on  08/03/2023.   Report [facility] at  [phone]  A.M.   Call this number if you have problems the morning of surgery:  [phone]  If you experience any cold or flu symptoms such as cough, fever, chills, shortness of breath, etc. between now and your scheduled surgery, please notify us at the above number.   Remember:        Use your inhaler before you come and bring your rescue inhaler with you.   Follow the diet instructions given to you by the office.   You may drink clear liquids until  0415  am on 08/03/2023.    Clear liquids allowed are:                    Water, Juice (No red color; non-citric and without pulp; diabetics please choose diet or no sugar options), Carbonated beverages (diabetics please choose diet or no sugar options), Clear Tea (No creamer, milk, or cream, including half & half and powdered creamer), Black Coffee Only (No creamer, milk or cream, including half & half and powdered creamer), and Clear Sports drink (No red color; diabetics please choose diet or no sugar options)    Take these medicines the morning of surgery with A SIP OF WATER          allopurinol, carvedilol, duloxetine, hydrocodone(if needed), hydroxyzine,pantoprazole.     Do not wear jewelry, make-up or nail polish, including gel polish,  artificial nails, or any other type of covering on natural nails (fingers and  toes).  Do not wear lotions, powders, or perfumes, or deodorant.  Do not shave 48 hours prior to surgery.  Men may shave face and neck.  Do not bring valuables to the hospital.  [facility] is not responsible for any belongings or valuables.  Contacts, dentures or bridgework may not be worn into surgery.  Leave your suitcase in the car.  After surgery it may be brought to your room.  For patients admitted to the hospital, discharge time will be determined by your treatment  team.  Patients discharged the day of surgery will not be allowed to drive home and must have someone with them for 24 hours.    Special instructions:   DO NOT smoke tobacco or vape for 24 hours before your procedure.  Please read over the following fact sheets that you were given. Anesthesia Post-op Instructions and Care and Recovery After Surgery       Upper Endoscopy, Adult, Care After After the procedure, it is common to have a sore throat. It is also common to have: Mild stomach pain or discomfort. Bloating. Nausea. Follow these instructions at home: The instructions below may help you care for yourself at home. Your health care provider may give you more instructions. If you have questions, ask your health care provider. If you were given a sedative during the procedure, it can affect you for several hours. Do not drive or operate machinery until your health care provider says that it is safe. If you will be going home right after the procedure, plan to have a responsible adult: Take you home from the hospital or clinic. You will not be allowed to drive. Care for you for the time you are told. Follow instructions from your health care provider about what you may eat and drink. Return to your  normal activities as told by your health care provider. Ask your health care provider what activities are safe for you. Take over-the-counter and prescription medicines only as told by your health care provider. Contact a health care provider if you: Have a sore throat that lasts longer than one day. Have trouble swallowing. Have a fever. Get help right away if you: Vomit blood or your vomit looks like coffee grounds. Have bloody, black, or tarry stools. Have a very bad sore throat or you cannot swallow. Have difficulty breathing or very bad pain in your chest or abdomen. These symptoms may be an emergency. Get help right away. Call 911. Do not wait to see if the symptoms will go  away. Do not drive yourself to the hospital. Summary After the procedure, it is common to have a sore throat, mild stomach discomfort, bloating, and nausea. If you were given a sedative during the procedure, it can affect you for several hours. Do not drive until your health care provider says that it is safe. Follow instructions from your health care provider about what you may eat and drink. Return to your normal activities as told by your health care provider. This information is not intended to replace advice given to you by your health care provider. Make sure you discuss any questions you have with your health care provider. Document Revised: 09/23/2021 Document Reviewed: 09/23/2021 Elsevier Patient Education  2024 [employer]Monitored Anesthesia Care, Care After The following information offers guidance on how to care for yourself after your procedure. Your health care provider may also give you more specific instructions. If you have problems or questions, contact your health care provider. What can I expect after the procedure? After the procedure, it is common to have: Tiredness. Little or no memory about what happened during or after the procedure. Impaired judgment when it comes to making decisions. Nausea or vomiting. Some trouble with balance. Follow these instructions at home: For the time period you were told by your health care provider:  Rest. Do not participate in activities where you could fall or become injured. Do not drive or use machinery. Do not drink alcohol. Do not take sleeping pills or medicines that cause drowsiness. Do not make important decisions or sign legal documents. Do not take care of children on your own. Medicines Take over-the-counter and prescription medicines only as told by your health care provider. If you were prescribed antibiotics, take them as told by your health care provider. Do not stop using the antibiotic even if you start to feel  better. Eating and drinking Follow instructions from your health care provider about what you may eat and drink. Drink enough fluid to keep your urine pale yellow. If you vomit: Drink clear fluids slowly and in small amounts as you are able. Clear fluids include water, ice chips, low-calorie sports drinks, and fruit juice that has water added to it (diluted fruit juice). Eat light and bland foods in small amounts as you are able. These foods include bananas, applesauce, rice, lean meats, toast, and crackers. General instructions  Have a responsible adult stay with you for the time you are told. It is important to have someone help care for you until you are awake and alert. If you have sleep apnea, surgery and some medicines can increase your risk for breathing problems. Follow instructions from your health care provider about wearing your sleep device: When you are sleeping. This includes during daytime naps. While taking prescription pain medicines, sleeping medicines,  or medicines that make you drowsy. Do not use any products that contain nicotine or tobacco. These products include cigarettes, chewing tobacco, and vaping devices, such as e-cigarettes. If you need help quitting, ask your health care provider. Contact a health care provider if: You feel nauseous or vomit every time you eat or drink. You feel light-headed. You are still sleepy or having trouble with balance after 24 hours. You get a rash. You have a fever. You have redness or swelling around the IV site. Get help right away if: You have trouble breathing. You have new confusion after you get home. These symptoms may be an emergency. Get help right away. Call 911. Do not wait to see if the symptoms will go away. Do not drive yourself to the hospital. This information is not intended to replace advice given to you by your health care provider. Make sure you discuss any questions you have with your health care  provider. Document Revised: 11/09/2021 Document Reviewed: 11/09/2021 Elsevier Patient Education  2024 [employer].

## 2023-07-27 ENCOUNTER — Encounter (HOSPITAL_COMMUNITY)
Admission: RE | Admit: 2023-07-27 | Discharge: 2023-07-27 | Disposition: A | Discharge status: Home / Self Care | Payer: Medicare Other | Source: Ambulatory Visit | Attending: Internal Medicine | Admitting: Internal Medicine

## 2023-07-27 ENCOUNTER — Encounter (HOSPITAL_COMMUNITY): Payer: Self-pay

## 2023-07-27 VITALS — BP 122/67 | HR 67 | Temp 97.9°F | Resp 18 | Ht 68.0 in | Wt 179.2 lb

## 2023-07-27 DIAGNOSIS — I4510 Unspecified right bundle-branch block: Secondary | ICD-10-CM | POA: Insufficient documentation

## 2023-07-27 DIAGNOSIS — Z01810 Encounter for preprocedural cardiovascular examination: Secondary | ICD-10-CM | POA: Diagnosis present

## 2023-07-27 DIAGNOSIS — I10 Essential (primary) hypertension: Secondary | ICD-10-CM | POA: Diagnosis not present

## 2023-08-02 NOTE — Anesthesia Preprocedure Evaluation (Addendum)
 Anesthesia Evaluation  Patient identified by MRN, date of birth, ID band Patient awake    Reviewed: Allergy & Precautions, NPO status , Patient's Chart, lab work & pertinent test results  Airway Mallampati: I  TM Distance: >3 FB Neck ROM: Full    Dental no notable dental hx. (+) Dental Advisory Given, Teeth Intact   Pulmonary sleep apnea and Continuous Positive Airway Pressure Ventilation    Pulmonary exam normal        Cardiovascular hypertension, Pt. on medications and Pt. on home beta blockers Normal cardiovascular exam+ Valvular Problems/Murmurs MR   Aortic valve replacement   Neuro/Psych  PSYCHIATRIC DISORDERS  Depression     Neuromuscular disease    GI/Hepatic Neg liver ROS, hiatal hernia, Bowel prep,GERD  Medicated,,  Endo/Other  negative endocrine ROS    Renal/GU negative Renal ROS     Musculoskeletal  (+) Arthritis ,    Abdominal   Peds  Hematology  (+) Blood dyscrasia (Warfarin), anemia   Anesthesia Other Findings IDA.  Chronic pain  Reproductive/Obstetrics                             Anesthesia Physical Anesthesia Plan  ASA: 3  Anesthesia Plan: General   Post-op Pain Management: Minimal or no pain anticipated   Induction: Intravenous  PONV Risk Score and Plan: 1 and Propofol  infusion  Airway Management Planned: Nasal Cannula and Natural Airway  Additional Equipment: None  Intra-op Plan:   Post-operative Plan:   Informed Consent: I have reviewed the patients History and Physical, chart, labs and discussed the procedure including the risks, benefits and alternatives for the proposed anesthesia with the patient or authorized representative who has indicated his/her understanding and acceptance.     Dental advisory given  Plan Discussed with: CRNA  Anesthesia Plan Comments:        Anesthesia Quick Evaluation

## 2023-08-03 ENCOUNTER — Ambulatory Visit (HOSPITAL_COMMUNITY): Payer: Self-pay | Admitting: Anesthesiology

## 2023-08-03 ENCOUNTER — Encounter (HOSPITAL_COMMUNITY)
Admission: RE | Disposition: A | Discharge status: Home / Self Care | Payer: Self-pay | Source: Home / Self Care | Attending: Internal Medicine

## 2023-08-03 ENCOUNTER — Ambulatory Visit (HOSPITAL_BASED_OUTPATIENT_CLINIC_OR_DEPARTMENT_OTHER): Payer: Self-pay | Admitting: Anesthesiology

## 2023-08-03 ENCOUNTER — Ambulatory Visit (HOSPITAL_COMMUNITY)
Admission: RE | Admit: 2023-08-03 | Discharge: 2023-08-03 | Disposition: A | Discharge status: Home / Self Care | Payer: Medicare Other | Attending: Internal Medicine | Admitting: Internal Medicine

## 2023-08-03 DIAGNOSIS — K921 Melena: Secondary | ICD-10-CM | POA: Diagnosis not present

## 2023-08-03 DIAGNOSIS — Z952 Presence of prosthetic heart valve: Secondary | ICD-10-CM | POA: Insufficient documentation

## 2023-08-03 DIAGNOSIS — Z7901 Long term (current) use of anticoagulants: Secondary | ICD-10-CM | POA: Diagnosis not present

## 2023-08-03 DIAGNOSIS — B3781 Candidal esophagitis: Secondary | ICD-10-CM | POA: Diagnosis not present

## 2023-08-03 DIAGNOSIS — K2289 Other specified disease of esophagus: Secondary | ICD-10-CM

## 2023-08-03 DIAGNOSIS — K3189 Other diseases of stomach and duodenum: Secondary | ICD-10-CM | POA: Diagnosis not present

## 2023-08-03 DIAGNOSIS — I10 Essential (primary) hypertension: Secondary | ICD-10-CM | POA: Diagnosis not present

## 2023-08-03 DIAGNOSIS — G4730 Sleep apnea, unspecified: Secondary | ICD-10-CM | POA: Insufficient documentation

## 2023-08-03 DIAGNOSIS — R1310 Dysphagia, unspecified: Secondary | ICD-10-CM | POA: Diagnosis present

## 2023-08-03 DIAGNOSIS — K766 Portal hypertension: Secondary | ICD-10-CM | POA: Diagnosis not present

## 2023-08-03 DIAGNOSIS — D649 Anemia, unspecified: Secondary | ICD-10-CM | POA: Diagnosis not present

## 2023-08-03 DIAGNOSIS — K317 Polyp of stomach and duodenum: Secondary | ICD-10-CM | POA: Insufficient documentation

## 2023-08-03 DIAGNOSIS — G4733 Obstructive sleep apnea (adult) (pediatric): Secondary | ICD-10-CM | POA: Diagnosis not present

## 2023-08-03 DIAGNOSIS — K219 Gastro-esophageal reflux disease without esophagitis: Secondary | ICD-10-CM | POA: Diagnosis not present

## 2023-08-03 HISTORY — PX: ESOPHAGOGASTRODUODENOSCOPY (EGD) WITH PROPOFOL: SHX5813

## 2023-08-03 HISTORY — PX: GIVENS CAPSULE STUDY: SHX5432

## 2023-08-03 HISTORY — PX: ESOPHAGEAL BRUSHING: SHX6842

## 2023-08-03 LAB — KOH PREP

## 2023-08-03 SURGERY — ESOPHAGOGASTRODUODENOSCOPY (EGD) WITH PROPOFOL
Anesthesia: General

## 2023-08-03 MED ORDER — ONDANSETRON HCL 4 MG/2ML IJ SOLN
INTRAMUSCULAR | Status: AC
  Administered 2023-08-03: 4 mg
  Filled 2023-08-03: qty 2

## 2023-08-03 MED ORDER — SODIUM CHLORIDE 0.9% FLUSH: 3.0000 mL | INTRAVENOUS | Status: DC | PRN

## 2023-08-03 MED ORDER — LACTATED RINGERS IV SOLN: INTRAVENOUS | Status: DC | PRN

## 2023-08-03 MED ORDER — PROPOFOL 500 MG/50ML IV EMUL
INTRAVENOUS | Status: DC | PRN
  Administered 2023-08-03: 150 ug/kg/min via INTRAVENOUS

## 2023-08-03 MED ORDER — SODIUM CHLORIDE 0.9% FLUSH: 3.0000 mL | Freq: Two times a day (BID) | INTRAVENOUS | Status: DC

## 2023-08-03 MED ORDER — SODIUM CHLORIDE FLUSH 0.9 % IV SOLN
INTRAVENOUS | Status: AC
  Filled 2023-08-03: qty 10

## 2023-08-03 MED ORDER — PROPOFOL 10 MG/ML IV BOLUS
INTRAVENOUS | Status: DC | PRN
  Administered 2023-08-03: 100 mg via INTRAVENOUS
  Administered 2023-08-03: 40 mg via INTRAVENOUS

## 2023-08-03 MED ORDER — LIDOCAINE HCL (PF) 2 % IJ SOLN
INTRAMUSCULAR | Status: DC | PRN
  Administered 2023-08-03: 100 mg via INTRADERMAL

## 2023-08-03 NOTE — H&P (Signed)
 @LOGO @   Primary Care Physician:  Lawrance Handing, MD Primary Gastroenterologist:  Dr. Shaaron  Pre-Procedure History & Physical: HPI:  Lucas Bowman is a 73 y.o. male here for  for further evaluation of torrential GI bleed last year.  EGD and colonoscopy essentially negative in [place].  Aortic valve in place chronically anticoagulated with Coumadin .  Hemoglobin holding steady in the low 11 range.  No hematemesis melena or hematochezia.  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  Tubular esophagus appeared normal on prior EGD.  He is now here for capsule placement.  He declined Coumadin  washout and IV heparin  requiring hospitalization after some discussion.  He wishes to have this capsule placed without interrupting his anticoagulation therapy.  Past Medical History:  Diagnosis Date   Anemia    Depression    Gastritis    GERD (gastroesophageal reflux disease)    Gout    H/O aortic valve replacement    Hemorrhoids    Hiatal hernia    Internal hemorrhoids    Irritable bowel syndrome    Mitral insufficiency    Neck pain    Septic shock (HCC) 03/06/2009   Sleep apnea    Tubular adenoma     Past Surgical History:  Procedure Laterality Date   AORTIC VALVE REPLACEMENT AND MITRAL VALVE REPAIR  01/08/2000   BIOPSY N/A 04/04/2014   Procedure: GASTRIC BIOPSIES;  Surgeon: Lucas CHRISTELLA Shaaron, MD;  Location: [facility];  Service: Endoscopy;  Laterality: N/A;   CARDIAC VALVE REPLACEMENT  01/08/2000   AVR and Mitral Valve Repair   COLONOSCOPY  01/28/2009   RMR: Internal hemorrhoids, otherwise normal rectum/Sigmoid polyp, status post hot snare polypectomy.ADENOMATOUS POLYP    COLONOSCOPY  02/17/2006   Dr.Corrin Hingle-Normal rectum.  Left colon polyps= tubular adenoma,Remainder of the colonic mucosa appeared normal.    COLONOSCOPY WITH PROPOFOL  N/A 04/04/2014   Procedure: COLONOSCOPY WITH PROPOFOL ;  Surgeon: Lucas CHRISTELLA Shaaron, MD;  Location: [facility];  Service: Endoscopy;  Laterality: N/A;   1018 in cecum, total withdrawal time:   COLONOSCOPY WITH PROPOFOL  N/A 01/23/2023   Procedure: COLONOSCOPY WITH PROPOFOL ;  Surgeon: Rollin Dover, MD;  Location[facility]COPY;  Service: Gastroenterology;  Laterality: N/A;   CORONARY ANGIOPLASTY     ESOPHAGOGASTRODUODENOSCOPY  01/28/2009   MFM:Upwb distal esophageal erosions consistent with mild erosive reflux esophagitis/ Small hiatal hernia otherwise.  Upper GI tract appeared normal.   ESOPHAGOGASTRODUODENOSCOPY  02/17/2006   Dr.Samara Stankowski-Normal esophagus, small hiatal hernia, otherwise normal stomach, D1 and D2   ESOPHAGOGASTRODUODENOSCOPY (EGD) WITH PROPOFOL  N/A 04/04/2014   Procedure: ESOPHAGOGASTRODUODENOSCOPY (EGD) WITH PROPOFOL ;  Surgeon: Lucas CHRISTELLA Shaaron, MD;  Location: [facility];  Service: Endoscopy;  Laterality: N/A;   ESOPHAGOGASTRODUODENOSCOPY (EGD) WITH PROPOFOL  N/A 01/23/2023   Procedure: ESOPHAGOGASTRODUODENOSCOPY (EGD) WITH PROPOFOL ;  Surgeon: Rollin Dover, MD;  Location: [facility] ENDOSCOPY;  Service: Gastroenterology;  Laterality: N/A;   GALLBLADDER SURGERY     MALONEY DILATION N/A 04/04/2014   Procedure: AGAPITO DILATION;  Surgeon: Lucas CHRISTELLA Shaaron, MD;  Location: [facility];  Service: Endoscopy;  Laterality: N/A;  #56 maloney dilator   NECK SURGERY     x2   POLYPECTOMY N/A 04/04/2014   Procedure: POLYPECTOMY;  Surgeon: Lucas CHRISTELLA Shaaron, MD;  Location: [facility];  Service: Endoscopy;  Laterality: N/A;   POLYPECTOMY  01/23/2023   Procedure: POLYPECTOMY;  Surgeon: Rollin Dover, MD;  Location[facility] ENDOSCOPY;  Service: Gastroenterology;;    Prior to Admission medications   Medication Sig Start Date End Date Taking? Authorizing Provider  albuterol  (VENTOLIN  HFA) 108 (90 Base) MCG/ACT inhaler Inhale 1-2 puffs into the lungs 2 (two) times daily as needed for wheezing or shortness of breath. 04/24/19  Yes [provider]  allopurinol  (ZYLOPRIM ) 300 MG tablet Oral for 90 Days   Yes [provider]  atorvastatin (LIPITOR) 20 MG tablet  Oral for 90 Days 03/21/23  Yes [provider]  carvedilol  (COREG ) 6.25 MG tablet Take 6.25 mg by mouth 2 (two) times daily with a meal. 02/12/14  Yes [provider]  cetirizine (ZYRTEC) 10 MG chewable tablet Chew 10 mg by mouth daily.   Yes [provider]  Cholecalciferol (VITAMIN D) 50 MCG (2000 UT) CAPS Take 1 capsule by mouth daily.   Yes [provider]  cyanocobalamin (VITAMIN B12) 1000 MCG tablet Take 1,000 mcg by mouth daily.   Yes [provider]  cyclobenzaprine  (FLEXERIL ) 10 MG tablet Take 10 mg by mouth at bedtime. 04/15/23  Yes [provider]  DULoxetine  (CYMBALTA ) 30 MG capsule Take 30 mg by mouth daily.   Yes [provider]  fluticasone  (FLONASE ) 50 MCG/ACT nasal spray Place 2 sprays into both nostrils daily.   Yes [provider]  furosemide (LASIX) 20 MG tablet Take 20 mg by mouth daily. 02/12/14  Yes [provider]  gemfibrozil (LOPID) 600 MG tablet Take 600 mg by mouth daily.  02/04/14  Yes [provider]  HYDROcodone -acetaminophen  (NORCO) 10-325 MG per tablet Take 1 tablet by mouth every 6 (six) hours as needed for moderate pain.  02/18/14  Yes [provider]  hydrOXYzine (ATARAX) 10 MG tablet TAKE 1 TO 2 TABLETS BY MOUTH EVERY 8 HOURS AS NEEDED FOR ANXIETY Oral for 10 Days 02/21/23  Yes [provider]  lactulose  (CHRONULAC ) 10 GM/15ML solution Take 15 mLs (10 g total) by mouth daily. 02/15/23 02/10/24 Yes Breaunna Gottlieb, Lucas HERO, MD  Multiple Vitamin (MULTIVITAMIN) tablet Take 1 tablet by mouth daily.   Yes [provider]  ondansetron  (ZOFRAN -ODT) 4 MG disintegrating tablet Take 4 mg by mouth every 8 (eight) hours as needed for nausea or vomiting.  02/05/14  Yes [provider]  pantoprazole  (PROTONIX ) 40 MG tablet Take 40 mg by mouth daily.  01/28/14  Yes [provider]  traZODone  (DESYREL ) 100 MG tablet Take 100 mg by mouth at bedtime. 04/21/19  Yes  [provider]  warfarin (COUMADIN ) 5 MG tablet Take 5 mg by mouth See admin instructions. Except for tues and thurs - takes 2.5mg . 12/17/13  Yes [provider]  dicyclomine (BENTYL) 10 MG capsule Take 10 mg by mouth daily as needed for spasms.    [provider]  naloxone [facility]) nasal spray 4 mg/0.1 mL Place 1 spray into the nose as needed.    [provider]    Allergies as of 06/10/2023 - Review Complete 05/10/2023  Allergen Reaction Noted   Other  03/08/2019   Pneumococcal 13-val conj vacc Swelling 04/25/2015   Sulfonamide derivatives Other (See Comments)    Sulfur  05/10/2023    No family history on file.  Social History   Socioeconomic History   Marital status: Married    Spouse name: Not on file   Number of children: Not on file   Years of education: Not on file   Highest education level: Not on file  Occupational History   Not on file  Tobacco Use   Smoking status: Never   Smokeless tobacco: Never  Substance and Sexual Activity  Alcohol use: No   Drug use: No   Sexual activity: Not on file  Other Topics Concern   Not on file  Social History Narrative   Not on file   Social Drivers of Health   Financial Resource Strain: Not on file  Food Insecurity: Not on file  Transportation Needs: Not on file  Physical Activity: Not on file  Stress: Not on file  Social Connections: Not on file  Intimate Partner Violence: Not on file    Review of Systems: See HPI, otherwise negative ROS  Physical Exam: BP (!) 124/99 (BP Location: Right Arm)   Pulse 86   Temp 97.7 F (36.5 C)   Resp 18   SpO2 100%  General:   Alert,  Well-developed, well-nourished, pleasant and cooperative in NAD Neck:  Supple; no masses or thyromegaly. No significant cervical adenopathy. Lungs:  Clear throughout to auscultation.   No wheezes, crackles, or rhonchi. No acute distress. Heart: irregular rhythm prosthetic heart sounds Abdomen: Non-distended,  normal bowel sounds.  Soft and nontender without appreciable mass or hepatosplenomegaly.  Pulses:  Normal pulses noted. Extremities:  Without clubbing or edema.  Impression/Plan:    Pleasant 74 year old gentleman with aortic valve replacement mitral valve repair course complicated by CVA with good recovery.  Torrential GI bleed last year hemoglobin down to 4.9 negative EGD and colonoscopy has not had any evidence of recurrent bleeding recent hemoglobin 11 range.  Small bowel needs evaluated to complete his GI evaluation.  Issues with ongoing anticoagulation discussed today and previously.  Patient wishes not to  interrupt anticoagulation with bridging your.  This is not unreasonable.  We can safely do a diagnostic EGD and placed a capsule without interrupting anticoagulation.  Risk benefits limitations have been reviewed previously and again at the bedside.  Also oropharyngeal dysfunction noted recent speech therapy evaluation follow-up with neurology recommended.  He has a very small Zenker's diverticulum.       Notice: This dictation was prepared with Dragon dictation along with smaller phrase technology. Any transcriptional errors that result from this process are unintentional and may not be corrected upon review.

## 2023-08-03 NOTE — Anesthesia Procedure Notes (Signed)
 Date/Time: 08/03/2023 9:24 AM  Performed by: Eliodoro Deward FALCON, CRNAPre-anesthesia Checklist: Patient identified, Emergency Drugs available, Suction available and Patient being monitored Patient Re-evaluated:Patient Re-evaluated prior to induction Oxygen Delivery Method: Nasal cannula Induction Type: IV induction Placement Confirmation: positive ETCO2 Comments: Optiflow High Flow [place] O2 used.

## 2023-08-03 NOTE — Discharge Instructions (Signed)
 EGD Discharge instructions Please read the instructions outlined below and refer to this sheet in the next few weeks. These discharge instructions provide you with general information on caring for yourself after you leave the hospital. Your doctor may also give you specific instructions. While your treatment has been planned according to the most current medical practices available, unavoidable complications occasionally occur. If you have any problems or questions after discharge, please call your doctor. ACTIVITY You may resume your regular activity but move at a slower pace for the next 24 hours.  Take frequent rest periods for the next 24 hours.  Walking will help expel (get rid of) the air and reduce the bloated feeling in your abdomen.  No driving for 24 hours (because of the anesthesia (medicine) used during the test).  You may shower.  Do not sign any important legal documents or operate any machinery for 24 hours (because of the anesthesia used during the test).  NUTRITION Drink plenty of fluids.  You may resume your normal diet.  Begin with a light meal and progress to your normal diet.  Avoid alcoholic beverages for 24 hours or as instructed by your caregiver.  MEDICATIONS You may resume your normal medications unless your caregiver tells you otherwise.  WHAT YOU CAN EXPECT TODAY You may experience abdominal discomfort such as a feeling of fullness or "gas" pains.  FOLLOW-UP Your doctor will discuss the results of your test with you.  SEEK IMMEDIATE MEDICAL ATTENTION IF ANY OF THE FOLLOWING OCCUR: Excessive nausea (feeling sick to your stomach) and/or vomiting.  Severe abdominal pain and distention (swelling).  Trouble swallowing.  Temperature over 101 F (37.8 C).  Rectal bleeding or vomiting of blood.      capsule was successfully placed into your small intestine  Follow diet as instructed following capsule ingestion  It appears you may have thrush and Candida  esophagitis.  Samples taken to confirm.  This may be contributing to difficulties with swallowing.  Further  recommendations to follow.    At patient request, I called Leyton Magoon at [phone] findings and recommendations

## 2023-08-03 NOTE — Anesthesia Postprocedure Evaluation (Signed)
 Anesthesia Post Note  Patient: Lucas Bowman  Procedure(s) Performed: ESOPHAGOGASTRODUODENOSCOPY (EGD) WITH PROPOFOL  GIVENS CAPSULE STUDY ESOPHAGEAL BRUSHING  Patient location during evaluation: PACU Anesthesia Type: General Level of consciousness: awake and alert Pain management: pain level controlled Vital Signs Assessment: post-procedure vital signs reviewed and stable Respiratory status: spontaneous breathing, nonlabored ventilation, respiratory function stable and patient connected to nasal cannula oxygen Cardiovascular status: blood pressure returned to baseline and stable Postop Assessment: no apparent nausea or vomiting Anesthetic complications: no   There were no known notable events for this encounter.   Last Vitals:  Vitals:   08/03/23 0815 08/03/23 0955  BP:  116/64  Pulse: 86 76  Resp: 18 15  Temp:  36.6 C  SpO2: 100% 95%    Last Pain:  Vitals:   08/03/23 0955  TempSrc: Oral  PainSc: 0-No pain                 Kathrine Rieves L Arieal Cuoco

## 2023-08-03 NOTE — Op Note (Signed)
 [facility] Patient Name: Lucas Bowman Procedure Date: 08/03/2023 8:54 AM MRN: [id number] Date of Birth: October 23, 1950 Attending MD: Lamar Ozell Hollingshead , MD, [id number] CSN: [id number] Age: 73 Admit Type: Outpatient Procedure:                Upper GI endoscopy Indications:              Dysphagia, Hematochezia Providers:                Lamar Ozell Hollingshead, MD, Devere Lodge, Jon Loge Referring MD:              Medicines:                Propofol  per Anesthesia Complications:            No immediate complications. Estimated Blood Loss:     Estimated blood loss was minimal. Procedure:                Pre-Anesthesia Assessment:                           - Prior to the procedure, a History and Physical                            was performed, and patient medications and                            allergies were reviewed. The patient's tolerance of                            previous anesthesia was also reviewed. The risks                            and benefits of the procedure and the sedation                            options and risks were discussed with the patient.                            All questions were answered, and informed consent                            was obtained. Prior Anticoagulants: The patient                            last took Coumadin  (warfarin) on the day of the                            procedure. ASA Grade Assessment: III - A patient                            with severe systemic disease. After reviewing the  risks and benefits, the patient was deemed in                            satisfactory condition to undergo the procedure.                           After obtaining informed consent, the endoscope was                            passed under direct vision. Throughout the                            procedure, the patient's blood pressure, pulse, and                            oxygen  saturations were monitored continuously. The                            GIF-H190 ([id number]) scope was introduced through the                            mouth, and advanced to the third part of duodenum.                            The upper GI endoscopy was accomplished without                            difficulty. The patient tolerated the procedure                            well. Scope In: 9:30:17 AM Scope Out: 9:48:01 AM Total Procedure Duration: 0 hours 17 minutes 44 seconds  Findings:      White raised plaques extending from the back of the tongue down into the       tubular esophagus scattered in the proximal overlying the esophageal       mucosa. Tubular esophagus patent throughout its course. Fundic gland       type polyps present in the stomach largest about 8 mm pedunculated. Not       manipulated. Slight deformity of the antrum/pyloric channel. Otherwise,       mucosa. Normal      The duodenal bulb, second portion of the duodenum and third portion of       the duodenum were normal. Scope withdrawn and the capsule deployment       device was loaded up. It was taken down into the antrum and passed       across the pylorus and deployed. There was some mild difficulty getting       it into the esophagus from the hypopharynx. There was minimal bleeding       with this maneuver. Finally, KOH brushings of the esophagus taken Impression:               - Thrush. Probable Candida esophagitis. KOH pending.                           -Gastric polyps reidentified.  Not manipulated.                            Appeared innocent. Mild deformity of the                            pylorus/distal antrum of uncertain significance.                           -Normal duodenal bulb, second portion of the                            duodenum and third portion of the duodenum.                           -Status post capsule deployment into the duodenum Moderate Sedation:      Moderate (conscious) sedation  was personally administered by an       anesthesia professional. The following parameters were monitored: oxygen       saturation, heart rate, blood pressure, respiratory rate, EKG, adequacy       of pulmonary ventilation, and response to care. Recommendation:           - Patient has a contact number available for                            emergencies. The signs and symptoms of potential                            delayed complications were discussed with the                            patient. Return to normal activities tomorrow.                            Written discharge instructions were provided to the                            patient.                           - Per capsule protocol. Follow-up on KOH. Capsule                            images to be reviewed as they become available. Procedure Code(s):        --- Professional ---                           [phone], Esophagogastroduodenoscopy, flexible,                            transoral; diagnostic, including collection of                            specimen(s) by brushing or washing, when performed                            (  separate procedure) Diagnosis Code(s):        --- Professional ---                           R13.10, Dysphagia, unspecified                           K92.1, Melena (includes Hematochezia) CPT copyright 2022 [employer]. All rights reserved. The codes documented in this report are preliminary and upon coder review may  be revised to meet current compliance requirements. Lamar HERO. Mone Commisso, MD Lamar Ozell Hollingshead, MD 08/03/2023 10:13:55 AM This report has been signed electronically. Number of Addenda: 0

## 2023-08-03 NOTE — Transfer of Care (Signed)
 Immediate Anesthesia Transfer of Care Note  Patient: Lucas Bowman  Procedure(s) Performed: ESOPHAGOGASTRODUODENOSCOPY (EGD) WITH PROPOFOL  GIVENS CAPSULE STUDY ESOPHAGEAL BRUSHING  Patient Location: Short Stay  Anesthesia Type:General  Level of Consciousness: drowsy  Airway & Oxygen Therapy: Patient Spontanous Breathing  Post-op Assessment: Report given to RN and Post -op Vital signs reviewed and stable  Post vital signs: Reviewed and stable  Last Vitals:  Vitals Value Taken Time  BP    Temp    Pulse    Resp    SpO2      Last Pain:  Vitals:   08/03/23 0923  PainSc: 5          Complications: No notable events documented.

## 2023-08-04 ENCOUNTER — Encounter (HOSPITAL_COMMUNITY): Payer: Self-pay | Admitting: Internal Medicine

## 2023-08-04 ENCOUNTER — Other Ambulatory Visit: Payer: Self-pay

## 2023-08-04 ENCOUNTER — Telehealth: Payer: Self-pay | Admitting: Gastroenterology

## 2023-08-04 NOTE — Telephone Encounter (Signed)
 Error

## 2023-08-09 DIAGNOSIS — Z8719 Personal history of other diseases of the digestive system: Secondary | ICD-10-CM | POA: Diagnosis not present

## 2023-08-09 DIAGNOSIS — D509 Iron deficiency anemia, unspecified: Secondary | ICD-10-CM | POA: Diagnosis not present

## 2023-08-09 DIAGNOSIS — K269 Duodenal ulcer, unspecified as acute or chronic, without hemorrhage or perforation: Secondary | ICD-10-CM | POA: Diagnosis not present

## 2023-08-09 NOTE — Op Note (Addendum)
 Small Bowel Givens Capsule Study Procedure date:  08/03/2023  PCP:  Dr. Lawrance Handing, MD  Indication for procedure:  Anemia, history of GI bleed  Patient data:  Wt: 81.3kg Ht: 5'8  Findings:   Study complete to the cecum. Multiple scattered erosions within the proximal small bowel. Whisps of blood noted  starting about 56 minutes into the study with larger amounts contained within mucus in the proximal small bowel.  Beginning about 04:46:28 into the study apthous ulcer was noted and continued throughout the rest of the small bowel , increasing in number distally. See report below  First Gastric image:  N/A - placed directly into the small bowel via EGD First Duodenal image: 00:10:15 First Ileo-Cecal Valve image: 06:15:54 First Cecal image: 06:15:00 Gastric Passage time: N/A Small Bowel Passage time:  6h 33m  Summary & Recommendations: Capsule study performed for further evaluation of anemia after unremarkable colonoscopy and EGD while inpatient last year with severe anemia and GI bleed. Recent labs with Hgb 11.6 and iron saturation low normal and iron 56. Study complete to the cecum. Multiple erosions with whisps of blood noted, but not from any specific identifiable lesion. 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 TI not intubated on previous colonoscopy.   Images reviewed with Dr. Shaaron.    Regular monitoring of hemoglobin outpatient.  Consider iron therapy if further drop in hemoglobin Could consider prometheus IBD panel vs ileocolonoscopy for biopsies (could be difficult given he would need bridging heparin  drip).  Continue PPI daily.  Given no significant bleed proximally would not pursue enteroscopy  Charmaine Melia, MSN, APRN, FNP-BC, AGACNP-BC [facility]  Attending note: Pertinent images reviewed.  Agree with assessment and recommendations

## 2023-08-22 ENCOUNTER — Telehealth: Payer: Self-pay

## 2023-08-22 NOTE — Telephone Encounter (Signed)
-----   Message from Eula Listen sent at 08/20/2023  2:06 PM EST ----- Capsule study reviewed.  I am not sure if patient was ever given results.  Can you check if he is not being given the result just let him know that he has some irritated areas in his small intestine which may have been bleeding no evidence of anything large like an ulcer or tumor.  He ought to have an appointment to come back and see Within the next 2 to 3 months.  Thanks.

## 2023-08-22 NOTE — Telephone Encounter (Signed)
 Pt's wife Sallye Ober (DPR on file) was made aware and pt is needing an appt with Dr. Jena Gauss in April.

## 2023-12-06 ENCOUNTER — Encounter: Payer: Self-pay | Admitting: Internal Medicine

## 2024-01-18 ENCOUNTER — Other Ambulatory Visit: Payer: Self-pay | Admitting: Internal Medicine

## 2024-01-23 ENCOUNTER — Other Ambulatory Visit: Payer: Self-pay | Admitting: Internal Medicine

## 2024-03-14 ENCOUNTER — Other Ambulatory Visit: Payer: Self-pay | Admitting: Gastroenterology

## 2024-03-16 ENCOUNTER — Other Ambulatory Visit: Payer: Self-pay | Admitting: Gastroenterology

## 2024-04-23 ENCOUNTER — Other Ambulatory Visit: Payer: Self-pay | Admitting: Internal Medicine
